# Patient Record
Sex: Female | Born: 1955
Health system: Southern US, Community
[De-identification: ages and names within clinical notes are randomized; demographics above are authoritative.]

## PROBLEM LIST (undated history)

## (undated) DIAGNOSIS — E039 Hypothyroidism, unspecified: Secondary | ICD-10-CM

## (undated) DIAGNOSIS — M7751 Other enthesopathy of right foot: Secondary | ICD-10-CM

## (undated) DIAGNOSIS — E0590 Thyrotoxicosis, unspecified without thyrotoxic crisis or storm: Secondary | ICD-10-CM

## (undated) DIAGNOSIS — K219 Gastro-esophageal reflux disease without esophagitis: Secondary | ICD-10-CM

## (undated) DIAGNOSIS — F32A Depression, unspecified: Secondary | ICD-10-CM

## (undated) DIAGNOSIS — T7840XA Allergy, unspecified, initial encounter: Secondary | ICD-10-CM

## (undated) DIAGNOSIS — F411 Generalized anxiety disorder: Secondary | ICD-10-CM

## (undated) DIAGNOSIS — K2970 Gastritis, unspecified, without bleeding: Secondary | ICD-10-CM

## (undated) DIAGNOSIS — F419 Anxiety disorder, unspecified: Secondary | ICD-10-CM

## (undated) DIAGNOSIS — J45909 Unspecified asthma, uncomplicated: Secondary | ICD-10-CM

## (undated) DIAGNOSIS — M1990 Unspecified osteoarthritis, unspecified site: Secondary | ICD-10-CM

## (undated) DIAGNOSIS — F329 Major depressive disorder, single episode, unspecified: Secondary | ICD-10-CM

## (undated) DIAGNOSIS — M2020 Hallux rigidus, unspecified foot: Secondary | ICD-10-CM

## (undated) HISTORY — DX: Allergy, unspecified, initial encounter: T78.40XA

## (undated) HISTORY — PX: TONSILLECTOMY: SUR1361

## (undated) HISTORY — DX: Other enthesopathy of right foot and ankle: M77.51

## (undated) HISTORY — PX: COLONOSCOPY: SHX174

## (undated) HISTORY — DX: Unspecified osteoarthritis, unspecified site: M19.90

## (undated) HISTORY — PX: ROTATOR CUFF REPAIR: SHX139

## (undated) HISTORY — DX: Depression, unspecified: F32.A

## (undated) HISTORY — DX: Anxiety disorder, unspecified: F41.9

## (undated) HISTORY — DX: Gastro-esophageal reflux disease without esophagitis: K21.9

## (undated) HISTORY — DX: Generalized anxiety disorder: F41.1

## (undated) HISTORY — DX: Major depressive disorder, single episode, unspecified: F32.9

## (undated) HISTORY — PX: BREAST SURGERY: SHX581

## (undated) HISTORY — DX: Gastritis, unspecified, without bleeding: K29.70

## (undated) HISTORY — DX: Hypothyroidism, unspecified: E03.9

## (undated) HISTORY — DX: Unspecified asthma, uncomplicated: J45.909

---

## 1992-05-06 HISTORY — PX: BREAST EXCISIONAL BIOPSY: SUR124

## 1998-01-05 ENCOUNTER — Ambulatory Visit (HOSPITAL_COMMUNITY): Admission: RE | Admit: 1998-01-05 | Discharge: 1998-01-05 | Payer: Self-pay | Admitting: Internal Medicine

## 1999-05-23 ENCOUNTER — Other Ambulatory Visit: Admission: RE | Admit: 1999-05-23 | Discharge: 1999-05-23 | Payer: Self-pay | Admitting: *Deleted

## 2000-01-11 ENCOUNTER — Ambulatory Visit (HOSPITAL_COMMUNITY): Admission: RE | Admit: 2000-01-11 | Discharge: 2000-01-11 | Payer: Self-pay | Admitting: Internal Medicine

## 2000-06-04 ENCOUNTER — Encounter: Admission: RE | Admit: 2000-06-04 | Discharge: 2000-06-04 | Payer: Self-pay | Admitting: Obstetrics and Gynecology

## 2000-06-04 ENCOUNTER — Encounter: Payer: Self-pay | Admitting: Obstetrics and Gynecology

## 2000-07-23 ENCOUNTER — Other Ambulatory Visit: Admission: RE | Admit: 2000-07-23 | Discharge: 2000-07-23 | Payer: Self-pay | Admitting: Obstetrics and Gynecology

## 2001-08-20 ENCOUNTER — Encounter: Payer: Self-pay | Admitting: Obstetrics and Gynecology

## 2001-08-20 ENCOUNTER — Encounter: Admission: RE | Admit: 2001-08-20 | Discharge: 2001-08-20 | Payer: Self-pay | Admitting: Obstetrics and Gynecology

## 2001-09-14 ENCOUNTER — Other Ambulatory Visit: Admission: RE | Admit: 2001-09-14 | Discharge: 2001-09-14 | Payer: Self-pay | Admitting: Obstetrics and Gynecology

## 2002-09-06 ENCOUNTER — Encounter: Admission: RE | Admit: 2002-09-06 | Discharge: 2002-09-06 | Payer: Self-pay | Admitting: Obstetrics and Gynecology

## 2002-09-06 ENCOUNTER — Encounter: Payer: Self-pay | Admitting: Obstetrics and Gynecology

## 2002-09-21 ENCOUNTER — Other Ambulatory Visit: Admission: RE | Admit: 2002-09-21 | Discharge: 2002-09-21 | Payer: Self-pay | Admitting: Obstetrics and Gynecology

## 2002-10-07 ENCOUNTER — Encounter: Payer: Self-pay | Admitting: Endocrinology

## 2002-10-07 ENCOUNTER — Encounter: Admission: RE | Admit: 2002-10-07 | Discharge: 2002-10-07 | Payer: Self-pay | Admitting: Endocrinology

## 2003-04-21 ENCOUNTER — Ambulatory Visit (HOSPITAL_COMMUNITY): Admission: RE | Admit: 2003-04-21 | Discharge: 2003-04-21 | Payer: Self-pay | Admitting: Family Medicine

## 2003-05-16 ENCOUNTER — Ambulatory Visit (HOSPITAL_BASED_OUTPATIENT_CLINIC_OR_DEPARTMENT_OTHER): Admission: RE | Admit: 2003-05-16 | Discharge: 2003-05-16 | Payer: Self-pay | Admitting: Orthopedic Surgery

## 2003-05-18 ENCOUNTER — Encounter: Admission: RE | Admit: 2003-05-18 | Discharge: 2003-08-09 | Payer: Self-pay | Admitting: Orthopedic Surgery

## 2003-09-06 ENCOUNTER — Ambulatory Visit (HOSPITAL_COMMUNITY): Admission: RE | Admit: 2003-09-06 | Discharge: 2003-09-06 | Payer: Self-pay | Admitting: Endocrinology

## 2004-02-01 ENCOUNTER — Other Ambulatory Visit: Admission: RE | Admit: 2004-02-01 | Discharge: 2004-02-01 | Payer: Self-pay | Admitting: Obstetrics and Gynecology

## 2004-03-03 ENCOUNTER — Ambulatory Visit (HOSPITAL_COMMUNITY): Admission: RE | Admit: 2004-03-03 | Discharge: 2004-03-03 | Payer: Self-pay | Admitting: Orthopedic Surgery

## 2004-03-12 ENCOUNTER — Encounter: Admission: RE | Admit: 2004-03-12 | Discharge: 2004-04-23 | Payer: Self-pay | Admitting: Orthopedic Surgery

## 2004-08-13 ENCOUNTER — Encounter: Admission: RE | Admit: 2004-08-13 | Discharge: 2004-08-13 | Payer: Self-pay | Admitting: Obstetrics and Gynecology

## 2005-03-01 ENCOUNTER — Other Ambulatory Visit: Admission: RE | Admit: 2005-03-01 | Discharge: 2005-03-01 | Payer: Self-pay | Admitting: Obstetrics and Gynecology

## 2005-03-18 ENCOUNTER — Ambulatory Visit: Payer: Self-pay | Admitting: Gastroenterology

## 2005-04-03 ENCOUNTER — Ambulatory Visit: Payer: Self-pay | Admitting: Internal Medicine

## 2005-04-05 ENCOUNTER — Ambulatory Visit (HOSPITAL_COMMUNITY): Admission: RE | Admit: 2005-04-05 | Discharge: 2005-04-05 | Payer: Self-pay | Admitting: Obstetrics and Gynecology

## 2005-05-06 DIAGNOSIS — M7751 Other enthesopathy of right foot: Secondary | ICD-10-CM

## 2005-05-06 HISTORY — DX: Other enthesopathy of right foot and ankle: M77.51

## 2006-03-07 ENCOUNTER — Emergency Department (HOSPITAL_COMMUNITY): Admission: EM | Admit: 2006-03-07 | Discharge: 2006-03-07 | Payer: Self-pay | Admitting: Family Medicine

## 2006-05-21 ENCOUNTER — Other Ambulatory Visit: Admission: RE | Admit: 2006-05-21 | Discharge: 2006-05-21 | Payer: Self-pay | Admitting: Obstetrics and Gynecology

## 2006-05-30 ENCOUNTER — Encounter: Admission: RE | Admit: 2006-05-30 | Discharge: 2006-05-30 | Payer: Self-pay | Admitting: Obstetrics and Gynecology

## 2007-05-20 ENCOUNTER — Other Ambulatory Visit: Admission: RE | Admit: 2007-05-20 | Discharge: 2007-05-20 | Payer: Self-pay | Admitting: Obstetrics and Gynecology

## 2007-07-14 ENCOUNTER — Ambulatory Visit: Payer: Self-pay | Admitting: Sports Medicine

## 2007-07-14 DIAGNOSIS — M76829 Posterior tibial tendinitis, unspecified leg: Secondary | ICD-10-CM

## 2007-07-14 DIAGNOSIS — M7750 Other enthesopathy of unspecified foot: Secondary | ICD-10-CM

## 2008-01-08 ENCOUNTER — Encounter: Admission: RE | Admit: 2008-01-08 | Discharge: 2008-01-08 | Payer: Self-pay | Admitting: Obstetrics and Gynecology

## 2008-05-25 ENCOUNTER — Other Ambulatory Visit: Admission: RE | Admit: 2008-05-25 | Discharge: 2008-05-25 | Payer: Self-pay | Admitting: Obstetrics and Gynecology

## 2008-10-18 ENCOUNTER — Encounter: Admission: RE | Admit: 2008-10-18 | Discharge: 2008-10-18 | Payer: Self-pay | Admitting: Obstetrics and Gynecology

## 2009-01-26 ENCOUNTER — Ambulatory Visit: Payer: Self-pay | Admitting: Family Medicine

## 2009-02-01 ENCOUNTER — Ambulatory Visit: Payer: Self-pay | Admitting: Family Medicine

## 2009-07-11 ENCOUNTER — Ambulatory Visit: Payer: Self-pay | Admitting: Family Medicine

## 2009-08-10 ENCOUNTER — Ambulatory Visit: Payer: Self-pay | Admitting: Family Medicine

## 2009-10-04 ENCOUNTER — Ambulatory Visit (HOSPITAL_COMMUNITY): Payer: Self-pay | Admitting: Marriage and Family Therapist

## 2009-10-16 ENCOUNTER — Ambulatory Visit: Payer: Self-pay | Admitting: Physician Assistant

## 2009-10-16 ENCOUNTER — Ambulatory Visit (HOSPITAL_COMMUNITY): Payer: Self-pay | Admitting: Marriage and Family Therapist

## 2009-10-23 ENCOUNTER — Ambulatory Visit (HOSPITAL_COMMUNITY): Payer: Self-pay | Admitting: Marriage and Family Therapist

## 2009-11-07 ENCOUNTER — Ambulatory Visit (HOSPITAL_COMMUNITY): Payer: Self-pay | Admitting: Marriage and Family Therapist

## 2009-11-14 ENCOUNTER — Ambulatory Visit (HOSPITAL_COMMUNITY): Payer: Self-pay | Admitting: Marriage and Family Therapist

## 2009-11-22 ENCOUNTER — Ambulatory Visit (HOSPITAL_COMMUNITY): Payer: Self-pay | Admitting: Marriage and Family Therapist

## 2009-12-13 ENCOUNTER — Ambulatory Visit (HOSPITAL_COMMUNITY): Payer: Self-pay | Admitting: Marriage and Family Therapist

## 2009-12-21 ENCOUNTER — Ambulatory Visit (HOSPITAL_COMMUNITY): Payer: Self-pay | Admitting: Marriage and Family Therapist

## 2010-01-04 ENCOUNTER — Ambulatory Visit (HOSPITAL_COMMUNITY): Payer: Self-pay | Admitting: Marriage and Family Therapist

## 2010-01-16 ENCOUNTER — Ambulatory Visit: Payer: Self-pay | Admitting: Physician Assistant

## 2010-01-24 ENCOUNTER — Encounter (INDEPENDENT_AMBULATORY_CARE_PROVIDER_SITE_OTHER): Payer: Self-pay | Admitting: *Deleted

## 2010-02-19 ENCOUNTER — Ambulatory Visit (HOSPITAL_COMMUNITY): Payer: Self-pay | Admitting: Marriage and Family Therapist

## 2010-02-27 ENCOUNTER — Ambulatory Visit (HOSPITAL_COMMUNITY): Payer: Self-pay | Admitting: Marriage and Family Therapist

## 2010-03-22 ENCOUNTER — Ambulatory Visit (HOSPITAL_COMMUNITY): Payer: Self-pay | Admitting: Marriage and Family Therapist

## 2010-04-04 ENCOUNTER — Ambulatory Visit: Payer: Self-pay | Admitting: Family Medicine

## 2010-05-26 ENCOUNTER — Encounter: Payer: Self-pay | Admitting: Family Medicine

## 2010-05-28 ENCOUNTER — Encounter: Payer: Self-pay | Admitting: Obstetrics and Gynecology

## 2010-06-05 NOTE — Letter (Signed)
Summary: Colonoscopy Date Change Letter  [facility]  [address] [place], [place] [address]   Phone: [phone]  Fax: [phone]      January 24, 2010 MRN: [id number]   [facility] [address] [place], [place]  [address]   Dear Ms. Gavidia,   Previously you were recommended to have a repeat colonoscopy around this time. Your chart was recently reviewed by Dr. Lina Sar of [facility]. Follow up colonoscopy is now recommended in November 2013. This revised recommendation is based on current, nationally recognized guidelines for colorectal cancer screening and polyp surveillance. These guidelines are endorsed by the American Cancer Society, [employer] on Colorectal Cancer as well as numerous other major medical organizations.  Please understand that our recommendation assumes that you do not have any new symptoms such as bleeding, a change in bowel habits, anemia, or significant abdominal discomfort. If you do have any concerning GI symptoms or want to discuss the guideline recommendations, please call to arrange an office visit at your earliest convenience. Otherwise we will keep you in our reminder system and contact you 1-2 months prior to the date listed above to schedule your next colonoscopy.  Thank you,  Hedwig Morton. Juanda Chance, M.D.  [facility] [phone]

## 2010-08-22 ENCOUNTER — Ambulatory Visit (INDEPENDENT_AMBULATORY_CARE_PROVIDER_SITE_OTHER): Payer: Commercial Managed Care - PPO | Admitting: Family Medicine

## 2010-08-22 DIAGNOSIS — F411 Generalized anxiety disorder: Secondary | ICD-10-CM

## 2010-09-21 NOTE — Op Note (Signed)
NAME:  Theresa Schneider, Theresa Schneider                          ACCOUNT NO.:  [id number]   MEDICAL RECORD NO.:  [id number]                   PATIENT TYPE:  AMB   LOCATION:  DSC                                  FACILITY:  [facility]   PHYSICIAN:  Robert A. Thurston Hole, M.D.              DATE OF BIRTH:  January 27, 1956   DATE OF PROCEDURE:  05/16/2003  DATE OF DISCHARGE:                                 OPERATIVE REPORT   PREOPERATIVE DIAGNOSIS:  Right shoulder rotator cuff tear with partial  glenoid labrum tear and impingement.   POSTOPERATIVE DIAGNOSIS:  Right shoulder rotator cuff tear with partial  glenoid labrum tear and impingement.   PROCEDURE:  1. Right shoulder exam under anesthesia followed  by arthroscopic partial     labrum tear debridement.  2. Right shoulder arthroscopically assisted rotator cuff repair using     Arthrex suture anchor x1.  3. Right shoulder subacromial decompression.   SURGEON:  Elana Alm. Thurston Hole, M.D.   ASSISTANT:  Julien Girt, P.A.   ANESTHESIA:  General.   OPERATIVE TIME:  50 minutes.   COMPLICATIONS:  None.   INDICATIONS FOR PROCEDURE:  Theresa Schneider is a 55 year old woman who has had 6  months of increasing  right shoulder pain with examination  and MRI  documenting a partial versus complete rotator cuff tear, who has failed  conservative care and is now to undergo arthroscopy.   DESCRIPTION OF PROCEDURE:  Theresa Schneider is brought to the operating room on  May 16, 2003, after an interscalene block had been placed in the holding  area by anesthesia for postoperative pain control. She was placed on the  operating table in the supine position. After being placed under general  anesthesia, her right shoulder was examined under anesthesia. She had full  range of motion and her shoulder was stable on ligamentous examination. She  was then placed in a beachchair position and her shoulder and arm were  prepped using sterile Duraprep and draped using sterile technique.   Initially the arthroscopy was performed through a posterior arthroscopic  portal. The arthroscope with a pump attachment was placed and through and  anterior portal, an arthroscopic probe was placed. On initial inspection the  articular cartilage and the glenohumeral joint were intact. the anterior  labrum had a partial tear as well as the posterior labrum, partial tear 25%  which was debrided. The superior labrum, biceps tendon anchor were intact.  The biceps tendon was intact. The inferior labrum and anterior inferior  glenohumeral ligament complex were intact. The rotator cuff on the articular  side did not show evidence of a tear. The inferior capsular recess was free  of pathology.   The subacromial space was entered and a lateral arthroscopic portal was  made. A large amount of bursitis was resected. Impingement was noted and a  subacromial decompression was carried out, removing 6 mm of the undersurface  of the  anterior, anterolateral and anteromedial acromion and CA ligament  release carried out as well. The acromioclavicular joint was not disturbed.   The rotator cuff showed a complete tear on the bursal surface, and this was  partially debrided and then repaired arthroscopically with 1 Arthrex suture  anchor placed through an accessory portal. Each of the sutures in the anchor  was arthroscopically passed through the tear in the rotator cuff and then  arthroscopically tied down, thus repairing the cuff completely back down to  the greater tuberosity. The shoulder could be brought through a full range  of motion with no impingement on the repair after this was done.   At this point it was felt that all pathology had been satisfactorily  addressed. The instruments were removed. The portal was closed with 3-0  nylon suture. Sterile dressings and a sling were applied. The patient was  awakened and taken to the recovery room in stable condition.   FOLLOW UP:  Theresa Schneider will be  followed  as an outpatient on Percocet  and  Naprosyn with early physical therapy with passive range of motion only for 6  weeks, then active thereafter. I will see her back in the office in one week  for sutures out and follow up.                                               Robert A. Thurston Hole, M.D.    RAW/MEDQ  D:  05/16/2003  T:  05/16/2003  Job:  [id number]

## 2011-01-03 ENCOUNTER — Other Ambulatory Visit: Payer: Self-pay | Admitting: Obstetrics and Gynecology

## 2011-01-03 DIAGNOSIS — Z1231 Encounter for screening mammogram for malignant neoplasm of breast: Secondary | ICD-10-CM

## 2011-01-16 ENCOUNTER — Ambulatory Visit
Admission: RE | Admit: 2011-01-16 | Discharge: 2011-01-16 | Disposition: A | Discharge status: Home / Self Care | Payer: Commercial Managed Care - PPO | Source: Ambulatory Visit | Attending: Obstetrics and Gynecology | Admitting: Obstetrics and Gynecology

## 2011-01-16 ENCOUNTER — Telehealth: Payer: Self-pay | Admitting: Family Medicine

## 2011-01-16 DIAGNOSIS — Z1231 Encounter for screening mammogram for malignant neoplasm of breast: Secondary | ICD-10-CM

## 2011-01-16 DIAGNOSIS — E0500 Thyrotoxicosis with diffuse goiter without thyrotoxic crisis or storm: Secondary | ICD-10-CM

## 2011-01-16 MED ORDER — SYNTHROID 88 MCG PO TABS: 88.0000 ug | ORAL_TABLET | Freq: Every day | ORAL | Status: DC

## 2011-01-17 ENCOUNTER — Telehealth: Payer: Self-pay | Admitting: Family Medicine

## 2011-01-17 NOTE — Telephone Encounter (Signed)
Received refill request from [facility] for pt's liothyronine sodium 25 mcg, Dr.Knapp did not authorize refill as Dr.South rx'd this medication and she should be under the care of him for her hypothyroidism. Left message for patient regarding this info. She has an appt scheduled with Dr.Knapp in October-if this appt is only for thyroid concerns she should schedule appt with Dr.South as we do not do her thyroid care per Dr.Knapp.

## 2011-01-17 NOTE — Telephone Encounter (Signed)
She has been under the care of Dr. Evlyn Kanner for her hypothyroidism.  We don't Rx her thyroid meds, and this one wasn't even listed at her last OV in chart (08/2010). Refill not authorized

## 2011-01-17 NOTE — Telephone Encounter (Signed)
Dr.Knapp this refill request was in my inbox. Patient is scheduled for appt with you 02/20/2011. I do not see this med listed in her med list, so I was unsure if it was ok to fill or not. Thanks.

## 2011-01-23 ENCOUNTER — Ambulatory Visit (INDEPENDENT_AMBULATORY_CARE_PROVIDER_SITE_OTHER): Payer: Commercial Managed Care - PPO | Admitting: Family Medicine

## 2011-01-23 ENCOUNTER — Encounter: Payer: Self-pay | Admitting: Family Medicine

## 2011-01-23 VITALS — BP 110/70 | HR 73 | Wt 121.0 lb

## 2011-01-23 DIAGNOSIS — E0500 Thyrotoxicosis with diffuse goiter without thyrotoxic crisis or storm: Secondary | ICD-10-CM

## 2011-01-23 DIAGNOSIS — Z79899 Other long term (current) drug therapy: Secondary | ICD-10-CM

## 2011-01-23 DIAGNOSIS — E039 Hypothyroidism, unspecified: Secondary | ICD-10-CM

## 2011-01-23 LAB — CBC WITH DIFFERENTIAL/PLATELET
Eosinophils Absolute: 0.1 10*3/uL (ref 0.0–0.7)
Eosinophils Relative: 1 % (ref 0–5)
Hemoglobin: 13.5 g/dL (ref 12.0–15.0)
MCH: 28.4 pg (ref 26.0–34.0)
MCHC: 32.1 g/dL (ref 30.0–36.0)
Neutro Abs: 4.9 10*3/uL (ref 1.7–7.7)
Platelets: 372 10*3/uL (ref 150–400)
RBC: 4.76 MIL/uL (ref 3.87–5.11)
RDW: 13.3 % (ref 11.5–15.5)
WBC: 8.4 10*3/uL (ref 4.0–10.5)

## 2011-01-23 LAB — COMPREHENSIVE METABOLIC PANEL
ALT: 18 U/L (ref 0–35)
AST: 25 U/L (ref 0–37)
Glucose, Bld: 78 mg/dL (ref 70–99)
Potassium: 4.6 mEq/L (ref 3.5–5.3)
Sodium: 139 mEq/L (ref 135–145)
Total Bilirubin: 0.4 mg/dL (ref 0.3–1.2)
Total Protein: 6.6 g/dL (ref 6.0–8.3)

## 2011-01-23 LAB — LIPID PANEL
Cholesterol: 194 mg/dL (ref 0–200)
HDL: 73 mg/dL (ref >39)
Total CHOL/HDL Ratio: 2.7 Ratio
Triglycerides: 67 mg/dL (ref <150)
VLDL: 13 mg/dL (ref 0–40)

## 2011-01-23 NOTE — Progress Notes (Signed)
  Subjective:    Patient ID: Theresa Schneider, female    DOB: Jul 10, 1955, 55 y.o.   MRN: [id number]  HPI She is here for a consultation. She has a history of Graves' disease dating back several decades. She has been on thyroid replacement however has not had any of this checked in several years. She has been taking care of her husband who has been dealing with cancer. She has no particular concerns or complaints. 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   Review of Systems     Objective:   Physical Exam alert and in no distress. Tympanic membranes and canals are normal. Throat is clear. Tonsils are normal. Neck is supple without adenopathy or thyromegaly. Cardiac exam shows a regular sinus rhythm without murmurs or gallops. Lungs are clear to auscultation. Skin is normal. DTRs are normal.       Assessment & Plan:   1. Hypothyroid  CBC w/Diff, Comprehensive metabolic panel, Lipid Profile, TSH  2. Encounter for long-term (current) use of other medications  CBC w/Diff, Comprehensive metabolic panel, Lipid Profile, TSH   I will place her back on Synthroid based on the blood work. She will get a flu shot at work.

## 2011-01-24 ENCOUNTER — Telehealth: Payer: Self-pay

## 2011-01-24 MED ORDER — LEVOTHYROXINE SODIUM 88 MCG PO TABS: 88.0000 ug | ORAL_TABLET | Freq: Every day | ORAL | Status: DC

## 2011-01-24 NOTE — Telephone Encounter (Signed)
Called pt to inform her of labs

## 2011-01-24 NOTE — Progress Notes (Signed)
Addended by: Ronnald Nian on: 01/24/2011 01:22 PM   Modules accepted: Orders

## 2011-01-29 NOTE — Telephone Encounter (Signed)
DONE

## 2011-02-19 ENCOUNTER — Encounter: Payer: Self-pay | Admitting: Family Medicine

## 2011-02-20 ENCOUNTER — Encounter: Payer: Self-pay | Admitting: Family Medicine

## 2011-02-20 ENCOUNTER — Ambulatory Visit (INDEPENDENT_AMBULATORY_CARE_PROVIDER_SITE_OTHER): Payer: Commercial Managed Care - PPO | Admitting: Family Medicine

## 2011-02-20 DIAGNOSIS — F411 Generalized anxiety disorder: Secondary | ICD-10-CM

## 2011-02-20 DIAGNOSIS — M810 Age-related osteoporosis without current pathological fracture: Secondary | ICD-10-CM | POA: Insufficient documentation

## 2011-02-20 DIAGNOSIS — E039 Hypothyroidism, unspecified: Secondary | ICD-10-CM

## 2011-02-20 DIAGNOSIS — E890 Postprocedural hypothyroidism: Secondary | ICD-10-CM | POA: Insufficient documentation

## 2011-02-20 LAB — VITAMIN D 25 HYDROXY (VIT D DEFICIENCY, FRACTURES): Vit D, 25-Hydroxy: 55 ng/mL (ref 30–89)

## 2011-02-20 NOTE — Patient Instructions (Addendum)
Keep up the regular exercise.  We will contact you with your thyroid results.  If we are not changing the dose, then we can recheck in 3-4 months.  If we change the dose, it is important to have blood tests repeated in 6 weeks, to make sure the dose adjustment is correct.  Be consistent with the Lexapro dose.  Have the pharmacy contact us when you need refill

## 2011-02-20 NOTE — Progress Notes (Signed)
Patient presents for follow up on her thyroid medications.  The Cytomel was stopped about 6 weeks ago, Synthroid was maintained at same dose, and she presents for re-check of labs.  Has been very busy with daughter's recent wedding.  Feels a little tired overall.  Denies any skin or hair changes.  Has been dieting because of the wedding--weight loss was intentional.  No bowel changes.  Husband's cancer has been doing better, although he has lost a lot of weight.  Feels better and sleeping better overall.  Increased the Lexapro to 15mg  right before the wedding, otherwise mostly has been taking 10mg  (occasionally 15), and moods have been good.  Sometimes feels sleepy in the mornings--? From medication vs from thyroid.  Up at least twice a night to void (drinks a lot of water, late in the day).  Still tired, even with drinking a lot of coffee.  Recently started exercising again, and exercise has helped her energy some, but still more fatigued than she normally feels.  Dr. Tresa Res would like a PTH level checked.  She has osteoporosis and is considering Prolia injections every 6 months. She brings in Rx to get labs drawn for PTH.  Past Medical History  Diagnosis Date  . GAD (generalized anxiety disorder)   . Grave's disease     s/p I-131 treatment  . Allergy     SEASONAL/PERENNIAL  . Osteoporosis 2009  . Hypothyroidism     (since I-131 treatment)    Past Surgical History  Procedure Date  . Tonsillectomy   . Rotator cuff repair     History   Social History  . Marital Status: Married    Spouse Name: N/A    Number of Children: N/A  . Years of Education: N/A   Occupational History  . Not on file.   Social History Main Topics  . Smoking status: Never Smoker   . Smokeless tobacco: Never Used  . Alcohol Use: Yes  . Drug Use: No  . Sexually Active: Not on file   Other Topics Concern  . 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 Arthritis Mother     OSTEO    Current outpatient prescriptions:b complex vitamins tablet, Take 2 tablets by mouth daily.  , Disp: , Rfl: ;  calcium-vitamin D (OSCAL WITH D) 500-200 MG-UNIT per tablet, Take 1 tablet by mouth 2 (two) times daily.  , Disp: , Rfl: ;  cholecalciferol (VITAMIN D) 1000 UNITS tablet, Take 1,000 Units by mouth 2 (two) times daily.  , Disp: , Rfl: ;  escitalopram (LEXAPRO) 10 MG tablet, Take 10 mg by mouth daily.  , Disp: , Rfl:  levothyroxine (SYNTHROID) 88 MCG tablet, Take 1 tablet (88 mcg total) by mouth daily., Disp: 90 tablet, Rfl: 3;  Multiple Vitamins-Minerals (MULTIVITAMIN WITH MINERALS) tablet, Take 1 tablet by mouth 2 (two) times daily.  , Disp: , Rfl:   Allergies  Allergen Reactions  . Codeine Phosphate    ROS:  See HPI.  Denies fevers, URI symptoms, headaches, chest pain.  + fatigue.  Denies bowel changes, skin/hair changes.  Slight intentional weight loss.  Denies joint pains, skin rash or other concerns  PHYSICAL EXAM: BP 130/80  Pulse 72  Ht 5\' 2"  (1.575 m)  Wt 124 lb (56.246 kg)  BMI 22.68 kg/m2 Well developed, pleasant female in no distress HEENT: PERRL, EOMI, conjunctiva  clear Neck: no lymphadenopathy, thyromegaly Heart: regular rate and rhythm without murmur Lungs: clear bilaterally Extremities: no edema Skin: no rash Psych: normal mood, affect, hygiene and grooming  ASSESSMENT/PLAN:  1. Unspecified hypothyroidism  TSH  2. Osteoporosis  Vitamin D 25 hydroxy, PTH, Intact and Calcium  3. Anxiety state, unspecified     Hypothyroidism--having significant fatigue.  She prefers to increase thyroid dose (even if TSH is within normal range, if there is "wiggle room"), as long as not making her hyperthyroid and putting her bones at risk. Await TSH results to determine dose.  Osteoporosis--previously took Rx Vitamin D (years ago) for low Vitamin D levels.  Taking supplements now (2000 IU daily)--hasn't  had it checked in a while.  Re-check today, along with the PTH requested by Dr. Tresa Res  Depression/anxiety--overall doing well. Discussed the importance of maintaining a consistent dose of Lexapro.  Pharmacy to contact us when refill is needed.  Plans to get flu shot at hospital next week

## 2011-02-21 ENCOUNTER — Telehealth: Payer: Self-pay | Admitting: Family Medicine

## 2011-02-21 ENCOUNTER — Other Ambulatory Visit: Payer: Self-pay | Admitting: Family Medicine

## 2011-02-21 DIAGNOSIS — R7989 Other specified abnormal findings of blood chemistry: Secondary | ICD-10-CM

## 2011-02-21 DIAGNOSIS — E079 Disorder of thyroid, unspecified: Secondary | ICD-10-CM

## 2011-02-21 MED ORDER — LEVOTHYROXINE SODIUM 112 MCG PO TABS: 112.0000 ug | ORAL_TABLET | Freq: Every day | ORAL | Status: DC

## 2011-02-21 NOTE — Telephone Encounter (Signed)
dt ?

## 2011-02-21 NOTE — Progress Notes (Signed)
Phone pt work & Scientist, research (medical) re labs

## 2011-02-21 NOTE — Progress Notes (Signed)
Faxed labs to Dr. Tresa Res

## 2011-05-06 ENCOUNTER — Other Ambulatory Visit: Payer: Self-pay | Admitting: Family Medicine

## 2011-05-07 DIAGNOSIS — K2970 Gastritis, unspecified, without bleeding: Secondary | ICD-10-CM

## 2011-05-07 HISTORY — DX: Gastritis, unspecified, without bleeding: K29.70

## 2011-06-10 ENCOUNTER — Other Ambulatory Visit: Payer: Self-pay | Admitting: Family Medicine

## 2011-06-10 ENCOUNTER — Encounter: Payer: Self-pay | Admitting: Family Medicine

## 2011-06-10 ENCOUNTER — Ambulatory Visit (INDEPENDENT_AMBULATORY_CARE_PROVIDER_SITE_OTHER): Payer: Commercial Managed Care - PPO | Admitting: Family Medicine

## 2011-06-10 VITALS — BP 112/72 | HR 64 | Ht 62.0 in | Wt 127.0 lb

## 2011-06-10 DIAGNOSIS — F411 Generalized anxiety disorder: Secondary | ICD-10-CM

## 2011-06-10 DIAGNOSIS — R51 Headache: Secondary | ICD-10-CM

## 2011-06-10 DIAGNOSIS — E039 Hypothyroidism, unspecified: Secondary | ICD-10-CM

## 2011-06-10 DIAGNOSIS — F458 Other somatoform disorders: Secondary | ICD-10-CM

## 2011-06-10 MED ORDER — CITALOPRAM HYDROBROMIDE 20 MG PO TABS: 20.0000 mg | ORAL_TABLET | Freq: Every day | ORAL | Status: DC

## 2011-06-10 NOTE — Progress Notes (Signed)
Subjective:    Patient ID: Theresa Schneider, female    DOB: 07-18-1955, 56 y.o.   MRN: [id number]  Chief complaint: currently taking Lexapro and needs to change due to insurance (has list of preferred). She has frequent HA's, think Lexapro made them worse-would like to make sure that alternative does not make her gain weight or cause HA as side effect  HPI  She was started on Lexapro around the time that her husband was diagnosed with cancer, approx 02/2010.  Moods have been okay.  Now needs to change for insurance/formulary reasons.  Saw Dr. Lucianne Muss when she wasn't feeling well, related to her thyroid.  She felt very badly during the transition from Cytomel to Synthroid, with mainly fatigue. She reports that she had TSH checked, and was told that she should continue the dose of 112 mcg.  This was just around Christmas.  She doesn't plan to follow up there.  Feeling better now.    She is short-staffed at work, waking up a few nights/week and working in the middle of the night (waking at 2:30 and working the rest of the night). Sometimes goes to bed as early at 8:30, but admits that she isn't getting enough sleep.  Having headaches daily x 2 months. Originally thought it was related to stress, weather changes, but now has them most days. Sleep pattern has changed--going to bed earlier, but waking up early to get work done.  Thinks she isn't getting enough sleep.  No longer getting exercise because she is too busy, working too much.  Her headaches are at both temples, and across forehead and behind eyes. Can't remember if she had eye exam last year.  Sometimes wake up with headache, lowgrade, most days.  Wakes up with headache, sometimes stays lowgrade throughout the day, other times it gets worse during the day.  Takes BC powder and it helps most of the time, but doesn't take it every day. She denies allergy symptoms, but taking allergy shots. +grinds teeth, wears nightguard at night.  Some nausea with  the headaches, no vomiting; denies visual changes  Past Medical History  Diagnosis Date  . GAD (generalized anxiety disorder)   . Grave's disease     s/p I-131 treatment  . Allergy     SEASONAL/PERENNIAL  . Osteoporosis 2009  . Hypothyroidism     (since I-131 treatment)    Past Surgical History  Procedure Date  . Tonsillectomy   . Rotator cuff repair     History   Social History  . Marital Status: Married    Spouse Name: N/A    Number of Children: N/A  . Years of Education: N/A   Occupational History  . Not on file.   Social History Main Topics  . Smoking status: Never Smoker   . Smokeless tobacco: Never Used  . Alcohol Use: Yes  . Drug Use: No  . Sexually Active: Not on file   Other Topics Concern  . 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 Arthritis Mother     OSTEO   Current Outpatient Prescriptions on File Prior to Visit  Medication Sig Dispense Refill  . calcium-vitamin D (OSCAL WITH D) 500-200 MG-UNIT per tablet Take 1 tablet by mouth 2 (two) times daily.        . cholecalciferol (VITAMIN D) 1000 UNITS tablet Take 1,000 Units by mouth  2 (two) times daily.        . Multiple Vitamins-Minerals (MULTIVITAMIN WITH MINERALS) tablet Take 1 tablet by mouth 2 (two) times daily.        Marland Kitchen SYNTHROID 112 MCG tablet TAKE 1 TABLET BY MOUTH DAILY  30 tablet  0  . b complex vitamins tablet Take 2 tablets by mouth daily.          Allergies  Allergen Reactions  . Codeine Phosphate Nausea And Vomiting   Review of Systems Denies fevers, diarrhea, rare constipation.  Energy is okay.  No hair loss.  No weight changes.  Denies chest pain, shortness of breath. See HPI     Objective:   Physical Exam BP 112/72  Pulse 64  Ht 5\' 2"  (1.575 m)  Wt 127 lb (57.607 kg)  BMI 23.23 kg/m2 Well developed, pleasant female, in no distress HEENT:  PERRL, EOMI, conjunctiva clear.     Tender bilateral temporalis muscles.  Temporal arteries nontender.  Sinuses nontender Neck: no lymphadenopathy or mass Psych: normal mood, affect, hygiene and grooming Skin: no rash      Assessment & Plan:   1. Anxiety state, unspecified  citalopram (CELEXA) 20 MG tablet  2. Headache    3. Bruxism (teeth grinding)    4. Unspecified hypothyroidism      Depression--change to celexa.  Reviewed possible side effects, possibility that it may not be as effective, possibly needing to increase dose in future. Likely will have no problems with the change  Headaches--contributed by stress and teeth grinding.  Trial of Aleve, heat, stress reduction techniques, regular exercise.  Hypothyroidism:  She will get lab results from Dr. Lucianne Muss, to help determine when TSH needs to be repeated here again

## 2011-06-10 NOTE — Patient Instructions (Addendum)
Headaches--contributed by stress and teeth grinding.  Trial of Aleve, heat, stress reduction techniques.  Try and limit coffee to 1-2 cups daily, and ensure getting at least 7 hours of sleep nightly.  Drink plenty of non-caffeinated beverages.  Please try and send copies of labs from Dr. Lucianne Muss, so that we can figure out when you are due to have TSH checked again.  Follow up in 4 weeks if ongoing problems with headaches and/or moods, to discuss potentially changing medications.

## 2011-07-05 ENCOUNTER — Telehealth: Payer: Self-pay | Admitting: Internal Medicine

## 2011-07-05 DIAGNOSIS — E039 Hypothyroidism, unspecified: Secondary | ICD-10-CM

## 2011-07-05 MED ORDER — SYNTHROID 112 MCG PO TABS: 112.0000 ug | ORAL_TABLET | Freq: Every day | ORAL | Status: DC

## 2011-07-05 NOTE — Telephone Encounter (Signed)
done

## 2011-10-14 ENCOUNTER — Other Ambulatory Visit (HOSPITAL_COMMUNITY): Payer: Self-pay | Admitting: Orthopedic Surgery

## 2011-10-23 ENCOUNTER — Encounter (HOSPITAL_COMMUNITY): Payer: Self-pay | Admitting: Pharmacy Technician

## 2011-10-29 NOTE — Pre-Procedure Instructions (Signed)
20 SAMORIA FEDORKO  10/29/2011   Your procedure is scheduled on:  July 3rd  Report to [facility] at 0630 AM.  Call this number if you have problems the morning of surgery: [phone]   Remember:   Do not eat food or drink:After Midnight.  Take these medicines the morning of surgery with A SIP OF WATER: albuterol, synthroid   Do not wear jewelry, make-up or nail polish.  Do not wear lotions, powders, or perfumes.  Do not shave 48 hours prior to surgery. Men may shave face and neck.  Do not bring valuables to the hospital.  Contacts, dentures or bridgework may not be worn into surgery.  Leave suitcase in the car. After surgery it may be brought to your room.  For patients admitted to the hospital, checkout time is 11:00 AM the day of discharge.   Patients discharged the day of surgery will not be allowed to drive home.  Special Instructions: CHG Shower Use Special Wash: 1/2 bottle night before surgery and 1/2 bottle morning of surgery.   Please read over the following fact sheets that you were given: Pain Booklet, Coughing and Deep Breathing, MRSA Information and Surgical Site Infection Prevention

## 2011-10-30 ENCOUNTER — Inpatient Hospital Stay (HOSPITAL_COMMUNITY)
Admission: RE | Admit: 2011-10-30 | Discharge: 2011-10-30 | Payer: Commercial Managed Care - PPO | Source: Ambulatory Visit

## 2011-10-31 ENCOUNTER — Encounter (HOSPITAL_COMMUNITY)
Admission: RE | Admit: 2011-10-31 | Discharge: 2011-10-31 | Disposition: A | Discharge status: Home / Self Care | Payer: 59 | Source: Ambulatory Visit | Attending: Orthopedic Surgery | Admitting: Orthopedic Surgery

## 2011-10-31 ENCOUNTER — Encounter (HOSPITAL_COMMUNITY): Payer: Self-pay

## 2011-10-31 HISTORY — DX: Thyrotoxicosis, unspecified without thyrotoxic crisis or storm: E05.90

## 2011-10-31 HISTORY — DX: Hallux rigidus, unspecified foot: M20.20

## 2011-10-31 LAB — CBC
HCT: 40.8 % (ref 36.0–46.0)
Hemoglobin: 13.5 g/dL (ref 12.0–15.0)
MCHC: 33.1 g/dL (ref 30.0–36.0)
MCV: 89.5 fL (ref 78.0–100.0)
RDW: 13.6 % (ref 11.5–15.5)

## 2011-10-31 LAB — COMPREHENSIVE METABOLIC PANEL
Alkaline Phosphatase: 54 U/L (ref 39–117)
BUN: 13 mg/dL (ref 6–23)
Chloride: 104 mEq/L (ref 96–112)
Creatinine, Ser: 0.69 mg/dL (ref 0.50–1.10)
GFR calc Af Amer: >90 mL/min (ref >90)
Glucose, Bld: 82 mg/dL (ref 70–99)
Potassium: 4.1 mEq/L (ref 3.5–5.1)
Total Bilirubin: 0.3 mg/dL (ref 0.3–1.2)
Total Protein: 7.4 g/dL (ref 6.0–8.3)

## 2011-10-31 LAB — PROTIME-INR
INR: 0.99 (ref 0.00–1.49)
Prothrombin Time: 13.3 seconds (ref 11.6–15.2)

## 2011-10-31 LAB — APTT: aPTT: 39 seconds — ABNORMAL HIGH (ref 24–37)

## 2011-10-31 NOTE — Progress Notes (Signed)
Last ekg 15 yrs ago

## 2011-10-31 NOTE — Progress Notes (Signed)
Occ. Dysrhythmia with graves disease. Pt stated resolved now.  Will do ekg

## 2011-11-05 MED ORDER — CEFAZOLIN SODIUM-DEXTROSE 2-3 GM-% IV SOLR
2.0000 g | INTRAVENOUS | Status: AC
  Administered 2011-11-06: 2 g via INTRAVENOUS
  Filled 2011-11-05: qty 50

## 2011-11-06 ENCOUNTER — Encounter (HOSPITAL_COMMUNITY)
Admission: RE | Disposition: A | Discharge status: Home / Self Care | Payer: Self-pay | Source: Ambulatory Visit | Attending: Orthopedic Surgery

## 2011-11-06 ENCOUNTER — Encounter (HOSPITAL_COMMUNITY): Payer: Self-pay | Admitting: Anesthesiology

## 2011-11-06 ENCOUNTER — Ambulatory Visit (HOSPITAL_COMMUNITY)
Admission: RE | Admit: 2011-11-06 | Discharge: 2011-11-06 | Disposition: A | Discharge status: Home / Self Care | Payer: 59 | Source: Ambulatory Visit | Attending: Orthopedic Surgery | Admitting: Orthopedic Surgery

## 2011-11-06 ENCOUNTER — Ambulatory Visit (HOSPITAL_COMMUNITY): Payer: 59 | Admitting: Anesthesiology

## 2011-11-06 DIAGNOSIS — M2020 Hallux rigidus, unspecified foot: Secondary | ICD-10-CM | POA: Insufficient documentation

## 2011-11-06 DIAGNOSIS — Z01812 Encounter for preprocedural laboratory examination: Secondary | ICD-10-CM | POA: Insufficient documentation

## 2011-11-06 DIAGNOSIS — Z01810 Encounter for preprocedural cardiovascular examination: Secondary | ICD-10-CM | POA: Insufficient documentation

## 2011-11-06 HISTORY — PX: OTHER SURGICAL HISTORY: SHX169

## 2011-11-06 SURGERY — AMPUTATION DIGIT
Anesthesia: General | Site: Foot | Laterality: Right | Wound class: Clean

## 2011-11-06 MED ORDER — ONDANSETRON HCL 4 MG/2ML IJ SOLN
INTRAMUSCULAR | Status: DC | PRN
  Administered 2011-11-06: 4 mg via INTRAVENOUS

## 2011-11-06 MED ORDER — OXYCODONE HCL 5 MG PO TABS: 5.0000 mg | ORAL_TABLET | Freq: Once | ORAL | Status: DC | PRN

## 2011-11-06 MED ORDER — HYDROCODONE-ACETAMINOPHEN 5-500 MG PO TABS
1.0000 | ORAL_TABLET | Freq: Four times a day (QID) | ORAL | Status: AC | PRN
Start: 2011-11-06 — End: 2011-11-16

## 2011-11-06 MED ORDER — MIDAZOLAM HCL 2 MG/2ML IJ SOLN: 0.5000 mg | INTRAMUSCULAR | Status: DC | PRN

## 2011-11-06 MED ORDER — FENTANYL CITRATE 0.05 MG/ML IJ SOLN: 50.0000 ug | INTRAMUSCULAR | Status: DC | PRN

## 2011-11-06 MED ORDER — DEXAMETHASONE SODIUM PHOSPHATE 4 MG/ML IJ SOLN
INTRAMUSCULAR | Status: DC | PRN
  Administered 2011-11-06: 4 mg via INTRAVENOUS

## 2011-11-06 MED ORDER — PROPOFOL 10 MG/ML IV EMUL
INTRAVENOUS | Status: DC | PRN
  Administered 2011-11-06: 200 mg via INTRAVENOUS

## 2011-11-06 MED ORDER — HYDROMORPHONE HCL PF 1 MG/ML IJ SOLN: 0.2500 mg | INTRAMUSCULAR | Status: DC | PRN

## 2011-11-06 MED ORDER — 0.9 % SODIUM CHLORIDE (POUR BTL) OPTIME
TOPICAL | Status: DC | PRN
  Administered 2011-11-06: 1000 mL

## 2011-11-06 MED ORDER — DROPERIDOL 2.5 MG/ML IJ SOLN
INTRAMUSCULAR | Status: DC | PRN
  Administered 2011-11-06: 0.625 mg via INTRAVENOUS

## 2011-11-06 MED ORDER — ACETAMINOPHEN 10 MG/ML IV SOLN
INTRAVENOUS | Status: DC | PRN
  Administered 2011-11-06: 1000 mg via INTRAVENOUS

## 2011-11-06 MED ORDER — ARTIFICIAL TEARS OP OINT
TOPICAL_OINTMENT | OPHTHALMIC | Status: DC | PRN
  Administered 2011-11-06: 1 via OPHTHALMIC

## 2011-11-06 MED ORDER — LIDOCAINE HCL (PF) 1 % IJ SOLN
INTRAMUSCULAR | Status: AC
  Filled 2011-11-06: qty 30

## 2011-11-06 MED ORDER — LACTATED RINGERS IV SOLN
INTRAVENOUS | Status: DC | PRN
  Administered 2011-11-06: 08:00:00 via INTRAVENOUS

## 2011-11-06 MED ORDER — FENTANYL CITRATE 0.05 MG/ML IJ SOLN
INTRAMUSCULAR | Status: DC | PRN
  Administered 2011-11-06 (×2): 25 ug via INTRAVENOUS
  Administered 2011-11-06 (×2): 50 ug via INTRAVENOUS

## 2011-11-06 MED ORDER — LIDOCAINE HCL (CARDIAC) 20 MG/ML IV SOLN
INTRAVENOUS | Status: DC | PRN
  Administered 2011-11-06: 50 mg via INTRAVENOUS

## 2011-11-06 MED ORDER — ACETAMINOPHEN 10 MG/ML IV SOLN
INTRAVENOUS | Status: AC
  Filled 2011-11-06: qty 100

## 2011-11-06 MED ORDER — MIDAZOLAM HCL 5 MG/5ML IJ SOLN
INTRAMUSCULAR | Status: DC | PRN
  Administered 2011-11-06: 2 mg via INTRAVENOUS

## 2011-11-06 MED ORDER — METOCLOPRAMIDE HCL 5 MG/ML IJ SOLN: 10.0000 mg | Freq: Once | INTRAMUSCULAR | Status: DC | PRN

## 2011-11-06 MED ORDER — LIDOCAINE HCL 1 % IJ SOLN
INTRAMUSCULAR | Status: DC | PRN
  Administered 2011-11-06: 10 mL

## 2011-11-06 SURGICAL SUPPLY — 59 items
BANDAGE GAUZE 4  KLING STR (GAUZE/BANDAGES/DRESSINGS) IMPLANT
BANDAGE GAUZE ELAST BULKY 4 IN (GAUZE/BANDAGES/DRESSINGS) ×1 IMPLANT
BLADE AVERAGE 25X9 (BLADE) ×1 IMPLANT
BLADE MINI RND TIP GREEN BEAV (BLADE) IMPLANT
BNDG CMPR 9X4 STRL LF SNTH (GAUZE/BANDAGES/DRESSINGS) ×1
BNDG COHESIVE 1X5 TAN STRL LF (GAUZE/BANDAGES/DRESSINGS) IMPLANT
BNDG COHESIVE 4X5 TAN STRL (GAUZE/BANDAGES/DRESSINGS) ×1 IMPLANT
BNDG COHESIVE 6X5 TAN STRL LF (GAUZE/BANDAGES/DRESSINGS) IMPLANT
BNDG ESMARK 4X9 LF (GAUZE/BANDAGES/DRESSINGS) ×2 IMPLANT
BNDG GAUZE STRTCH 6 (GAUZE/BANDAGES/DRESSINGS) IMPLANT
CANISTER SUCTION 2500CC (MISCELLANEOUS) ×1 IMPLANT
CLOTH BEACON ORANGE TIMEOUT ST (SAFETY) ×2 IMPLANT
CORDS BIPOLAR (ELECTRODE) IMPLANT
COVER SURGICAL LIGHT HANDLE (MISCELLANEOUS) ×2 IMPLANT
CUFF TOURNIQUET SINGLE 18IN (TOURNIQUET CUFF) IMPLANT
CUFF TOURNIQUET SINGLE 24IN (TOURNIQUET CUFF) IMPLANT
CUFF TOURNIQUET SINGLE 34IN LL (TOURNIQUET CUFF) IMPLANT
CUFF TOURNIQUET SINGLE 44IN (TOURNIQUET CUFF) IMPLANT
DRAPE U-SHAPE 47X51 STRL (DRAPES) ×2 IMPLANT
DRSG ADAPTIC 3X8 NADH LF (GAUZE/BANDAGES/DRESSINGS) IMPLANT
DRSG EMULSION OIL 3X3 NADH (GAUZE/BANDAGES/DRESSINGS) ×2 IMPLANT
DRSG PAD ABDOMINAL 8X10 ST (GAUZE/BANDAGES/DRESSINGS) ×2 IMPLANT
DURAPREP 26ML APPLICATOR (WOUND CARE) ×2 IMPLANT
ELECT REM PT RETURN 9FT ADLT (ELECTROSURGICAL) ×2
ELECTRODE REM PT RTRN 9FT ADLT (ELECTROSURGICAL) ×1 IMPLANT
GAUZE SPONGE 2X2 8PLY STRL LF (GAUZE/BANDAGES/DRESSINGS) IMPLANT
GLOVE BIOGEL PI IND STRL 6.5 (GLOVE) IMPLANT
GLOVE BIOGEL PI IND STRL 9 (GLOVE) ×1 IMPLANT
GLOVE BIOGEL PI INDICATOR 6.5 (GLOVE) ×1
GLOVE BIOGEL PI INDICATOR 9 (GLOVE) ×1
GLOVE SS BIOGEL STRL SZ 6.5 (GLOVE) ×1 IMPLANT
GLOVE SUPERSENSE BIOGEL SZ 6.5 (GLOVE) ×1
GLOVE SURG ORTHO 9.0 STRL STRW (GLOVE) ×2 IMPLANT
GOWN PREVENTION PLUS XLARGE (GOWN DISPOSABLE) ×2 IMPLANT
GOWN SRG XL XLNG 56XLVL 4 (GOWN DISPOSABLE) ×1 IMPLANT
GOWN STRL NON-REIN XL XLG LVL4 (GOWN DISPOSABLE) ×2
KIT BASIN OR (CUSTOM PROCEDURE TRAY) ×2 IMPLANT
KIT ROOM TURNOVER OR (KITS) ×2 IMPLANT
MANIFOLD NEPTUNE II (INSTRUMENTS) IMPLANT
NDL HYPO 25GX1X1/2 BEV (NEEDLE) IMPLANT
NEEDLE 22X1 1/2 (OR ONLY) (NEEDLE) ×1 IMPLANT
NEEDLE HYPO 25GX1X1/2 BEV (NEEDLE) IMPLANT
NS IRRIG 1000ML POUR BTL (IV SOLUTION) ×2 IMPLANT
PACK ORTHO EXTREMITY (CUSTOM PROCEDURE TRAY) ×2 IMPLANT
PAD ARMBOARD 7.5X6 YLW CONV (MISCELLANEOUS) ×4 IMPLANT
PAD CAST 4YDX4 CTTN HI CHSV (CAST SUPPLIES) IMPLANT
PADDING CAST COTTON 4X4 STRL (CAST SUPPLIES)
SPECIMEN JAR SMALL (MISCELLANEOUS) ×1 IMPLANT
SPONGE GAUZE 2X2 STER 10/PKG (GAUZE/BANDAGES/DRESSINGS)
SPONGE GAUZE 4X4 12PLY (GAUZE/BANDAGES/DRESSINGS) ×1 IMPLANT
SPONGE LAP 18X18 X RAY DECT (DISPOSABLE) ×1 IMPLANT
SUCTION FRAZIER TIP 10 FR DISP (SUCTIONS) IMPLANT
SUT ETHILON 2 0 PSLX (SUTURE) ×2 IMPLANT
SUT VIC AB 2-0 FS1 27 (SUTURE) ×1 IMPLANT
SYR CONTROL 10ML LL (SYRINGE) ×1 IMPLANT
TOWEL OR 17X24 6PK STRL BLUE (TOWEL DISPOSABLE) ×2 IMPLANT
TOWEL OR 17X26 10 PK STRL BLUE (TOWEL DISPOSABLE) ×2 IMPLANT
TUBE CONNECTING 12X1/4 (SUCTIONS) ×2 IMPLANT
WATER STERILE IRR 1000ML POUR (IV SOLUTION) ×1 IMPLANT

## 2011-11-06 NOTE — Op Note (Signed)
OPERATIVE REPORT  DATE OF SURGERY: 11/06/2011  PATIENT:  Theresa Schneider,  56 y.o. female  PRE-OPERATIVE DIAGNOSIS:  Right Great Toe Hallux Rigidus  POST-OPERATIVE DIAGNOSIS:  Right Great Toe Hallux Rigidus  PROCEDURE:  Procedure(s): Cheilectomy right great toe  SURGEON:  Surgeon(s): Nadara Mustard, MD  ANESTHESIA:   local and general  EBL:  Minimal ML  SPECIMEN:  No Specimen  TOURNIQUET:  * Missing tourniquet times found for documented tourniquets in log:  [id number] *  PROCEDURE DETAILS: Patient is a 56 year old woman with hallux rigidus of right great toe she has pain with activities of daily living has failed conservative treatment and presents at this time for surgical intervention. Risks and benefits were discussed including infection neurovascular injury persistent pain recurrent stiffness need for additional surgery. Patient states she understands and wished to proceed at this time. Description of procedure patient was brought to the OR and underwent a general anesthetic. After adequate levels and anesthesia obtained patient's right lower extremity was prepped using DuraPrep and draped in the sterile field. An Esmarch was used the ankle for tourniquet control. A medial longitudinal incision was made over the MTP joint. This was carried down a football shaped the retinaculum was ellipsed from the plantar aspect. There is synovitis within the joint and a synovectomy was performed. Ostectomy followed by a cheilectomy was performed patient's dorsiflexion went from 10 of dorsiflexion 70. The wound was irrigated with normal saline the retinaculum was closed 0 Vicryl the skin was closed using 2-0 nylon wound is covered with Adaptic orthopedic sponges AB dressing Kerlix and Coban. Patient was extubated taken the PACU in stable condition. Local infiltration of 10 cc 1% lidocaine plain. Esmarch time of the ankle approximately 15 minutes.  PLAN OF CARE: Discharge to home after PACU  PATIENT  DISPOSITION:  PACU - hemodynamically stable.   Nadara Mustard, MD 11/06/2011 9:11 AM

## 2011-11-06 NOTE — Anesthesia Preprocedure Evaluation (Signed)
Anesthesia Evaluation  Patient identified by MRN, date of birth, ID band Patient awake    Reviewed: Allergy & Precautions, H&P , NPO status , Patient's Chart, lab work & pertinent test results, reviewed documented beta blocker date and time   Airway Mallampati: II TM Distance: >3 FB Neck ROM: full    Dental   Pulmonary asthma ,          Cardiovascular negative cardio ROS      Neuro/Psych PSYCHIATRIC DISORDERS Anxiety  Neuromuscular disease    GI/Hepatic negative GI ROS, Neg liver ROS,   Endo/Other  Hypothyroidism Hyperthyroidism   Renal/GU negative Renal ROS  negative genitourinary   Musculoskeletal   Abdominal   Peds  Hematology negative hematology ROS (+)   Anesthesia Other Findings See surgeon's H&P   Reproductive/Obstetrics negative OB ROS                           Anesthesia Physical Anesthesia Plan  ASA: II  Anesthesia Plan: General   Post-op Pain Management:    Induction: Intravenous  Airway Management Planned: LMA  Additional Equipment:   Intra-op Plan:   Post-operative Plan: Extubation in OR  Informed Consent: I have reviewed the patients History and Physical, chart, labs and discussed the procedure including the risks, benefits and alternatives for the proposed anesthesia with the patient or authorized representative who has indicated his/her understanding and acceptance.   Dental Advisory Given  Plan Discussed with: CRNA and Surgeon  Anesthesia Plan Comments:         Anesthesia Quick Evaluation

## 2011-11-06 NOTE — Anesthesia Postprocedure Evaluation (Signed)
Anesthesia Post Note  Patient: Theresa Schneider  Procedure(s) Performed: Procedure(s) (LRB): AMPUTATION DIGIT (Right)  Anesthesia type: General  Patient location: PACU  Post pain: Pain level controlled  Post assessment: Patient's Cardiovascular Status Stable  Last Vitals:  Filed Vitals:   11/06/11 0915  BP:   Pulse:   Temp: 36.1 C  Resp:     Post vital signs: Reviewed and stable  Level of consciousness: alert  Complications: No apparent anesthesia complications

## 2011-11-06 NOTE — Anesthesia Procedure Notes (Signed)
Procedure Name: LMA Insertion Date/Time: 11/06/2011 8:34 AM Performed by: Luster Landsberg Pre-anesthesia Checklist: Patient identified, Emergency Drugs available, Suction available and Patient being monitored Patient Re-evaluated:Patient Re-evaluated prior to inductionOxygen Delivery Method: Circle system utilized Preoxygenation: Pre-oxygenation with 100% oxygen Ventilation: Mask ventilation without difficulty LMA: LMA inserted LMA Size: 4.0 Number of attempts: 1 Placement Confirmation: ETT inserted through vocal cords under direct vision and breath sounds checked- equal and bilateral Tube secured with: Tape Dental Injury: Teeth and Oropharynx as per pre-operative assessment

## 2011-11-06 NOTE — Progress Notes (Signed)
Orthopedic Tech Progress Note Patient Details:  Theresa Schneider 09-11-1955 [id number]  Ortho Devices Type of Ortho Device: Crutches Ortho Device/Splint Interventions: Application   Delorean Knutzen T 11/06/2011, 10:01 AM

## 2011-11-06 NOTE — Transfer of Care (Signed)
Immediate Anesthesia Transfer of Care Note  Patient: Theresa Schneider  Procedure(s) Performed: Procedure(s) (LRB): AMPUTATION DIGIT (Right)  Patient Location: PACU  Anesthesia Type: General  Level of Consciousness: awake, alert  and oriented  Airway & Oxygen Therapy: Patient Spontanous Breathing and Patient connected to nasal cannula oxygen  Post-op Assessment: Report given to PACU RN, Post -op Vital signs reviewed and stable and Patient moving all extremities  Post vital signs: Reviewed and stable  Complications: No apparent anesthesia complications

## 2011-11-06 NOTE — H&P (Signed)
Theresa Schneider is an 56 y.o. female.   Chief Complaint: Pain right great toe MTP joint. HPI: Patient is a-year-old woman who has hallux rigidus of the right great toe. She has pain with activities of daily living has failed conservative care and presents at this time for surgical intervention.  Past Medical History  Diagnosis Date  . GAD (generalized anxiety disorder)   . Allergy     SEASONAL/PERENNIAL  . Osteoporosis 2009  . Hypothyroidism     (since I-131 treatment)  . Hallux rigidus   . Hyperthyroidism      hx graves disease. dr Catha Brow       Past Surgical History  Procedure Date  . Tonsillectomy   . Rotator cuff repair   . Breast surgery     fibra anomony    Family History  Problem Relation Age of Onset  . Arthritis Mother     OSTEO   Social History:  reports that she has never smoked. She has never used smokeless tobacco. She reports that she drinks alcohol. She reports that she does not use illicit drugs.  Allergies:  Allergies  Allergen Reactions  . Codeine Phosphate Nausea And Vomiting  . Latex Itching    senitivity    No prescriptions prior to admission    No results found for this or any previous visit (from the past 48 hour(s)). No results found.  Review of Systems  All other systems reviewed and are negative.    There were no vitals taken for this visit. Physical Exam  Patient's right foot is neurovascularly intact good pulses she only has 10 range of motion of the MTP joint of the great toe with bony spurs dorsally. Assessment/Plan Assessment: Hallux rigidus right great toe MTP joint.  Plan: We'll plan for cheilectomy. Risks and benefits of surgery were discussed including infection neurovascular injury persistent pain DVT need for additional surgery. Patient states she understands and wished to proceed at this time.  Mickaela Starlin V 11/06/2011, 6:04 AM

## 2011-11-08 ENCOUNTER — Encounter (HOSPITAL_COMMUNITY): Payer: Self-pay | Admitting: Orthopedic Surgery

## 2011-11-27 ENCOUNTER — Ambulatory Visit (INDEPENDENT_AMBULATORY_CARE_PROVIDER_SITE_OTHER): Payer: 59 | Admitting: Medical

## 2011-11-27 ENCOUNTER — Encounter: Payer: Self-pay | Admitting: Medical

## 2011-11-27 VITALS — BP 116/76 | HR 60 | Temp 96.4°F | Resp 16 | Wt 127.0 lb

## 2011-11-27 DIAGNOSIS — T148XXA Other injury of unspecified body region, initial encounter: Secondary | ICD-10-CM

## 2011-11-27 DIAGNOSIS — W57XXXA Bitten or stung by nonvenomous insect and other nonvenomous arthropods, initial encounter: Secondary | ICD-10-CM

## 2011-11-27 MED ORDER — AMOXICILLIN-POT CLAVULANATE 875-125 MG PO TABS: 1.0000 | ORAL_TABLET | Freq: Two times a day (BID) | ORAL | Status: AC

## 2011-11-27 NOTE — Patient Instructions (Addendum)
Begin Benadryl OTC 1/2 - 1 tablet two to three times daily for bite and itching.  Begin OTC steroid cream such as hydrocortisone topically to the bite 2-3 times daily.  If worse signs of infection such as worsening redness, hardness, hot or warm to touch, red streaking, fever, nausea, then begin the Augmentin.

## 2011-11-27 NOTE — Progress Notes (Signed)
Subjective: Here for c/o insect bite.  Was sitting on her deck in the afternoon 2 days ago and felt a bite to her left knee.  Didn't see the actual insect, but since then has gotten a raised red somewhat blistered area.  Never had similar insect bite in the past.  The area is not painful or hard or warm, but is itchy.  Wanted this checked out as she is going out of town tomorrow for class reunion.  Used some Witch Hazel topically.  No other aggravating or relieving factors.   Objective: Gen: wd, wn, nad Skin: left lateral leg over tender with 1cm round erythematous base with raised lesion that is somewhat hard and appears bullous.  No induration, fluctuance, streaking, warmth.  Only slightly tender.    Assessment: Encounter Diagnosis  Name Primary?  . Insect bite Yes   Plan: Advised she keep area clean with soap and water, use Hydrocortisone topically 2-3 times daily, begin 1/2 -1 Benadryl OTC 2-3 times daily.  If worse signs of infection as we discussed, then begin Augmentin.  Script given.  If signs of eschar, ulceration, tissue breakdown, get rechecked right away.

## 2012-02-06 ENCOUNTER — Encounter: Payer: Self-pay | Admitting: Internal Medicine

## 2012-02-18 ENCOUNTER — Telehealth: Payer: Self-pay | Admitting: Internal Medicine

## 2012-02-18 ENCOUNTER — Other Ambulatory Visit: Payer: Self-pay | Admitting: Obstetrics and Gynecology

## 2012-02-18 DIAGNOSIS — Z1231 Encounter for screening mammogram for malignant neoplasm of breast: Secondary | ICD-10-CM

## 2012-02-18 NOTE — Telephone Encounter (Signed)
Patient received recall letter for colonoscopy. She had foot surgery in the summer and was put on antiinflammatory medications. She has stopped the medications but is still having GI upset. Does not feel good when she eats and when lying down. Scheduled patient with Mike Gip, PA on 02/20/12 at 9:00 AM.

## 2012-02-20 ENCOUNTER — Ambulatory Visit (INDEPENDENT_AMBULATORY_CARE_PROVIDER_SITE_OTHER): Payer: 59 | Admitting: Physician Assistant

## 2012-02-20 ENCOUNTER — Encounter: Payer: Self-pay | Admitting: Physician Assistant

## 2012-02-20 VITALS — BP 120/70 | HR 60 | Ht 62.75 in | Wt 130.8 lb

## 2012-02-20 DIAGNOSIS — R1013 Epigastric pain: Secondary | ICD-10-CM

## 2012-02-20 DIAGNOSIS — Z800 Family history of malignant neoplasm of digestive organs: Secondary | ICD-10-CM

## 2012-02-20 DIAGNOSIS — Z1211 Encounter for screening for malignant neoplasm of colon: Secondary | ICD-10-CM

## 2012-02-20 MED ORDER — ESOMEPRAZOLE MAGNESIUM 40 MG PO CPDR: 40.0000 mg | DELAYED_RELEASE_CAPSULE | Freq: Every day | ORAL | Status: DC

## 2012-02-20 MED ORDER — MOVIPREP 100 G PO SOLR: 1.0000 | Freq: Once | ORAL | Status: DC

## 2012-02-20 NOTE — Progress Notes (Signed)
Subjective:    Patient ID: Theresa Schneider, female    DOB: Aug 12, 1955, 56 y.o.   MRN: [id number]  HPI Theresa Schneider is a very nice 56 year old white female known to Dr. Lina Schneider from prior colonoscopy. She had screening colonoscopy done in 2006 which was a normal exam area she does have family history of colon cancer in her paternal grandfather, and her father has had multiple polyps. She comes in today to discuss followup colonoscopy . She has occasional problems with constipation but other than that has no lower GI complaints. She is no melena or hematochezia and no complaints of change of bowel habits. She had foot surgery in July of 2013 and after that had been taking to 4 Advil every day over at least to 6 week period and developed epigastric pain. The Aleve but continued to have the epigastric discomfort. She was then given a prescription anti-inflammatory by her orthopedist and that also bothered her stomach so she stopped it about 3 weeks ago she describes a pressure type of urination high in her epigastrium. She says she seems to feel best before she eats lunch and better if she eats smaller amounts. She is also had some heartburn symptoms with this with radiation of the pressure into her chest intermittently. He has not had any nausea or vomiting has no complaints of dysphagia or odynophagia her appetite is fine and her weight has been stable At this time she feels like she can function without any anti-inflammatories, she has not tried any over-the-counter acid blockers other than Tums.    Review of Systems  Constitutional: Negative.   HENT: Negative.   Eyes: Negative.   Respiratory: Negative.   Cardiovascular: Negative.   Gastrointestinal: Positive for abdominal pain.  Genitourinary: Negative.   Musculoskeletal: Negative.   Neurological: Negative.   Hematological: Negative.   Psychiatric/Behavioral: Negative.    Outpatient Prescriptions Prior to Visit  Medication Sig Dispense Refill  .  albuterol (PROVENTIL HFA;VENTOLIN HFA) 108 (90 BASE) MCG/ACT inhaler Inhale 2 puffs into the lungs every 6 (six) hours as needed. For shortness of breath      . calcium-vitamin D (OSCAL WITH D) 500-200 MG-UNIT per tablet Take 1 tablet by mouth 2 (two) times daily.        . cholecalciferol (VITAMIN D) 1000 UNITS tablet Take 1,000 Units by mouth 2 (two) times daily.        Marland Kitchen levothyroxine (SYNTHROID) 112 MCG tablet Take 112 mcg by mouth daily.      . Multiple Vitamins-Minerals (MULTIVITAMIN WITH MINERALS) tablet Take 1 tablet by mouth 2 (two) times daily.        Marland Kitchen PRESCRIPTION MEDICATION Inject 1 application into the muscle every 7 (seven) days. Allergy shots weekly      . b complex vitamins tablet Take 2 tablets by mouth daily.             Allergies  Allergen Reactions  . Codeine Phosphate Nausea And Vomiting  . Latex Itching    senitivity   Patient Active Problem List  Diagnosis  . TIBIALIS TENDINITIS  . OTHER ENTHESOPATHY OF ANKLE AND TARSUS  . Unspecified hypothyroidism  . Osteoporosis  . Anxiety state, unspecified   History   Social History  . Marital Status: Married    Spouse Name: N/A    Number of Children: 3  . Years of Education: N/A   Occupational History  . RADIOLOGY [employer]   Social History Main Topics  . Smoking status: Never Smoker   .  Smokeless tobacco: Never Used  . Alcohol Use: Yes     occ  . Drug Use: No  . Sexually Active: Not on file   Other Topics Concern  . Not on file   Social History Narrative   Teaches radiology.  [employer] employee    Objective:   Physical Exam well-developed white female in no acute distress, pleasant blood pressure 120/70 pulse 60 height 5 foot 2 weight 1:30. HEENT; nontraumatic normocephalic EOMI PERRLA sclera anicteric, Neck; supple no JVD, Cardiovascular; regular rate and rhythm with S1-S2 no murmur or gallop, Pulmonary; clear bilaterally, Abdomen ;soft basically nontender nondistended bowel sounds are active there  is no palpable mass or hepatosplenomegaly, Rectal exam not done, Extremities ;no clubbing cyanosis or edema, Psych; mood and affect normal and appropriate        Assessment & Plan:  #33 56 year old female due for followup colonoscopy for screening with positive family history of colon cancer in a grandparent. Colonoscopy 7 years ago was normal #2   2-3 month history of epigastric discomfort-area likely secondary to an NSAID-induced gastritis versus peptic ulcer disease. She is off of NSAIDs at this time  Plan; schedule for colonoscopy with Dr. Lina Schneider, procedure was discussed in detail with the patient and she is agreeable to proceed. Have advised her to remain off of NSAIDs and use Tylenol as needed for foot pain Start trial of Nexium 40 mg by mouth every morning x1 month. If her symptoms persist despite treatment with Nexium then would consider further workup to include EGD.

## 2012-02-20 NOTE — Progress Notes (Signed)
I have reviewed the note and agree with colonoscopy. Trial of PPI , hold NSAID's.

## 2012-02-20 NOTE — Patient Instructions (Addendum)
You have been scheduled for a colonoscopy with propofol. Please follow written instructions given to you at your visit today.  Please pick up your prep kit at the pharmacy within the next 1-3 days. If you use inhalers (even only as needed), please bring them with you on the day of your procedure.  We have given you samples of the following medication to take: Nexium. Please take one by mouth thirty minutes before breakfast every day for one month   We have sent the following medications to your pharmacy for you to pick up at your convenience: Moviprep

## 2012-02-21 NOTE — Progress Notes (Signed)
Reviewed and agree with management. Saleha Kalp D. Maeci Kalbfleisch, M.D., FACG  

## 2012-03-25 ENCOUNTER — Ambulatory Visit
Admission: RE | Admit: 2012-03-25 | Discharge: 2012-03-25 | Disposition: A | Discharge status: Home / Self Care | Payer: 59 | Source: Ambulatory Visit | Attending: Obstetrics and Gynecology | Admitting: Obstetrics and Gynecology

## 2012-03-25 ENCOUNTER — Ambulatory Visit: Payer: 59

## 2012-03-25 DIAGNOSIS — Z1231 Encounter for screening mammogram for malignant neoplasm of breast: Secondary | ICD-10-CM

## 2012-04-14 ENCOUNTER — Encounter: Payer: Self-pay | Admitting: Internal Medicine

## 2012-04-14 ENCOUNTER — Ambulatory Visit (AMBULATORY_SURGERY_CENTER): Payer: 59 | Admitting: Internal Medicine

## 2012-04-14 VITALS — BP 105/76 | HR 62 | Temp 97.8°F | Resp 24 | Ht 62.0 in | Wt 130.0 lb

## 2012-04-14 DIAGNOSIS — Z1211 Encounter for screening for malignant neoplasm of colon: Secondary | ICD-10-CM

## 2012-04-14 MED ORDER — SODIUM CHLORIDE 0.9 % IV SOLN: 500.0000 mL | INTRAVENOUS | Status: DC

## 2012-04-14 NOTE — Op Note (Signed)
[facility] [address] [place], [address]   COLONOSCOPY PROCEDURE REPORT  PATIENT: Theresa, Schneider  MR#: [id number] BIRTHDATE: 04/19/1956 , 56  yrs. old GENDER: Female ENDOSCOPIST: Hart Carwin, MD REFERRED BY:  Joselyn Arrow, M.D.  Meredeth Ide, M.D. PROCEDURE DATE:  04/14/2012 PROCEDURE:   Colonoscopy, screening ASA CLASS:   Class I INDICATIONS:Average risk patient for colon cancer and last colonoscopy 2006, paternal grandfather with colon cancer. MEDICATIONS: MAC sedation, administered by CRNA and propofol (Diprivan) 300mg  IV  DESCRIPTION OF PROCEDURE:   After the risks and benefits and of the procedure were explained, informed consent was obtained.  A digital rectal exam revealed no abnormalities of the rectum.    The LB [id number]  endoscope was introduced through the anus and advanced to the cecum, which was identified by both the appendix and ileocecal valve .  The quality of the prep was good, using MoviPrep .  The instrument was then slowly withdrawn as the colon was fully examined.     COLON FINDINGS: A normal appearing cecum, ileocecal valve, and appendiceal orifice were identified.  The ascending, hepatic flexure, transverse, splenic flexure, descending, sigmoid colon and rectum appeared unremarkable.  No polyps or cancers were seen. Retroflexed views revealed no abnormalities.     The scope was then withdrawn from the patient and the procedure completed.  COMPLICATIONS: There were no complications. ENDOSCOPIC IMPRESSION: Normal colon  RECOMMENDATIONS: High fiber diet   REPEAT EXAM: In 10 year(s)  for Colonoscopy.  cc:  _______________________________ eSignedHart Carwin, MD 04/14/2012 3:16 PM

## 2012-04-14 NOTE — Patient Instructions (Addendum)
YOU HAD AN ENDOSCOPIC PROCEDURE TODAY AT [facility]: Refer to the procedure report that was given to you for any specific questions about what was found during the examination.  If the procedure report does not answer your questions, please call your gastroenterologist to clarify.  If you requested that your care partner not be given the details of your procedure findings, then the procedure report has been included in a sealed envelope for you to review at your convenience later.  YOU SHOULD EXPECT: Some feelings of bloating in the abdomen. Passage of more gas than usual.  Walking can help get rid of the air that was put into your GI tract during the procedure and reduce the bloating. If you had a lower endoscopy (such as a colonoscopy or flexible sigmoidoscopy) you may notice spotting of blood in your stool or on the toilet paper. If you underwent a bowel prep for your procedure, then you may not have a normal bowel movement for a few days.  DIET: Your first meal following the procedure should be a light meal and then it is ok to progress to your normal diet.  A half-sandwich or bowl of soup is an example of a good first meal.  Heavy or fried foods are harder to digest and may make you feel nauseous or bloated.  Likewise meals heavy in dairy and vegetables can cause extra gas to form and this can also increase the bloating.  Drink plenty of fluids but you should avoid alcoholic beverages for 24 hours.  ACTIVITY: Your care partner should take you home directly after the procedure.  You should plan to take it easy, moving slowly for the rest of the day.  You can resume normal activity the day after the procedure however you should NOT DRIVE or use heavy machinery for 24 hours (because of the sedation medicines used during the test).    SYMPTOMS TO REPORT IMMEDIATELY: A gastroenterologist can be reached at any hour.  During normal business hours, 8:30 AM to 5:00 PM Monday through Friday,  call [phone].  After hours and on weekends, please call the GI answering service at [phone] who will take a message and have the physician on call contact you.   Following lower endoscopy (colonoscopy or flexible sigmoidoscopy):  Excessive amounts of blood in the stool  Significant tenderness or worsening of abdominal pains  Swelling of the abdomen that is new, acute  Fever of 100F or higher     FOLLOW UP: If any biopsies were taken you will be contacted by phone or by letter within the next 1-3 weeks.  Call your gastroenterologist if you have not heard about the biopsies in 3 weeks.  Our staff will call the home number listed on your records the next business day following your procedure to check on you and address any questions or concerns that you may have at that time regarding the information given to you following your procedure. This is a courtesy call and so if there is no answer at the home number and we have not heard from you through the emergency physician on call, we will assume that you have returned to your regular daily activities without incident.  SIGNATURES/CONFIDENTIALITY: You and/or your care partner have signed paperwork which will be entered into your electronic medical record.  These signatures attest to the fact that that the information above on your After Visit Summary has been reviewed and is understood.  Full responsibility of the  confidentiality of this discharge information lies with you and/or your care-partner.    Normal colonoscopy.   Recommend high fiber diet.

## 2012-04-14 NOTE — Progress Notes (Signed)
Patient did not experience any of the following events: a burn prior to discharge; a fall within the facility; wrong site/side/patient/procedure/implant event; or a hospital transfer or hospital admission upon discharge from the facility. (G8907) Patient did not have preoperative order for IV antibiotic SSI prophylaxis. (G8918)  

## 2012-04-14 NOTE — Progress Notes (Signed)
Pt. Has received Levsin .25mg  x 1 in recovery.  Has passed air. Abdomen soft. Continues to complain with abdominal cramps. Advised to try warm liquids, walk, and attempt knee chest position at home if cramps continue.

## 2012-04-15 ENCOUNTER — Telehealth: Payer: Self-pay

## 2012-04-15 NOTE — Telephone Encounter (Signed)
  Follow up Call-  Call back number 04/14/2012  Post procedure Call Back phone  # [phone] cell  Permission to leave phone message Yes     Patient questions:  Do you have a fever, pain , or abdominal swelling? no Pain Score  0 *  Have you tolerated food without any problems? yes  Have you been able to return to your normal activities? yes  Do you have any questions about your discharge instructions: Diet   no Medications  no Follow up visit  no  Do you have questions or concerns about your Care? no  Actions: * If pain score is 4 or above: No action needed, pain <4.

## 2012-04-28 ENCOUNTER — Encounter: Payer: Self-pay | Admitting: Internal Medicine

## 2012-07-07 ENCOUNTER — Encounter: Payer: Self-pay | Admitting: Obstetrics and Gynecology

## 2012-08-05 ENCOUNTER — Ambulatory Visit: Payer: Self-pay | Admitting: Obstetrics and Gynecology

## 2012-09-11 ENCOUNTER — Ambulatory Visit (INDEPENDENT_AMBULATORY_CARE_PROVIDER_SITE_OTHER): Payer: BC Managed Care – PPO | Admitting: Obstetrics and Gynecology

## 2012-09-11 ENCOUNTER — Encounter: Payer: Self-pay | Admitting: Obstetrics and Gynecology

## 2012-09-11 ENCOUNTER — Ambulatory Visit: Payer: Self-pay | Admitting: Obstetrics and Gynecology

## 2012-09-11 VITALS — BP 120/80 | Ht 62.5 in | Wt 125.0 lb

## 2012-09-11 DIAGNOSIS — Z733 Stress, not elsewhere classified: Secondary | ICD-10-CM

## 2012-09-11 DIAGNOSIS — N952 Postmenopausal atrophic vaginitis: Secondary | ICD-10-CM

## 2012-09-11 DIAGNOSIS — F439 Reaction to severe stress, unspecified: Secondary | ICD-10-CM

## 2012-09-11 DIAGNOSIS — Z01419 Encounter for gynecological examination (general) (routine) without abnormal findings: Secondary | ICD-10-CM

## 2012-09-11 DIAGNOSIS — Z0000 Encounter for general adult medical examination without abnormal findings: Secondary | ICD-10-CM

## 2012-09-11 DIAGNOSIS — N393 Stress incontinence (female) (male): Secondary | ICD-10-CM

## 2012-09-11 DIAGNOSIS — M810 Age-related osteoporosis without current pathological fracture: Secondary | ICD-10-CM

## 2012-09-11 LAB — POCT URINALYSIS DIPSTICK
Blood, UA: NEGATIVE
Nitrite, UA: NEGATIVE
Urobilinogen, UA: NEGATIVE
pH, UA: 5

## 2012-09-11 MED ORDER — ESTRADIOL 10 MCG VA TABS: 1.0000 | ORAL_TABLET | VAGINAL | Status: DC

## 2012-09-11 NOTE — Progress Notes (Signed)
Patient ID: Theresa Schneider, female   DOB: [birth year], 57 y.o.   MRN: [id number] 57 y.o.  Married  Caucasian female   [phone] here for annual exam.   Some vaginal dryness.  Not really using Vagifem tablets.  Forgets. Cystocele.  Voids often to avoid leakage.  Leaks if coughs and sneezes.  Saw Dr. Patsi Sears in the past.  Had urodynamic  testing.  Does not wear pads but empties often.   Due for Prolia injection.  Has changed insurance.  Has had three injections so far.  Due for bone density in September 2014.   Takes calcium and vitamin D.  Eats dairy products - 2 servings per day.   Stress in personal life.  Retired early from [employer], unexpected.  Now working for [employer].  Taking care of elderly parents.  Grandchild with health issues.  Feels like she is in the middle of all the problems.  Patient's last menstrual period was 05/06/2004.          Sexually active: no The current method of family planning is post menopausal status.    Exercising: occasional walking Last mammogram:  03/2012 [facility] Last pap smear: 07/2010 wnl: no HPV History of abnormal pap: Yes 30 years ago--no treatment-reverted to normal Smoking:no Alcohol: occasionally Last colonoscopy: 04/2012 wnl:Dr. Verlee Monte Brodie:next one due 10 years Last Bone Density:  01/2011 [facility]:osteoporosis Last tetanus shot: 2012 Last cholesterol check: unsure  Hgb: 13.5               Urine: Neg    Health Maintenance  Topic Date Due  . Pap Smear  10/27/1971  . Tetanus/tdap  10/26/1972  . Influenza Vaccine  01/04/2013  . Mammogram  03/25/2014  . Colonoscopy  04/14/2022    Family History  Problem Relation Age of Onset  . Arthritis Mother     OSTEO  . Heart disease Father   . Heart attack Father 75  . Colon polyps Father     beign  . Colon cancer Paternal Grandfather 30  . Cancer Maternal Grandfather     esophageal  . Esophageal cancer Maternal Grandfather 76  . Breast cancer Paternal Aunt   . Colon  polyps Paternal Aunt   . COPD Paternal Aunt   . Rectal cancer Neg Hx   . Stomach cancer Neg Hx     Patient Active Problem List   Diagnosis Date Noted  . Unspecified hypothyroidism 02/20/2011  . Osteoporosis 02/20/2011  . Anxiety state, unspecified 02/20/2011  . TIBIALIS TENDINITIS 07/14/2007  . OTHER ENTHESOPATHY OF ANKLE AND TARSUS 07/14/2007    Past Medical History  Diagnosis Date  . GAD (generalized anxiety disorder)   . Allergy     SEASONAL/PERENNIAL  . Osteoporosis 2009  . Hallux rigidus   . Hypothyroidism     (since I-131 treatment)  . Hyperthyroidism      hx graves disease. dr Lindie Spruce pcp     . Anxiety   . Asthma     allergy related  . Depression   . Tendinitis of right ankle 2007    Past Surgical History  Procedure Laterality Date  . Tonsillectomy    . Rotator cuff repair      right  . Hallux rigidus  11/06/2011  . Breast surgery      fibroadenoma, right    Allergies: Codeine phosphate and Latex  Current Outpatient Prescriptions  Medication Sig Dispense Refill  . albuterol (PROVENTIL HFA;VENTOLIN HFA) 108 (90 BASE) MCG/ACT inhaler Inhale  2 puffs into the lungs every 6 (six) hours as needed. For shortness of breath      . ASTEPRO 0.15 % SOLN       . b complex vitamins tablet Take 1 tablet by mouth once a week.      Marland Kitchen BEPREVE 1.5 % SOLN       . calcium-vitamin D (OSCAL WITH D) 500-200 MG-UNIT per tablet Take 1 tablet by mouth 2 (two) times daily.        . cholecalciferol (VITAMIN D) 1000 UNITS tablet Take 1,000 Units by mouth 2 (two) times daily.        Marland Kitchen denosumab (PROLIA) 60 MG/ML SOLN injection Inject 60 mg into the skin every 6 (six) months. Administer in upper arm, thigh, or abdomen      . levocetirizine (XYZAL) 5 MG tablet       . levothyroxine (SYNTHROID) 112 MCG tablet Take 112 mcg by mouth daily.      . montelukast (SINGULAIR) 10 MG tablet       . Multiple Vitamins-Minerals (MULTIVITAMIN WITH MINERALS) tablet Take 1 tablet by mouth 2 (two) times  daily.        Marland Kitchen PRESCRIPTION MEDICATION Inject 1 application into the muscle every 7 (seven) days. Allergy shots weekly      . QVAR 80 MCG/ACT inhaler       . esomeprazole (NEXIUM) 40 MG capsule Take 1 capsule (40 mg total) by mouth daily.  30 capsule  0   No current facility-administered medications for this visit.    ROS: Pertinent items are noted in HPI.  Social Hx:  Married.   Works for [employer]  Exam:    BP 120/80  Ht 5' 2.5" (1.588 m)  Wt 125 lb (56.7 kg)  BMI 22.48 kg/m2  LMP 05/06/2004   Wt Readings from Last 3 Encounters:  09/11/12 125 lb (56.7 kg)  04/14/12 130 lb (58.968 kg)  02/20/12 130 lb 12.8 oz (59.33 kg)     Ht Readings from Last 3 Encounters:  09/11/12 5' 2.5" (1.588 m)  04/14/12 5\' 2"  (1.575 m)  02/20/12 5' 2.75" (1.594 m)    General appearance: alert, cooperative and appears stated age Head: Normocephalic, without obvious abnormality, atraumatic Neck: no adenopathy, supple, symmetrical, trachea midline and thyroid not enlarged, symmetric, no tenderness/mass/nodules Lungs: clear to auscultation bilaterally Breasts: Inspection negative, No nipple retraction or dimpling, No nipple discharge or bleeding, No axillary or supraclavicular adenopathy, Normal to palpation without dominant masses Heart: regular rate and rhythm Abdomen: soft, non-tender; bowel sounds normal; no masses,  no organomegaly Extremities: extremities normal, atraumatic, no cyanosis or edema Skin: Skin color, texture, turgor normal. No rashes or lesions Lymph nodes: Cervical, supraclavicular, and axillary nodes normal. No abnormal inguinal nodes palpated Neurologic: Grossly normal   Pelvic: External genitalia:  no lesions              Urethra:  normal appearing urethra with no masses, tenderness or lesions              Bartholins and Skenes: normal                 Vagina: normal appearing vagina with normal color and discharge, no lesions.  Less than first degree  cystocele.  Fair Kegel.                Cervix: normal appearance              Pap taken: yes and  high risk HPV.        Bimanual Exam:  Uterus:  uterus is normal size, shape, consistency and nontender                                      Adnexa: normal adnexa in size, nontender and no masses                                      Rectovaginal: Confirms                                      Anus:  normal sphincter tone, no lesions  A: normal gyn exam Genuine stress incontinence Osteoporosis Atrophic vaginitis Situational stress     P: mammogram yearly pap smear and high risk HPV testing I discussed with patient kegel exercises and potential midurethral sling.  She will consider.    Bone Density in September 2014 Re-order Prolia for one year.  Will need to get prior authorization from new insurance company. Vagifem rx. Gave patient name and phone number of therapist, Vonna Kotyk to do cognitive therapy. return annually or prn     An After Visit Summary was printed and given to the patient.

## 2012-09-11 NOTE — Patient Instructions (Signed)

## 2012-09-12 LAB — COMPREHENSIVE METABOLIC PANEL
ALT: 14 U/L (ref 0–35)
AST: 19 U/L (ref 0–37)
Albumin: 4.2 g/dL (ref 3.5–5.2)
Calcium: 9.9 mg/dL (ref 8.4–10.5)
Chloride: 101 mEq/L (ref 96–112)
Potassium: 4.6 mEq/L (ref 3.5–5.3)
Sodium: 139 mEq/L (ref 135–145)
Total Protein: 6.9 g/dL (ref 6.0–8.3)

## 2012-09-12 LAB — LIPID PANEL
LDL Cholesterol: 100 mg/dL — ABNORMAL HIGH (ref 0–99)
VLDL: 42 mg/dL — ABNORMAL HIGH (ref 0–40)

## 2012-09-15 ENCOUNTER — Ambulatory Visit: Payer: Self-pay | Admitting: Obstetrics and Gynecology

## 2012-10-09 ENCOUNTER — Telehealth: Payer: Self-pay | Admitting: *Deleted

## 2012-10-09 NOTE — Telephone Encounter (Signed)
LEFT MESSAGE ON [facility] CELL # TO CALL CONCERNING [employer] INSURANCE VERIFICATION REQUEST FORM FAXED ON MAY 13TH. REFAXED FORM AGAIN TO FAX # ON FORM. SUE

## 2012-10-09 NOTE — Telephone Encounter (Signed)
INFORMATION [facility] WAS REQEUSTING GIVEN TO REPRESENTATIVE AT [facility]. STATED WOULD FAX FORM OF DECISION.

## 2012-10-09 NOTE — Telephone Encounter (Signed)
CALLED [facility] AND SPOKE TO REPRESENTATIVE. WAS TOLD FORM HAD BEEN RECEIVED . WAS NEEDING MORE INFORMATION CONCERNING PHARMACY BENEFIT AND PHARMACY. LEFT MESSAGE ON PATIENT CB# OF THIS AND TO RETURN OUR CALL TO OUR OFFICE.WITH THIS INFORMATION. SUE

## 2012-10-12 ENCOUNTER — Telehealth: Payer: Self-pay | Admitting: *Deleted

## 2012-10-12 NOTE — Telephone Encounter (Signed)
Theresa Schneider, Please make this patient an appoinment to see dr Edward Jolly to discuss options. It may benefit her to check her insurance preferred medication list before she comes in so she will have an idea of options for Dr Edward Jolly to select from.

## 2012-10-12 NOTE — Telephone Encounter (Signed)
Patient notified that pharmacy benefit manager advised that Prolia is not covered through her pharmacy benefit of [employer]. Explained that could be covered under her Medical Benefit @ hospital through [facility]. Patient  Unsure of what to do due to out of pocket expense to her. Explained to patient that would call [facility] to see if it was under Medical Benefit and let her know. Patient agrees to this.

## 2012-10-13 NOTE — Telephone Encounter (Signed)
LMOVM to call to discuss Prolia.  Her new insurance will not cover.  Needs appt. To discuss options with Dr. Edward Jolly.  Pt. Needs To call her insurance to find what preferred meds are.

## 2012-10-15 ENCOUNTER — Telehealth: Payer: Self-pay | Admitting: Obstetrics and Gynecology

## 2012-10-15 NOTE — Telephone Encounter (Signed)
Spoke with patient and advised new insurance will not cover Prolia.  Advised patient to contact her insurance company and find out What medications are on her formulary for osteoporosis therapy and come in to discuss alternate therapy with Dr. Edward Jolly.  Made Appointment with Dr. Edward Jolly for 10-30-12 7:30a.m.

## 2012-10-15 NOTE — Telephone Encounter (Signed)
Please see completed documentation on encounter of 10-12-12.

## 2012-10-15 NOTE — Telephone Encounter (Signed)
Patient states she is returning Amanda's call re: Prolia. Marchelle Folks will call patient back.

## 2012-10-30 ENCOUNTER — Ambulatory Visit (INDEPENDENT_AMBULATORY_CARE_PROVIDER_SITE_OTHER): Payer: BC Managed Care – PPO | Admitting: Obstetrics and Gynecology

## 2012-10-30 ENCOUNTER — Encounter: Payer: Self-pay | Admitting: Obstetrics and Gynecology

## 2012-10-30 VITALS — BP 120/82 | HR 76 | Resp 16 | Ht 62.5 in | Wt 125.0 lb

## 2012-10-30 DIAGNOSIS — M810 Age-related osteoporosis without current pathological fracture: Secondary | ICD-10-CM

## 2012-10-30 NOTE — Patient Instructions (Signed)
Osteoporosis Throughout your life, your body breaks down old bone and replaces it with new bone. As you get older, your body does not replace bone as quickly as it breaks it down. By the age of 30 years, most people begin to gradually lose bone because of the imbalance between bone loss and replacement. Some people lose more bone than others. Bone loss beyond a specified normal degree is considered osteoporosis.  Osteoporosis affects the strength and durability of your bones. The inside of the ends of your bones and your flat bones, like the bones of your pelvis, look like honeycomb, filled with tiny open spaces. As bone loss occurs, your bones become less dense. This means that the open spaces inside your bones become bigger and the walls between these spaces become thinner. This makes your bones weaker. Bones of a person with osteoporosis can become so weak that they can break (fracture) during minor accidents, such as a simple fall. CAUSES  The following factors have been associated with the development of osteoporosis:  Smoking.  Drinking more than 2 alcoholic drinks several days per week.  Long-term use of certain medicines:  Corticosteroids.  Chemotherapy medicines.  Thyroid medicines.  Antiepileptic medicines.  Gonadal hormone suppression medicine.  Immunosuppression medicine.  Being underweight.  Lack of physical activity.  Lack of exposure to the sun. This can lead to vitamin D deficiency.  Certain medical conditions:  Certain inflammatory bowel diseases, such as Crohn's disease and ulcerative colitis.  Diabetes.  Hyperthyroidism.  Hyperparathyroidism. RISK FACTORS Anyone can develop osteoporosis. However, the following factors can increase your risk of developing osteoporosis:  Gender Women are at higher risk than men.  Age Being older than 50 years increases your risk.  Ethnicity White and Asian people have an increased risk.  Weight Being extremely  underweight can increase your risk of osteoporosis.  Family history of osteoporosis Having a family member who has developed osteoporosis can increase your risk. SYMPTOMS  Usually, people with osteoporosis have no symptoms.  DIAGNOSIS  Signs during a physical exam that may prompt your caregiver to suspect osteoporosis include:  Decreased height. This is usually caused by the compression of the bones that form your spine (vertebrae) because they have weakened and become fractured.  A curving or rounding of the upper back (kyphosis). To confirm signs of osteoporosis, your caregiver may request a procedure that uses 2 low-dose X-ray beams with different levels of energy to measure your bone mineral density (dual-energy X-ray absorptiometry [DXA]). Also, your caregiver may check your level of vitamin D. TREATMENT  The goal of osteoporosis treatment is to strengthen bones in order to decrease the risk of bone fractures. There are different types of medicines available to help achieve this goal. Some of these medicines work by slowing the processes of bone loss. Some medicines work by increasing bone density. Treatment also involves making sure that your levels of calcium and vitamin D are adequate. PREVENTION  There are things you can do to help prevent osteoporosis. Adequate intake of calcium and vitamin D can help you achieve optimal bone mineral density. Regular exercise can also help, especially resistance and weight-bearing activities. If you smoke, quitting smoking is an important part of osteoporosis prevention. MAKE SURE YOU:  Understand these instructions.  Will watch your condition.  Will get help right away if you are not doing well or get worse. Document Released: 01/30/2005 Document Revised: 04/08/2012 Document Reviewed: 04/06/2011 Exit[employer] Patient Information 2014 [employer].  

## 2012-10-30 NOTE — Progress Notes (Signed)
Patient ID: Theresa Schneider, female   DOB: 08/18/1955, 57 y.o.   MRN: [id number]  Subjective  Patient here to discuss osteoporosis and treatment options. Insurance will not pay for Prolia. Has done some research about Forteo and believes it will not be paid for.  Patient has tried oral bisphosphonate Actonel and felt bad with GI symptoms but did not see improvement in the bone density. Also has difficulty going back to bed after taking medication.  Patient has a history of presumed gastritis after taking NSAIDs for left foot pain following surgery.   Sees Dr. Lucianne Muss endocrinology for hypothyroidism.    Takes Calcium supplements and vit D 1000 IU bid  Objetive  BMD 01/16/11 - Spine - T score was -2.8, Left hip T score was - 2.3 BMD 01/08/08 - Spine - T score was - 2.9, Left hip T score was - 2.4 BMD 10/07/02 - Spine T score was - 2.4,  Left hip T score was -1.6  Assessment  Osteoporosis  Plan  Check Vit D level today. Discussed moderation of ETOH use, no tobacco, weight bearing exercise. Patient will contact her insurance co to see what is on her preferred list for osteoporosis treatment. I discussed new bisphosphonate with less GI side effects. I also discussed Evista. If bone density in September shows worsening osteoporosis, will consult with Dr. Lucianne Muss.

## 2013-01-19 ENCOUNTER — Other Ambulatory Visit: Payer: Self-pay | Admitting: *Deleted

## 2013-01-19 DIAGNOSIS — E039 Hypothyroidism, unspecified: Secondary | ICD-10-CM

## 2013-01-27 ENCOUNTER — Other Ambulatory Visit (INDEPENDENT_AMBULATORY_CARE_PROVIDER_SITE_OTHER): Payer: BC Managed Care – PPO

## 2013-01-27 DIAGNOSIS — E039 Hypothyroidism, unspecified: Secondary | ICD-10-CM

## 2013-01-27 LAB — TSH: TSH: 1.93 u[IU]/mL (ref 0.35–5.50)

## 2013-01-29 ENCOUNTER — Ambulatory Visit: Payer: Self-pay | Admitting: Endocrinology

## 2013-02-03 ENCOUNTER — Ambulatory Visit (INDEPENDENT_AMBULATORY_CARE_PROVIDER_SITE_OTHER): Payer: BC Managed Care – PPO | Admitting: Family Medicine

## 2013-02-03 ENCOUNTER — Encounter: Payer: Self-pay | Admitting: Endocrinology

## 2013-02-03 ENCOUNTER — Encounter: Payer: Self-pay | Admitting: Family Medicine

## 2013-02-03 VITALS — BP 122/78 | HR 60 | Ht 62.0 in | Wt 123.0 lb

## 2013-02-03 DIAGNOSIS — F411 Generalized anxiety disorder: Secondary | ICD-10-CM

## 2013-02-03 DIAGNOSIS — Z8249 Family history of ischemic heart disease and other diseases of the circulatory system: Secondary | ICD-10-CM | POA: Insufficient documentation

## 2013-02-03 DIAGNOSIS — R079 Chest pain, unspecified: Secondary | ICD-10-CM

## 2013-02-03 DIAGNOSIS — E781 Pure hyperglyceridemia: Secondary | ICD-10-CM

## 2013-02-03 NOTE — Progress Notes (Signed)
Chief Complaint  Patient presents with  . stomach pain    possible stress related, cramps with tingling pain through-out of chest  and stomach, pt will get flu shot at work   Today, while driving to work she started feeling nauseated, some cramping in her stomach (upper stomach, somewhat on the right, then spread to epigastrium).  Then also started having some pain in her right lower molar.  Pain wasn't excruciating, described as "radiating pain".  Started in epigastrium, came up into the chest some, and then radiated out to both sides.  No radiation to arm, neck.  No shortness of breath with it, not diaphoretic.  No belching. No pleuritic chest pain.  Felt very different from any chest tightness related to asthma.  Symptoms self-resolved after about 15 minutes (while still driving to work). 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  She felt like maybe she needed inhaler, but didn't want to feel jittery so didn't use med.  She sometimes feels winded when talking a lot/teaching (ongoing); denies feeling short of breath today.  She has been feeling very stressed for about 6 months, and when she feels anxious (which has been more often) she feels a pressure in her chest. Started while driving to hospital (to see her dad), not with any exertion.  She walks occasionally, occasionally runs--no problems with chest pain with exercise.  Denies tachycardia, palpitations.  Her father had MI in May, with h/o MI at age 32.  He was also hospitalized in December (pancreatitis, needed cholecystectomy).  He had 2 stents placed in May, and had pacemaker put in recently. He is home now, in [place], and thinks he is now doing better.   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  She has been very stressed about making decisions re: her retirement. 59 year old mother-in-law's health is declining;  periodically stays with her, lives alone.  Dealing essentially with 3 aging parents.  Granddaughter with multiple food allergies (59 yo) causing severe eczema.  She has had stress tests twice in the past (once in her 43's with Dr. Myrtis Ser; another one about 10 years ago, prior to fibroadenoma removal--had echo and stress test)  Husband had diarrhea recently--he has sensitive stomach (not sure if a virus, of something else)  Chart reviewed--TG were >200 at her last check in May 2014 done by her GYN.  It was recommended that she follow a lowfat diet, and f/u here for recheck.  She reports that she was NOT fasting prior to those labs, had at least coffee with cream prior to the blood draw.  Past Medical History  Diagnosis Date  . GAD (generalized anxiety disorder)   . Allergy     SEASONAL/PERENNIAL  . Osteoporosis 2009  . Hallux rigidus   . Hypothyroidism     (since I-131 treatment)  . Hyperthyroidism      hx graves disease. dr Lindie Spruce pcp     . Anxiety   . Asthma     allergy related  . Depression   . Tendinitis of right ankle 2007  . Gastritis 2013    from NSAID's after foot surgery   Past Surgical History  Procedure Laterality Date  . Tonsillectomy    . Rotator cuff repair      right  . Hallux rigidus  11/06/2011  . Breast surgery      fibroadenoma, right   History  Social History  . Marital Status: Married    Spouse Name: N/A    Number of Children: 3  . Years of Education: N/A   Occupational History  . RADIOLOGY [place]   Social History Main Topics  . Smoking status: Never Smoker   . Smokeless tobacco: Never Used  . Alcohol Use: Yes     Comment: occ  . Drug Use: No  . Sexual Activity: Not Currently    Birth Control/ Protection: Post-menopausal   Other Topics Concern  . 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   Family History  Problem Relation Age of Onset  .  Arthritis Mother     OSTEO  . Hypothyroidism Mother   . Heart disease Father 94    first MI at 50; stents at 35; pacemaker at 73  . Heart attack Father 76  . Colon polyps Father     beign  . Colon cancer Paternal Grandfather 54  . Cancer Maternal Grandfather     esophageal  . Esophageal cancer Maternal Grandfather 36  . Breast cancer Paternal Aunt   . Colon polyps Paternal Aunt   . COPD Paternal Aunt   . Rectal cancer Neg Hx   . Stomach cancer Neg Hx   . Diabetes Brother     obese; pre-diabetic  . Hypothyroidism Sister   . Hypothyroidism Sister    Current outpatient prescriptions:albuterol (PROVENTIL HFA;VENTOLIN HFA) 108 (90 BASE) MCG/ACT inhaler, Inhale 2 puffs into the lungs every 6 (six) hours as needed. For shortness of breath, Disp: , Rfl: ;  ASTEPRO 0.15 % SOLN, , Disp: , Rfl: ;  b complex vitamins tablet, Take 1 tablet by mouth once a week., Disp: , Rfl: ;  BEPREVE 1.5 % SOLN, , Disp: , Rfl:  calcium-vitamin D (OSCAL WITH D) 500-200 MG-UNIT per tablet, Take 1 tablet by mouth 2 (two) times daily.  , Disp: , Rfl: ;  Estradiol 10 MCG TABS, Place 1 tablet (10 mcg total) vaginally 2 (two) times a week., Disp: 8 tablet, Rfl: 11;  levocetirizine (XYZAL) 5 MG tablet, , Disp: , Rfl: ;  levothyroxine (SYNTHROID) 112 MCG tablet, Take 112 mcg by mouth daily., Disp: , Rfl: ;  montelukast (SINGULAIR) 10 MG tablet, , Disp: , Rfl:  Multiple Vitamins-Minerals (MULTIVITAMIN WITH MINERALS) tablet, Take 1 tablet by mouth 2 (two) times daily.  , Disp: , Rfl: ;  PRESCRIPTION MEDICATION, Inject 1 application into the muscle every 7 (seven) days. Allergy shots weekly, Disp: , Rfl: ;  QVAR 80 MCG/ACT inhaler, , Disp: , Rfl: ;  cholecalciferol (VITAMIN D) 1000 UNITS tablet, Take 1,000 Units by mouth 2 (two) times daily.  , Disp: , Rfl:  denosumab (PROLIA) 60 MG/ML SOLN injection, Inject 60 mg into the skin every 6 (six) months. Administer in upper arm, thigh, or abdomen, Disp: , Rfl: ;  esomeprazole (NEXIUM)  40 MG capsule, Take 1 capsule (40 mg total) by mouth daily., Disp: 30 capsule, Rfl: 0 She is NOT taking Nexium or Prolia currently, and she stopped the Vitamin D supplement (as directed per GYN)  Allergies  Allergen Reactions  . Codeine Phosphate Nausea And Vomiting  . Latex Itching    senitivity   ROS:  Denies fevers, chills, URI symptoms.  Nausea resolved.  Last BM was yesterday, normal. No fevers.  Allergies not flaring now (on preventative meds).  Denies reflux symptoms.  She was at the beach this past weekend with girlfriends, where she drank ice tea, vodka and lemonade--had some GI symptoms for which she took Tums and it resolved. No bleeding/bruising, rash.  +stress, denies depression. See HPI.  No headaches, dizziness  PHYSICAL EXAM: BP 122/78  Pulse 60  Ht 5\' 2"  (1.575 m)  Wt 123 lb (55.792 kg)  BMI 22.49 kg/m2  LMP 05/06/2004  Very talkative, pleasant female in no distress.  Speaking easily in long sentences HEENT:  PERRL, EOMI, conjunctiva clear.  OP clear Neck: no lymphadenopathy, thyromegaly or mass Heart: regular rate and rhythm without murmur Lungs: clear bilaterally.  Good air movement. No wheezes, rales, ronchi Abdomen: soft, nontender, no organomegaly or mass Chest:  Chest wall is nontender Extremities: no edema Skin: no bruising or rashes noted Psych: normal mood, affect, hygiene and grooming. Normal eye contact, nonpressured speech Neuro: alert and oriented.  Cranial nerves intact.  Normal gait, strength  EKG: NSR.  Possible LAE.  Very slight PR depression, not significant.  No other ST-T abnormalities.  Compared to prior EKG from 2013--unchanged.  ASSESSMENT/PLAN:  Chest pain - Plan: EKG 12-Lead  Family history of premature CAD - Plan: Lipid panel, High sensitivity CRP  Hypertriglyceridemia - Plan: Lipid panel  Anxiety state, unspecified  Return for fasting lipids and hs-CRP.   Elevated TG at GYN in May likely not a concern--TG was just over 200, but  nonfasting.  Chest pain--DDx reviewed in detail (cardiac, respiratory/pulmonary, GI/reflux, MSK, stress). EKG is normal, prior negative w/u. Will keep track of symptoms, and if ongoing pain, may need referral back to cardiology.  She will f/u if ongoing symptoms of chest pain for further eval.  Will keep journal of symptoms, when they occur, if at rest, after eating, exertional, what makes symptoms resolve.  If epigastric pain, radiating into chest after eating, recommended trial of antacid to see if symptoms improve.  Declines flu shot today--pl[facility] to [facility]  All questions answered.  Visit >25 minutes, more than 1/2 spent counseling

## 2013-02-03 NOTE — Patient Instructions (Addendum)
Your EKG was normal, unchanged from previous EKG from last year. Schedule fasting labs. Keep track of recurrent symptoms--when they occurred, related to food, stress, exertion.  How long did it last, type of pain, what made it go away, etc. If having ongoing symptoms, follow-up here to discuss, and then we can decide if it sounds cardiac (and refer you back to cardiologist) vs GI vs other. Work on stress reduction techniques.

## 2013-02-03 NOTE — Progress Notes (Deleted)
Patient ID: Theresa Schneider, female   DOB: 06-Oct-1955, 57 y.o.   MRN: [id number]  Reason for Appointment:  Hypothyroidism, followup visit    History of Present Illness:   The hypothyroidism was first diagnosed in ***  Complaints are reported by the patient now are ***         The symptoms consistent with hypothyroidism are: fatigue,  can't seem to lose weight , cold sensitivity, difficulty concentrating , dry skin, hair loss.           The treatments that the patient has taken include Synthroid.           The response to therapy has been ***          Compliance with the medical regimen has been as prescribed with taking the tablet in the morning before breakfast.  No visits with results within 1 Week(s) from this visit. Latest known visit with results is:  Appointment on 01/27/2013  Component Date Value Range Status  . Free T4 01/27/2013 1.09  0.60 - 1.60 ng/dL Final  . TSH 65/78/4696 1.93  0.35 - 5.50 uIU/mL Final      Medication List       This list is accurate as of: 02/03/13  8:09 AM.  Always use your most recent med list.               albuterol 108 (90 BASE) MCG/ACT inhaler  Commonly known as:  PROVENTIL HFA;VENTOLIN HFA  Inhale 2 puffs into the lungs every 6 (six) hours as needed. For shortness of breath     ASTEPRO 0.15 % Soln  Generic drug:  Azelastine HCl     b complex vitamins tablet  Take 1 tablet by mouth once a week.     BEPREVE 1.5 % Soln  Generic drug:  Bepotastine Besilate     calcium-vitamin D 500-200 MG-UNIT per tablet  Commonly known as:  OSCAL WITH D  Take 1 tablet by mouth 2 (two) times daily.     cholecalciferol 1000 UNITS tablet  Commonly known as:  VITAMIN D  Take 1,000 Units by mouth 2 (two) times daily.     denosumab 60 MG/ML Soln injection  Commonly known as:  PROLIA  Inject 60 mg into the skin every 6 (six) months. Administer in upper arm, thigh, or abdomen     esomeprazole 40 MG capsule  Commonly known as:  NEXIUM  Take 1 capsule  (40 mg total) by mouth daily.     Estradiol 10 MCG Tabs vaginal tablet  Place 1 tablet (10 mcg total) vaginally 2 (two) times a week.     levocetirizine 5 MG tablet  Commonly known as:  XYZAL     montelukast 10 MG tablet  Commonly known as:  SINGULAIR     multivitamin with minerals tablet  Take 1 tablet by mouth 2 (two) times daily.     PRESCRIPTION MEDICATION  Inject 1 application into the muscle every 7 (seven) days. Allergy shots weekly     QVAR 80 MCG/ACT inhaler  Generic drug:  beclomethasone     SYNTHROID 112 MCG tablet  Generic drug:  levothyroxine  Take 112 mcg by mouth daily.        Past Medical History  Diagnosis Date  . GAD (generalized anxiety disorder)   . Allergy     SEASONAL/PERENNIAL  . Osteoporosis 2009  . Hallux rigidus   . Hypothyroidism     (since I-131 treatment)  . Hyperthyroidism  hx graves disease. dr Lindie Spruce pcp     . Anxiety   . Asthma     allergy related  . Depression   . Tendinitis of right ankle 2007    Past Surgical History  Procedure Laterality Date  . Tonsillectomy    . Rotator cuff repair      right  . Hallux rigidus  11/06/2011  . Breast surgery      fibroadenoma, right    Family History  Problem Relation Age of Onset  . Arthritis Mother     OSTEO  . Heart disease Father   . Heart attack Father 68  . Colon polyps Father     beign  . Colon cancer Paternal Grandfather 25  . Cancer Maternal Grandfather     esophageal  . Esophageal cancer Maternal Grandfather 73  . Breast cancer Paternal Aunt   . Colon polyps Paternal Aunt   . COPD Paternal Aunt   . Rectal cancer Neg Hx   . Stomach cancer Neg Hx     Social History:  reports that she has never smoked. She has never used smokeless tobacco. She reports that  drinks alcohol. She reports that she does not use illicit drugs.  Allergies:  Allergies  Allergen Reactions  . Codeine Phosphate Nausea And Vomiting  . Latex Itching    senitivity     Examination:    LMP 05/06/2004   GENERAL APPEARANCE: Alert And looks well.    FACE: No puffiness of face or periorbital edema.         NECK: no thyromegaly.          NEUROLOGIC EXAM: DTRs 2+ bilaterally at biceps.    Assessments   Hypothyroidism    Treatment:   Continue same dosage before breakfast daily. Avoid taking any calcium or iron supplements with the thyroid supplement.    Theresa Schneider 02/03/2013, 8:09 AM

## 2013-02-04 ENCOUNTER — Encounter: Payer: Self-pay | Admitting: Endocrinology

## 2013-02-04 ENCOUNTER — Ambulatory Visit (INDEPENDENT_AMBULATORY_CARE_PROVIDER_SITE_OTHER): Payer: BC Managed Care – PPO | Admitting: Endocrinology

## 2013-02-04 VITALS — BP 130/84 | HR 68 | Temp 98.4°F | Resp 12 | Ht 62.75 in | Wt 123.0 lb

## 2013-02-04 DIAGNOSIS — E890 Postprocedural hypothyroidism: Secondary | ICD-10-CM

## 2013-02-04 NOTE — Patient Instructions (Signed)
No change 

## 2013-02-04 NOTE — Progress Notes (Signed)
Patient ID: Theresa Schneider, female   DOB: 11-17-1955, 57 y.o.   MRN: [id number]  Reason for Appointment:  Hypothyroidism, followup visit    History of Present Illness:   The hypothyroidism was first diagnosed after treatment for Graves' disease several years ago She has generally done well with Synthroid supplementation and has been taking about the same dose for the last 2-3 years. Does state brand-name medication Complaints are reported by the patient now are mild fatigue which does not seem unusual. No significant weight change or cold sensitivity She has been followed annually              Compliance with the medical regimen has been as prescribed with taking the tablet in the morning before breakfast.  No visits with results within 1 Week(s) from this visit. Latest known visit with results is:  Appointment on 01/27/2013  Component Date Value Range Status  . Free T4 01/27/2013 1.09  0.60 - 1.60 ng/dL Final  . TSH 78/29/5621 1.93  0.35 - 5.50 uIU/mL Final      Medication List       This list is accurate as of: 02/04/13  8:06 AM.  Always use your most recent med list.               albuterol 108 (90 BASE) MCG/ACT inhaler  Commonly known as:  PROVENTIL HFA;VENTOLIN HFA  Inhale 2 puffs into the lungs every 6 (six) hours as needed. For shortness of breath     ASTEPRO 0.15 % Soln  Generic drug:  Azelastine HCl     b complex vitamins tablet  Take 1 tablet by mouth once a week.     BEPREVE 1.5 % Soln  Generic drug:  Bepotastine Besilate     calcium-vitamin D 500-200 MG-UNIT per tablet  Commonly known as:  OSCAL WITH D  Take 1 tablet by mouth 2 (two) times daily.     cholecalciferol 1000 UNITS tablet  Commonly known as:  VITAMIN D  Take 1,000 Units by mouth 2 (two) times daily.     denosumab 60 MG/ML Soln injection  Commonly known as:  PROLIA  Inject 60 mg into the skin every 6 (six) months. Administer in upper arm, thigh, or abdomen     esomeprazole 40 MG capsule   Commonly known as:  NEXIUM  Take 1 capsule (40 mg total) by mouth daily.     Estradiol 10 MCG Tabs vaginal tablet  Place 1 tablet (10 mcg total) vaginally 2 (two) times a week.     levocetirizine 5 MG tablet  Commonly known as:  XYZAL     montelukast 10 MG tablet  Commonly known as:  SINGULAIR     multivitamin with minerals tablet  Take 1 tablet by mouth 2 (two) times daily.     PRESCRIPTION MEDICATION  Inject 1 application into the muscle every 7 (seven) days. Allergy shots weekly     QVAR 80 MCG/ACT inhaler  Generic drug:  beclomethasone     SYNTHROID 112 MCG tablet  Generic drug:  levothyroxine  Take 112 mcg by mouth daily.        Past Medical History  Diagnosis Date  . GAD (generalized anxiety disorder)   . Allergy     SEASONAL/PERENNIAL  . Osteoporosis 2009  . Hallux rigidus   . Hypothyroidism     (since I-131 treatment)  . Hyperthyroidism      hx graves disease. dr Lindie Spruce pcp     . Anxiety   .  Asthma     allergy related  . Depression   . Tendinitis of right ankle 2007  . Gastritis 2013    from NSAID's after foot surgery    Past Surgical History  Procedure Laterality Date  . Tonsillectomy    . Rotator cuff repair      right  . Hallux rigidus  11/06/2011  . Breast surgery      fibroadenoma, right    Family History  Problem Relation Age of Onset  . Arthritis Mother     OSTEO  . Hypothyroidism Mother   . Heart disease Father 53    first MI at 49; stents at 21; pacemaker at 80  . Heart attack Father 23  . Colon polyps Father     beign  . Colon cancer Paternal Grandfather 53  . Cancer Maternal Grandfather     esophageal  . Esophageal cancer Maternal Grandfather 45  . Breast cancer Paternal Aunt   . Colon polyps Paternal Aunt   . COPD Paternal Aunt   . Rectal cancer Neg Hx   . Stomach cancer Neg Hx   . Diabetes Brother     obese; pre-diabetic  . Hypothyroidism Sister   . Hypothyroidism Sister     Social History:  reports that she has  never smoked. She has never used smokeless tobacco. She reports that  drinks alcohol. She reports that she does not use illicit drugs.  Allergies:  Allergies  Allergen Reactions  . Codeine Phosphate Nausea And Vomiting  . Latex Itching    senitivity     Examination:   LMP 05/06/2004   GENERAL APPEARANCE: Alert And looks well.  : No puffiness of face or periorbital edema.         NECK: no thyromegaly.          NEUROLOGIC EXAM: DTRs 2+ bilaterally at biceps.    Assessments   Hypothyroidism, post ablative with adequate replacement on current regimen of 112 mcg She is fairly compliant with this medication and will continue the same    Treatment:   Continue same dosage before breakfast daily. Avoid taking any calcium or iron supplements with the thyroid supplement.    Damion Kant 02/04/2013, 8:06 AM

## 2013-02-05 ENCOUNTER — Other Ambulatory Visit: Payer: BC Managed Care – PPO

## 2013-02-05 DIAGNOSIS — Z8249 Family history of ischemic heart disease and other diseases of the circulatory system: Secondary | ICD-10-CM

## 2013-02-05 DIAGNOSIS — E781 Pure hyperglyceridemia: Secondary | ICD-10-CM

## 2013-02-05 LAB — LIPID PANEL
HDL: 68 mg/dL (ref >39)
LDL Cholesterol: 101 mg/dL — ABNORMAL HIGH (ref 0–99)
VLDL: 19 mg/dL (ref 0–40)

## 2013-02-05 LAB — HIGH SENSITIVITY CRP: CRP, High Sensitivity: 0.6 mg/L

## 2013-02-10 ENCOUNTER — Telehealth: Payer: Self-pay | Admitting: *Deleted

## 2013-02-10 NOTE — Telephone Encounter (Signed)
Patient informed. 

## 2013-02-10 NOTE — Telephone Encounter (Signed)
I have no idea what happened--she is not active in my chart for me to release.  I didn't send letter, and it is no longer in my inbox. Please advise that her lipids and CRP were normal.  Cholesterol is excellent, hs-CRP is "low risk".

## 2013-02-10 NOTE — Telephone Encounter (Signed)
Patient called in looking for her lab results. I looked and was confused as to what was released/resulted. Can you please look at these?

## 2013-02-22 ENCOUNTER — Other Ambulatory Visit: Payer: Self-pay | Admitting: Endocrinology

## 2013-04-19 ENCOUNTER — Telehealth: Payer: Self-pay | Admitting: *Deleted

## 2013-04-19 NOTE — Telephone Encounter (Signed)
Fax received regarding Prolia.  It is not covered and patient is not taking medication.  See scanned report.Signed by dr Edward Jolly.  This is for documentation only.

## 2013-09-10 ENCOUNTER — Telehealth: Payer: Self-pay | Admitting: Obstetrics and Gynecology

## 2013-09-10 ENCOUNTER — Other Ambulatory Visit: Payer: Self-pay

## 2013-09-10 DIAGNOSIS — Z1231 Encounter for screening mammogram for malignant neoplasm of breast: Secondary | ICD-10-CM

## 2013-09-10 NOTE — Telephone Encounter (Signed)
Left message to call Kaitlyn at [phone]. 

## 2013-09-10 NOTE — Telephone Encounter (Signed)
Patient calling about problems with discomfort using new RX vaginal for insertion (forgets name). She states Dr. Quincy Simmonds changed the RX from what Dr. Joan Flores previously prescribed and it is "just not working, it is not enough." Please advise?    [place]

## 2013-09-13 NOTE — Telephone Encounter (Signed)
Spoke with patient. Patient state that she was previously a patient of Dr.Romine and now is a patient of Dr.Silva. Patient is using Estradiol 41mcg vaginally twice a week for vaginal dryness. Patient states that is it not providing her with enough relief. "Twice a week is not enough. Sex is uncomfortable and the dryness is terrible. I am tired of having pain." Patient states that she was previously using Estradiol every day for two weeks at a time and it was working really well. Requesting that her dose be increased again. Advised would send a message to [place] regarding dosage increase and would give patient a call back. Patient agreeable.

## 2013-09-13 NOTE — Telephone Encounter (Signed)
I have reviewed the patient's paper chart from her last visit with Dr. Joan Flores on 07/30/11, and the Vagifem was dosed as q hs for 2 weeks and then twice weekly. This particular medication is not designed to be used nightly.  Patient is due for her annual exam this month.   I will be happy to review the different options for vaginal estrogen treatment so that she can get relief of the vaginal dryness symptoms.  [name] is also a potential option.

## 2013-09-13 NOTE — Telephone Encounter (Signed)
Pulled patients paper chart. Patient was previously using Vagifem qhs x14 days then 2x per week after that. Paper chart to Dr.Silva's desk for review.

## 2013-09-14 NOTE — Telephone Encounter (Signed)
Spoke with patient. Message from [place] given as seen below. Patient states that using the Vagifem qhs for 2 weeks and then using it twice weekly was working well for her in the past. "I do not know if this would work well for me now but I would like to try it." Requesting to try using Vagifem qhs for 2 weeks and then twice weekly to see if this provides her any relief and speak with Dr.Silva at [facility] about other options if no relief. [facility] is scheduled for 5/27. Advised do not know if this will be enough time before [facility]. "I am just so uncomfortable I would like to try something before then for relief." Advised would send a message to Dr.Silva and give patient a call back with further instructions and advice.

## 2013-09-15 ENCOUNTER — Other Ambulatory Visit: Payer: Self-pay | Admitting: Obstetrics and Gynecology

## 2013-09-15 ENCOUNTER — Ambulatory Visit: Payer: Self-pay | Admitting: Obstetrics and Gynecology

## 2013-09-15 MED ORDER — ESTRADIOL 10 MCG VA TABS: ORAL_TABLET | VAGINAL | Status: DC

## 2013-09-15 NOTE — Telephone Encounter (Signed)
Ok for Vagifem 10 mcg daily for 2 weeks and then twice weekly. I will place order.   Keep appointment for annual exam.

## 2013-09-15 NOTE — Telephone Encounter (Signed)
Spoke with patient. Advised okay to begin Vagifen 60mcg daily for 2 weeks and then twice weekly. Patient agreeable and verbalizes understanding. Will try until [facility] and discuss with Dr.Silva at that time.  Routing to provider for final review. Patient agreeable to disposition. Will close encounter ;

## 2013-09-21 ENCOUNTER — Ambulatory Visit
Admission: RE | Admit: 2013-09-21 | Discharge: 2013-09-21 | Disposition: A | Discharge status: Home / Self Care | Payer: BC Managed Care – PPO | Source: Ambulatory Visit | Attending: Obstetrics and Gynecology | Admitting: Obstetrics and Gynecology

## 2013-09-21 ENCOUNTER — Ambulatory Visit
Admission: RE | Admit: 2013-09-21 | Discharge: 2013-09-21 | Disposition: A | Discharge status: Home / Self Care | Payer: BC Managed Care – PPO | Source: Ambulatory Visit

## 2013-09-21 DIAGNOSIS — M810 Age-related osteoporosis without current pathological fracture: Secondary | ICD-10-CM

## 2013-09-21 DIAGNOSIS — Z1231 Encounter for screening mammogram for malignant neoplasm of breast: Secondary | ICD-10-CM

## 2013-09-23 ENCOUNTER — Telehealth: Payer: Self-pay | Admitting: Endocrinology

## 2013-09-23 ENCOUNTER — Telehealth: Payer: Self-pay | Admitting: Emergency Medicine

## 2013-09-23 NOTE — Telephone Encounter (Signed)
Patient states she went and had a bone density; results are worse than what is was Insurance will not cover prolia injection  Dr.Silva OB/GYN referred her to speak with Dr. Dwyane Dee   Please advise this patient  Thank You :)

## 2013-09-23 NOTE — Telephone Encounter (Signed)
Spoke with patient and message from Dr. Quincy Simmonds given.  Patient states she will discuss with Dr. Dwyane Dee. Patient is wondering what Dr. Dwyane Dee can do differently than what Dr. Quincy Simmonds would suggest, she has not had prolia injections in over a year.

## 2013-09-23 NOTE — Telephone Encounter (Signed)
Message copied by Michele Mcalpine on Thu Sep 23, 2013  2:34 PM ------      Message from: [place], [place]: Thu Sep 23, 2013  4:59 AM       Please contact patient regarding her bone density study which shows worsening of her osteoporosis of the hip and spine, despite the Prolia treatment.            She will need to see her established endocrinologist, Dr. Dwyane Dee for consultation and treatment. ------

## 2013-09-23 NOTE — Telephone Encounter (Signed)
Dr. Dwyane Dee may do some blood work to see if patient has developed any new metabolic problems that may be affecting her bone density.   There are different medications that he may want to discuss with her as well.   I will defer to his expertise.

## 2013-09-24 ENCOUNTER — Telehealth: Payer: Self-pay | Admitting: Obstetrics and Gynecology

## 2013-09-24 NOTE — Telephone Encounter (Signed)
Rhonda from Dr Jodelle Green office is calling asking for a copy of patients most recent DEXA. Fax # [phone]

## 2013-09-24 NOTE — Telephone Encounter (Signed)
Please see below and advise.

## 2013-09-24 NOTE — Telephone Encounter (Signed)
Patient notified of Dr Elza Rafter response.  Patient states she has called Dr Ronnie Derby office but has not heard back yet. Advised to call us if additional assistance needed with appointment.

## 2013-09-24 NOTE — Telephone Encounter (Signed)
Need to get report of her bone density and she will need to come in to discuss options, also need to know if she has had a vitamin D level

## 2013-09-28 ENCOUNTER — Telehealth: Payer: Self-pay | Admitting: Endocrinology

## 2013-09-28 ENCOUNTER — Other Ambulatory Visit: Payer: Self-pay | Admitting: Endocrinology

## 2013-09-28 ENCOUNTER — Other Ambulatory Visit: Payer: Self-pay | Admitting: *Deleted

## 2013-09-28 DIAGNOSIS — E559 Vitamin D deficiency, unspecified: Secondary | ICD-10-CM

## 2013-09-28 DIAGNOSIS — E890 Postprocedural hypothyroidism: Secondary | ICD-10-CM

## 2013-09-28 NOTE — Telephone Encounter (Signed)
Report should be on your desk

## 2013-09-28 NOTE — Telephone Encounter (Signed)
Pt went to the [facility] and they need the orders released so they can submit them thanks!

## 2013-09-28 NOTE — Telephone Encounter (Signed)
Orders faxed

## 2013-09-28 NOTE — Telephone Encounter (Signed)
Pt would also like them to be faxed to her at [phone]

## 2013-09-29 ENCOUNTER — Ambulatory Visit: Payer: Self-pay | Admitting: Obstetrics and Gynecology

## 2013-09-29 ENCOUNTER — Encounter: Payer: Self-pay | Admitting: Endocrinology

## 2013-09-29 ENCOUNTER — Ambulatory Visit (INDEPENDENT_AMBULATORY_CARE_PROVIDER_SITE_OTHER): Payer: BC Managed Care – PPO | Admitting: Endocrinology

## 2013-09-29 ENCOUNTER — Other Ambulatory Visit: Payer: Self-pay

## 2013-09-29 VITALS — BP 142/90 | HR 78 | Temp 98.3°F | Resp 14 | Ht 62.75 in | Wt 128.0 lb

## 2013-09-29 DIAGNOSIS — M810 Age-related osteoporosis without current pathological fracture: Secondary | ICD-10-CM

## 2013-09-29 DIAGNOSIS — E890 Postprocedural hypothyroidism: Secondary | ICD-10-CM

## 2013-09-29 LAB — T4, FREE: FREE T4: 1.43 ng/dL (ref 0.80–1.80)

## 2013-09-29 LAB — TSH: TSH: 0.787 u[IU]/mL (ref 0.350–4.500)

## 2013-09-29 NOTE — Progress Notes (Signed)
Patient ID: Theresa Schneider, female   DOB: Jul 28, 1955, 58 y.o.   MRN: [id number]    Reason for Appointment: Evaluation of osteoporosis   History of Present Illness:   She has previously been evaluated and treated by her gynecologist since about 2004. Details of her previous bone density reports are not available but apparently she has had some degree of osteoporosis previously Most likely the cause of low bone density she was given a trial of Actonel about 10 years ago. She thinks that she had difficulty tolerating this because of abdominal discomfort and not clear if she took this regularly She also believes her bone density did not improve with this She was subsequently given Prolia starting in about 2012 and she got only 3 injections. The injection was stopped about 1-1/2 years ago because of lack of insurance coverage. Currently the patient has not had any treatment  She is now been referred here for management since her bone density done by her gynecologist on 09/21/13 showed a T score of -2.5 At the lumbar spine. This apparently is about 14% below the baseline study of 2004 and 6% below the previous study of 2012 Bone density at left femoral neck is -2.5 which is stable  Clinically she does not think she has had any height loss. No history of factors. No history of kidney stones or hypercalcemia She went through menopause at the usual age of about 67 and is only taking a vaginal estradiol tablet She has taken only occasional doses of prednisone or parenteral steroid injections for flareup of her allergies, usually no more than once or twice a year VITAMIN D.: She was told a few years ago to have a deficiency and was given 50,000 units weekly for about 9 months. Subsequently was placed on 1000 units twice a day of vitamin D 3 and this was stopped in 6/14 when her level was 61     Medication List       This list is accurate as of: 09/29/13 11:59 PM.  Always use your most recent med list.                albuterol 108 (90 BASE) MCG/ACT inhaler  Commonly known as:  PROVENTIL HFA;VENTOLIN HFA  Inhale 2 puffs into the lungs every 6 (six) hours as needed. For shortness of breath     ASTEPRO 0.15 % Soln  Generic drug:  Azelastine HCl     b complex vitamins tablet  Take 1 tablet by mouth once a week.     BEPREVE 1.5 % Soln  Generic drug:  Bepotastine Besilate     calcium-vitamin D 500-200 MG-UNIT per tablet  Commonly known as:  OSCAL WITH D  Take 1 tablet by mouth 2 (two) times daily.     denosumab 60 MG/ML Soln injection  Commonly known as:  PROLIA  Inject 60 mg into the skin every 6 (six) months. Administer in upper arm, thigh, or abdomen     esomeprazole 40 MG capsule  Commonly known as:  NEXIUM  Take 1 capsule (40 mg total) by mouth daily.     Estradiol 10 MCG Tabs vaginal tablet  Place one tablet (10 mcg) vaginally for 2 weeks and then place vaginally twice a week.     levocetirizine 5 MG tablet  Commonly known as:  XYZAL     montelukast 10 MG tablet  Commonly known as:  SINGULAIR     multivitamin with minerals tablet  Take 1 tablet by  mouth 2 (two) times daily.     predniSONE 10 MG tablet  Commonly known as:  DELTASONE  as needed.     PRESCRIPTION MEDICATION  Inject 1 application into the muscle every 7 (seven) days. Allergy shots weekly     QVAR 80 MCG/ACT inhaler  Generic drug:  beclomethasone     SYNTHROID 112 MCG tablet  Generic drug:  levothyroxine  TAKE 1 TABLET BY MOUTH EVERY MORNING ON AN EMPTY STOMACH        Past Medical History  Diagnosis Date  . GAD (generalized anxiety disorder)   . Allergy     SEASONAL/PERENNIAL  . Osteoporosis 2009  . Hallux rigidus   . Hypothyroidism     (since I-131 treatment)  . Hyperthyroidism      hx graves disease. dr Demetrios Isaacs pcp     . Anxiety   . Asthma     allergy related  . Depression   . Tendinitis of right ankle 2007  . Gastritis 2013    from NSAID's after foot surgery    Past  Surgical History  Procedure Laterality Date  . Tonsillectomy    . Rotator cuff repair      right  . Hallux rigidus  11/06/2011  . Breast surgery      fibroadenoma, right    Family History  Problem Relation Age of Onset  . Arthritis Mother     OSTEO  . Hypothyroidism Mother   . Heart disease Father 50    first MI at 55; stents at 78; pacemaker at 34  . Heart attack Father 4  . Colon polyps Father     beign  . Colon cancer Paternal Grandfather 74  . Cancer Maternal Grandfather     esophageal  . Esophageal cancer Maternal Grandfather 46  . Breast cancer Paternal Aunt   . Colon polyps Paternal Aunt   . COPD Paternal 64   . Rectal cancer Neg Hx   . Stomach cancer Neg Hx   . Diabetes Brother     obese; pre-diabetic  . Hypothyroidism Sister   . Hypothyroidism Sister   . Osteoporosis Maternal Aunt   . Osteoporosis Maternal Grandmother   . Osteoporosis Cousin     Social History:  reports that she has never smoked. She has never used smokeless tobacco. She reports that she drinks alcohol. She reports that she does not use illicit drugs.  Allergies:  Allergies  Allergen Reactions  . Codeine Phosphate Nausea And Vomiting  . Latex Itching    senitivity   Review of Systems  Constitutional: Negative for weight loss.  Gastrointestinal: Negative for abdominal pain.  Musculoskeletal: Negative for back pain and myalgias.    She has hypothyroidism which was first diagnosed after treatment for Graves' disease several years ago She has generally done well with Synthroid supplementation and has been taking about the same dose for the last 2-3 years.  Does take the brand-name medication Complaints are reported by the patient now are none; has no unusual fatigue significant weight change or cold sensitivity She has been followed regularly              Compliance with the medical regimen has been as prescribed with taking the tablet in the morning before breakfast.  Lab Results   Component Value Date   FREET4 1.43 09/28/2013   FREET4 1.09 01/27/2013   TSH 0.787 09/28/2013   TSH 1.93 01/27/2013   TSH 9.090* 02/20/2011     Examination:   BP  142/90  Pulse 78  Temp(Src) 98.3 F (36.8 C)  Resp 14  Ht 5' 2.75" (1.594 m)  Wt 128 lb (58.06 kg)  BMI 22.85 kg/m2  SpO2 95%  LMP 05/06/2004  Repeat height is 2 feet 2-1/8 inches     Assessments   Osteoporosis: She is asymptomatic but has a T score of -3.5 along with a family history of osteoporosis  Since she has had GI side effects from Actonel and her Prolia is not covered will consider IV Reclast  This would be effective as long as she has normal vitamin D level which needs to be reassessed Also would be convenient once a year Discussed that her only other option would be Forteo which would be more difficult to use and probably more expensive; usually this is a last resort if there is no associated fracture Discussed benefits and possible side effects of Reclast and she will be scheduled if her chemistry panel is normal today  Hypothyroidism, post ablative with adequate replacement on current regimen of 112 mcg She is fairly compliant with this medication and will continue the same   Total visit time today = 25 minutes  Cederick Broadnax 09/30/2013, 12:10 PM

## 2013-09-30 ENCOUNTER — Encounter: Payer: Self-pay | Admitting: Endocrinology

## 2013-09-30 LAB — BASIC METABOLIC PANEL
BUN: 12 mg/dL (ref 6–23)
CO2: 27 mEq/L (ref 19–32)
CREATININE: 0.61 mg/dL (ref 0.50–1.10)
Calcium: 9 mg/dL (ref 8.4–10.5)
Chloride: 104 mEq/L (ref 96–112)
Glucose, Bld: 81 mg/dL (ref 70–99)
Potassium: 3.8 mEq/L (ref 3.5–5.3)
Sodium: 140 mEq/L (ref 135–145)

## 2013-09-30 LAB — VITAMIN D 25 HYDROXY (VIT D DEFICIENCY, FRACTURES): Vit D, 25-Hydroxy: 50 ng/mL (ref 30–89)

## 2013-09-30 NOTE — Progress Notes (Signed)
Quick Note:  Please let patient know that the vitamin D result is normal and please schedule for IV Reclast infusion ______

## 2013-10-04 ENCOUNTER — Ambulatory Visit (INDEPENDENT_AMBULATORY_CARE_PROVIDER_SITE_OTHER): Payer: BC Managed Care – PPO | Admitting: Obstetrics and Gynecology

## 2013-10-04 ENCOUNTER — Encounter: Payer: Self-pay | Admitting: Obstetrics and Gynecology

## 2013-10-04 ENCOUNTER — Telehealth: Payer: Self-pay

## 2013-10-04 VITALS — BP 138/78 | HR 60 | Resp 16 | Ht 62.75 in | Wt 126.4 lb

## 2013-10-04 DIAGNOSIS — Z0000 Encounter for general adult medical examination without abnormal findings: Secondary | ICD-10-CM

## 2013-10-04 DIAGNOSIS — Z01419 Encounter for gynecological examination (general) (routine) without abnormal findings: Secondary | ICD-10-CM

## 2013-10-04 LAB — POCT URINALYSIS DIPSTICK
Bilirubin, UA: NEGATIVE
Blood, UA: NEGATIVE
Glucose, UA: NEGATIVE
Ketones, UA: NEGATIVE
Leukocytes, UA: NEGATIVE
Nitrite, UA: NEGATIVE
Protein, UA: NEGATIVE
Urobilinogen, UA: NEGATIVE
pH, UA: 5

## 2013-10-04 MED ORDER — ESTRADIOL 10 MCG VA TABS: ORAL_TABLET | VAGINAL | Status: DC

## 2013-10-04 NOTE — Patient Instructions (Addendum)
Zoledronic Acid injection (Paget's Disease, Osteoporosis) What is this medicine? ZOLEDRONIC ACID (ZOE le dron ik AS id) lowers the amount of calcium loss from bone. It is used to treat Paget's disease and osteoporosis in women. This medicine may be used for other purposes; ask your health care provider or pharmacist if you have questions. COMMON BRAND NAME(S): Reclast, Zometa What should I tell my health care provider before I take this medicine? They need to know if you have any of these conditions: -aspirin-sensitive asthma -cancer, especially if you are receiving medicines used to treat cancer -dental disease or wear dentures -infection -kidney disease -low levels of calcium in the blood -past surgery on the parathyroid gland or intestines -receiving corticosteroids like dexamethasone or prednisone -an unusual or allergic reaction to zoledronic acid, other medicines, foods, dyes, or preservatives -pregnant or trying to get pregnant -breast-feeding How should I use this medicine? This medicine is for infusion into a vein. It is given by a health care professional in a hospital or clinic setting. Talk to your pediatrician regarding the use of this medicine in children. This medicine is not approved for use in children. Overdosage: If you think you have taken too much of this medicine contact a poison control center or emergency room at once. NOTE: This medicine is only for you. Do not share this medicine with others. What if I miss a dose? It is important not to miss your dose. Call your doctor or health care professional if you are unable to keep an appointment. What may interact with this medicine? -certain antibiotics given by injection -NSAIDs, medicines for pain and inflammation, like ibuprofen or naproxen -some diuretics like bumetanide, furosemide -teriparatide This list may not describe all possible interactions. Give your health care provider a list of all the medicines,  herbs, non-prescription drugs, or dietary supplements you use. Also tell them if you smoke, drink alcohol, or use illegal drugs. Some items may interact with your medicine. What should I watch for while using this medicine? Visit your doctor or health care professional for regular checkups. It may be some time before you see the benefit from this medicine. Do not stop taking your medicine unless your doctor tells you to. Your doctor may order blood tests or other tests to see how you are doing. Women should inform their doctor if they wish to become pregnant or think they might be pregnant. There is a potential for serious side effects to an unborn child. Talk to your health care professional or pharmacist for more information. You should make sure that you get enough calcium and vitamin D while you are taking this medicine. Discuss the foods you eat and the vitamins you take with your health care professional. Some people who take this medicine have severe bone, joint, and/or muscle pain. This medicine may also increase your risk for jaw problems or a broken thigh bone. Tell your doctor right away if you have severe pain in your jaw, bones, joints, or muscles. Tell your doctor if you have any pain that does not go away or that gets worse. Tell your dentist and dental surgeon that you are taking this medicine. You should not have major dental surgery while on this medicine. See your dentist to have a dental exam and fix any dental problems before starting this medicine. Take good care of your teeth while on this medicine. Make sure you see your dentist for regular follow-up appointments. What side effects may I notice from receiving this medicine?  Side effects that you should report to your doctor or health care professional as soon as possible: -allergic reactions like skin rash, itching or hives, swelling of the face, lips, or tongue -anxiety, confusion, or depression -breathing problems -changes in  vision -eye pain -feeling faint or lightheaded, falls -jaw pain, especially after dental work -mouth sores -muscle cramps, stiffness, or weakness -trouble passing urine or change in the amount of urine Side effects that usually do not require medical attention (report to your doctor or health care professional if they continue or are bothersome): -bone, joint, or muscle pain -constipation -diarrhea -fever -hair loss -irritation at site where injected -loss of appetite -nausea, vomiting -stomach upset -trouble sleeping -trouble swallowing -weak or tired This list may not describe all possible side effects. Call your doctor for medical advice about side effects. You may report side effects to FDA at 1-800-FDA-1088. Where should I keep my medicine? This drug is given in a hospital or clinic and will not be stored at home. NOTE: This sheet is a summary. It may not cover all possible information. If you have questions about this medicine, talk to your doctor, pharmacist, or health care provider.  2014, [employer]vier/Gold Standard. (2012-10-05 10:03:48)  EXERCISE AND DIET:  We recommended that you start or continue a regular exercise program for good health. Regular exercise means any activity that makes your heart beat faster and makes you sweat.  We recommend exercising at least 30 minutes per day at least 3 days a week, preferably 4 or 5.  We also recommend a diet low in fat and sugar.  Inactivity, poor dietary choices and obesity can cause diabetes, heart attack, stroke, and kidney damage, among others.    ALCOHOL AND SMOKING:  Women should limit their alcohol intake to no more than 7 drinks/beers/glasses of wine (combined, not each!) per week. Moderation of alcohol intake to this level decreases your risk of breast cancer and liver damage. And of course, no recreational drugs are part of a healthy lifestyle.  And absolutely no smoking or even second hand smoke. Most people know smoking can  cause heart and lung diseases, but did you know it also contributes to weakening of your bones? Aging of your skin?  Yellowing of your teeth and nails?  CALCIUM AND VITAMIN D:  Adequate intake of calcium and Vitamin D are recommended.  The recommendations for exact amounts of these supplements seem to change often, but generally speaking 600 mg of calcium (either carbonate or citrate) and 800 units of Vitamin D per day seems prudent. Certain women may benefit from higher intake of Vitamin D.  If you are among these women, your doctor will have told you during your visit.    PAP SMEARS:  Pap smears, to check for cervical cancer or precancers,  have traditionally been done yearly, although recent scientific advances have shown that most women can have pap smears less often.  However, every woman still should have a physical exam from her gynecologist every year. It will include a breast check, inspection of the vulva and vagina to check for abnormal growths or skin changes, a visual exam of the cervix, and then an exam to evaluate the size and shape of the uterus and ovaries.  And after 60 years of age, a rectal exam is indicated to check for rectal cancers. We will also provide age appropriate advice regarding health maintenance, like when you should have certain vaccines, screening for sexually transmitted diseases, bone density testing, colonoscopy, mammograms,  etc.   MAMMOGRAMS:  All women over 30 years old should have a yearly mammogram. Many facilities now offer a "3D" mammogram, which may cost around $50 extra out of pocket. If possible,  we recommend you accept the option to have the 3D mammogram performed.  It both reduces the number of women who will be called back for extra views which then turn out to be normal, and it is better than the routine mammogram at detecting truly abnormal areas.    COLONOSCOPY:  Colonoscopy to screen for colon cancer is recommended for all women at age 61.  We know, you  hate the idea of the prep.  We agree, BUT, having colon cancer and not knowing it is worse!!  Colon cancer so often starts as a polyp that can be seen and removed at colonscopy, which can quite literally save your life!  And if your first colonoscopy is normal and you have no family history of colon cancer, most women don't have to have it again for 10 years.  Once every ten years, you can do something that may end up saving your life, right?  We will be happy to help you get it scheduled when you are ready.  Be sure to check your insurance coverage so you understand how much it will cost.  It may be covered as a preventative service at no cost, but you should check your particular policy.

## 2013-10-04 NOTE — Telephone Encounter (Signed)
[facility] calling regarding instructions on Estradiol tablets. Instructions need to be changed from place one tablet vaginally for two weeks and then place vaginally twice a week to place one tablet vaginally twice per week. As patient has already placed one tablet vaginally for two weeks and now needs to place one tablet twice per week this will help with insurance coverage. Advised instructions changed and rx resent.   Routing to provider for final review. Patient agreeable to disposition. Will close encounter

## 2013-10-04 NOTE — Progress Notes (Signed)
GYNECOLOGY VISIT  PCP:   Referring provider:   HPI: 58 y.o.   Married  Caucasian  female   [phone] with Patient's last menstrual period was 01/04/2005.   here for AEX   Questions about Reclast.  Seeing Dr. Dwyane Dee for endocrinology care.  Had bone loss despite use of Prolia.  Stopped Prolia.  Had a right rotator cuff tear so cannot do weight bearing exercise.  Has a dental implant done a few years ago.   Using Vagifem.    Hgb:  PCP Urine:  negative  GYNECOLOGIC HISTORY: Patient's last menstrual period was 01/04/2005. Sexually active:  yes Partner preference: female Contraception:   postmenopausal Menopausal hormone therapy: estradiol 88mcg vaginally twice weekly DES exposure: none   Blood transfusions: none   Sexually transmitted diseases:  None   GYN procedures and prior surgeries:  none Last mammogram:  09/21/13 3D-normal               Last pap and high risk HPV testing:  09/11/12-WNL/negative HR HPV  History of abnormal pap smear:  None.  Remote history of abnormal pap.  No procedures or treatment.    OB History   Grav Para Term Preterm Abortions TAB SAB Ect Mult Living   3 3 3  0 0 0 0 0 0 3       LIFESTYLE: Exercise: yes walking              Tobacco: never smoker Alcohol: occ Drug use: none   OTHER HEALTH MAINTENANCE: Tetanus/TDap: 6/08 Gardisil: none Influenza:  2015 Zostavax: none  Bone density:09/21/13-h/o osteoporosis-PCP would like her to begin reclast Colonoscopy: 04/14/12-repeat in 10 years  Cholesterol check: 02/05/13  Family History  Problem Relation Age of Onset  . Arthritis Mother     OSTEO  . Hypothyroidism Mother   . Heart disease Father 87    first MI at 44; stents at 29; pacemaker at 70  . Heart attack Father 18  . Colon polyps Father     beign  . Colon cancer Paternal Grandfather 42  . Cancer Maternal Grandfather     esophageal  . Esophageal cancer Maternal Grandfather 56  . Breast cancer Paternal Aunt   . Colon polyps Paternal Aunt    . COPD Paternal 18   . Rectal cancer Neg Hx   . Stomach cancer Neg Hx   . Diabetes Brother     obese; pre-diabetic  . Hypothyroidism Sister   . Hypothyroidism Sister   . Osteoporosis Maternal Aunt   . Osteoporosis Maternal Grandmother   . Osteoporosis Cousin     Patient Active Problem List   Diagnosis Date Noted  . Family history of premature CAD 02/03/2013  . Other postablative hypothyroidism 02/20/2011  . Osteoporosis 02/20/2011  . Anxiety state, unspecified 02/20/2011  . TIBIALIS TENDINITIS 07/14/2007  . OTHER ENTHESOPATHY OF ANKLE AND TARSUS 07/14/2007   Past Medical History  Diagnosis Date  . GAD (generalized anxiety disorder)   . Allergy     SEASONAL/PERENNIAL  . Osteoporosis 2009  . Hallux rigidus   . Hypothyroidism     (since I-131 treatment)  . Hyperthyroidism      hx graves disease. dr Demetrios Isaacs pcp     . Anxiety   . Asthma     allergy related  . Depression   . Tendinitis of right ankle 2007  . Gastritis 2013    from NSAID's after foot surgery    Past Surgical History  Procedure Laterality Date  . Tonsillectomy    .  Rotator cuff repair      right  . Hallux rigidus  11/06/2011  . Breast surgery      fibroadenoma, right    ALLERGIES: Codeine phosphate and Latex  Current Outpatient Prescriptions  Medication Sig Dispense Refill  . ASTEPRO 0.15 % SOLN       . B Complex Vitamins (VITAMIN-B COMPLEX PO) Take by mouth.      Marland Kitchen BEPREVE 1.5 % SOLN       . calcium-vitamin D (OSCAL WITH D) 500-200 MG-UNIT per tablet Take 1 tablet by mouth 2 (two) times daily.        . Estradiol 10 MCG TABS vaginal tablet Place one tablet (10 mcg) vaginally for 2 weeks and then place vaginally twice a week.  18 tablet  0  . levocetirizine (XYZAL) 5 MG tablet       . montelukast (SINGULAIR) 10 MG tablet       . Multiple Vitamins-Minerals (MULTIVITAMIN WITH MINERALS) tablet Take 1 tablet by mouth 2 (two) times daily.        Marland Kitchen PRESCRIPTION MEDICATION Inject 1 application into the  muscle every 7 (seven) days. Allergy shots weekly      . SYNTHROID 112 MCG tablet TAKE 1 TABLET BY MOUTH EVERY MORNING ON AN EMPTY STOMACH  30 tablet  10  . albuterol (PROVENTIL HFA;VENTOLIN HFA) 108 (90 BASE) MCG/ACT inhaler Inhale 2 puffs into the lungs every 6 (six) hours as needed. For shortness of breath      . denosumab (PROLIA) 60 MG/ML SOLN injection Inject 60 mg into the skin every 6 (six) months. Administer in upper arm, thigh, or abdomen      . esomeprazole (NEXIUM) 40 MG capsule Take 1 capsule (40 mg total) by mouth daily.  30 capsule  0  . levalbuterol (XOPENEX HFA) 45 MCG/ACT inhaler Inhale into the lungs every 4 (four) hours as needed for wheezing.      . predniSONE (DELTASONE) 10 MG tablet as needed.      Marland Kitchen QVAR 80 MCG/ACT inhaler        No current facility-administered medications for this visit.     ROS:  Pertinent items are noted in HPI.  SOCIAL HISTORY:    PHYSICAL EXAMINATION:    LMP 01/04/2005   Wt Readings from Last 3 Encounters:  09/29/13 128 lb (58.06 kg)  02/04/13 123 lb (55.792 kg)  02/03/13 123 lb (55.792 kg)     Ht Readings from Last 3 Encounters:  09/29/13 5' 2.75" (1.594 m)  02/04/13 5' 2.75" (1.594 m)  02/03/13 5\' 2"  (1.575 m)    General appearance: alert, cooperative and appears stated age Head: Normocephalic, without obvious abnormality, atraumatic Neck: no adenopathy, supple, symmetrical, trachea midline and thyroid not enlarged, symmetric, no tenderness/mass/nodules Lungs: clear to auscultation bilaterally Breasts: Inspection negative, No nipple retraction or dimpling, No nipple discharge or bleeding, No axillary or supraclavicular adenopathy, Normal to palpation without dominant masses Heart: regular rate and rhythm Abdomen: soft, non-tender; no masses,  no organomegaly Extremities: extremities normal, atraumatic, no cyanosis or edema Skin: Skin color, texture, turgor normal. No rashes or lesions Lymph nodes: Cervical, supraclavicular, and  axillary nodes normal. No abnormal inguinal nodes palpated Neurologic: Grossly normal  Pelvic: External genitalia:  no lesions              Urethra:  normal appearing urethra with no masses, tenderness or lesions              Bartholins and Skenes: normal  Vagina: normal appearing vagina with normal color and discharge, no lesions              Cervix: normal appearance              Pap and high risk HPV testing done: no.            Bimanual Exam:  Uterus:  uterus is normal size, shape, consistency and nontender                                      Adnexa: normal adnexa in size, nontender and no masses                                      Rectovaginal: Confirms                                      Anus:  normal sphincter tone, no lesions  ASSESSMENT  Normal gynecologic exam. Osteoporosis.  Atrophic vaginal symptoms.   PLAN  Mammogram recommended yearly.  Pap smear and high risk HPV testing not indicated.  Refill Vagifem.  See Epic orders.  Discussed risk of  DVT, PE, MI, stroke, breast cancer.  Counseled on self breast exam, Calcium and vitamin D intake, exercise. Written information to patient about Reclast. Follow up with Dr. Dwyane Dee.  Return annually or prn   An After Visit Summary was printed and given to the patient.

## 2013-10-15 ENCOUNTER — Telehealth: Payer: Self-pay | Admitting: Endocrinology

## 2013-10-15 NOTE — Telephone Encounter (Signed)
Rhonda please return call  Thank You

## 2013-10-15 NOTE — Telephone Encounter (Signed)
Left message on voicemail.

## 2013-10-18 ENCOUNTER — Telehealth: Payer: Self-pay | Admitting: Endocrinology

## 2013-10-18 NOTE — Telephone Encounter (Signed)
Please call patient about reclast   Thank You  Call back: [phone]

## 2013-10-29 ENCOUNTER — Other Ambulatory Visit (HOSPITAL_COMMUNITY): Payer: Self-pay | Admitting: Endocrinology

## 2013-10-29 ENCOUNTER — Encounter (HOSPITAL_COMMUNITY): Payer: Self-pay

## 2013-10-29 ENCOUNTER — Ambulatory Visit (HOSPITAL_COMMUNITY)
Admission: RE | Admit: 2013-10-29 | Discharge: 2013-10-29 | Disposition: A | Discharge status: Home / Self Care | Payer: BC Managed Care – PPO | Source: Ambulatory Visit | Attending: Endocrinology | Admitting: Endocrinology

## 2013-10-29 DIAGNOSIS — M810 Age-related osteoporosis without current pathological fracture: Secondary | ICD-10-CM | POA: Insufficient documentation

## 2013-10-29 MED ORDER — ZOLEDRONIC ACID 5 MG/100ML IV SOLN
5.0000 mg | Freq: Once | INTRAVENOUS | Status: AC
  Administered 2013-10-29: 5 mg via INTRAVENOUS
  Filled 2013-10-29: qty 100

## 2013-10-29 MED ORDER — SODIUM CHLORIDE 0.9 % IV SOLN
Freq: Once | INTRAVENOUS | Status: AC
  Administered 2013-10-29: 14:00:00 via INTRAVENOUS

## 2013-10-29 MED ORDER — ZOLEDRONIC ACID 5 MG/100ML IV SOLN: 5.0000 mg | Freq: Once | INTRAVENOUS | Status: DC

## 2013-10-29 NOTE — Discharge Instructions (Signed)

## 2013-11-21 ENCOUNTER — Encounter (HOSPITAL_BASED_OUTPATIENT_CLINIC_OR_DEPARTMENT_OTHER): Payer: Self-pay | Admitting: Emergency Medicine

## 2013-11-21 ENCOUNTER — Emergency Department (HOSPITAL_BASED_OUTPATIENT_CLINIC_OR_DEPARTMENT_OTHER)
Admission: EM | Admit: 2013-11-21 | Discharge: 2013-11-21 | Disposition: A | Discharge status: Home / Self Care | Payer: BC Managed Care – PPO | Attending: Emergency Medicine | Admitting: Emergency Medicine

## 2013-11-21 DIAGNOSIS — Y9389 Activity, other specified: Secondary | ICD-10-CM | POA: Insufficient documentation

## 2013-11-21 DIAGNOSIS — S61209A Unspecified open wound of unspecified finger without damage to nail, initial encounter: Secondary | ICD-10-CM | POA: Insufficient documentation

## 2013-11-21 DIAGNOSIS — IMO0002 Reserved for concepts with insufficient information to code with codable children: Secondary | ICD-10-CM | POA: Insufficient documentation

## 2013-11-21 DIAGNOSIS — K2970 Gastritis, unspecified, without bleeding: Secondary | ICD-10-CM | POA: Insufficient documentation

## 2013-11-21 DIAGNOSIS — M810 Age-related osteoporosis without current pathological fracture: Secondary | ICD-10-CM | POA: Insufficient documentation

## 2013-11-21 DIAGNOSIS — Z79899 Other long term (current) drug therapy: Secondary | ICD-10-CM | POA: Insufficient documentation

## 2013-11-21 DIAGNOSIS — Z8659 Personal history of other mental and behavioral disorders: Secondary | ICD-10-CM | POA: Insufficient documentation

## 2013-11-21 DIAGNOSIS — Z23 Encounter for immunization: Secondary | ICD-10-CM | POA: Insufficient documentation

## 2013-11-21 DIAGNOSIS — K2990 Gastroduodenitis, unspecified, without bleeding: Secondary | ICD-10-CM

## 2013-11-21 DIAGNOSIS — J45909 Unspecified asthma, uncomplicated: Secondary | ICD-10-CM | POA: Insufficient documentation

## 2013-11-21 DIAGNOSIS — Y929 Unspecified place or not applicable: Secondary | ICD-10-CM | POA: Insufficient documentation

## 2013-11-21 DIAGNOSIS — W268XXA Contact with other sharp object(s), not elsewhere classified, initial encounter: Secondary | ICD-10-CM | POA: Insufficient documentation

## 2013-11-21 DIAGNOSIS — E039 Hypothyroidism, unspecified: Secondary | ICD-10-CM | POA: Insufficient documentation

## 2013-11-21 DIAGNOSIS — Z91040 Latex allergy status: Secondary | ICD-10-CM | POA: Insufficient documentation

## 2013-11-21 DIAGNOSIS — S61219A Laceration without foreign body of unspecified finger without damage to nail, initial encounter: Secondary | ICD-10-CM

## 2013-11-21 MED ORDER — TETANUS-DIPHTH-ACELL PERTUSSIS 5-2.5-18.5 LF-MCG/0.5 IM SUSP
0.5000 mL | Freq: Once | INTRAMUSCULAR | Status: AC
  Administered 2013-11-21: 0.5 mL via INTRAMUSCULAR
  Filled 2013-11-21: qty 0.5

## 2013-11-21 NOTE — ED Notes (Signed)
PA at bedside.

## 2013-11-21 NOTE — ED Notes (Signed)
Laceration to her right pinky. Bleeding controlled, cut on a vegetable slicer.

## 2013-11-21 NOTE — Discharge Instructions (Signed)
Keep wound dry and do not remove dressing for 24 hours if possible. After that, wash gently morning and night (every 12 hours) with soap and water. Use a topical antibiotic ointment and cover with a bandaid or gauze.    Do NOT use rubbing alcohol or hydrogen peroxide, do not soak the area   Present to your primary care doctor or the urgent care of your choice, or the ED for suture removal in 7-10 days.   Every attempt was made to remove foreign body (contaminants) from the wound.  However, there is always a chance that some may remain in the wound. This can  increase your risk of infection.   If you see signs of infection (warmth, redness, tenderness, pus, sharp increase in pain, fever, red streaking in the skin) immediately return to the emergency department.   After the wound heals fully, apply sunscreen for 6-12 months to minimize scarring.    Fingertip Injuries and Amputations Fingertip injuries are common and often get injured because they are last to escape when pulling your hand out of harm's way. You have amputated (cut off) part of your finger. How this turns out depends largely on how much was amputated. If just the tip is amputated, often the end of the finger will grow back and the finger may return to much the same as it was before the injury.  If more of the finger is missing, your caregiver has done the best with the tissue remaining to allow you to keep as much finger as is possible. Your caregiver after checking your injury has tried to leave you with a painless fingertip that has durable, feeling skin. If possible, your caregiver has tried to maintain the finger's length and appearance and preserve its fingernail.  Please read the instructions outlined below and refer to this sheet in the next few weeks. These instructions provide you with general information on caring for yourself. Your caregiver may also give you specific instructions. While your treatment has been done according  to the most current medical practices available, unavoidable complications occasionally occur. If you have any problems or questions after discharge, please call your caregiver. HOME CARE INSTRUCTIONS   You may resume normal diet and activities as directed or allowed.  Keep your hand elevated above the level of your heart. This helps decrease pain and swelling.  Keep ice packs (or a bag of ice wrapped in a towel) on the injured area for 15-20 minutes, 03-04 times per day, for the first two days.  Change dressings if necessary or as directed.  Clean the wound daily or as directed.  Only take over-the-counter or prescription medicines for pain, discomfort, or fever as directed by your caregiver.  Keep appointments as directed. SEEK IMMEDIATE MEDICAL CARE IF:  You develop redness, swelling, numbness or increasing pain in the wound.  There is pus coming from the wound.  You develop an unexplained oral temperature above 102 F (38.9 C) or as your caregiver suggests.  There is a foul (bad) smell coming from the wound or dressing.  There is a breaking open of the wound (edges not staying together) after sutures or staples have been removed. MAKE SURE YOU:   Understand these instructions.  Will watch your condition.  Will get help right away if you are not doing well or get worse. Document Released: 03/13/2005 Document Revised: 07/15/2011 Document Reviewed: 02/10/2008 [facility] Patient Information 2015 [place], [place]. This information is not intended to replace advice given to you  by your health care provider. Make sure you discuss any questions you have with your health care provider. ° °

## 2013-11-21 NOTE — ED Provider Notes (Signed)
CSN: [id number]     Arrival date & time 11/21/13  1214 History   First MD Initiated Contact with Patient 11/21/13 1336     Chief Complaint  Patient presents with  . Extremity Laceration     (Consider location/radiation/quality/duration/timing/severity/associated sxs/prior Treatment) HPI  Theresa Schneider is a 58 y.o. female complaining of laceration to right small finger just prior to arrival after she cut it on a vegetable slicer. Patient denies numbness, reduced range of motion, blood thinners. Last tetanus is unknown. Denies history of diabetes. Patient is right-hand dominant.  Past Medical History  Diagnosis Date  . GAD (generalized anxiety disorder)   . Allergy     SEASONAL/PERENNIAL  . Osteoporosis 2009  . Hallux rigidus   . Hypothyroidism     (since I-131 treatment)  . Hyperthyroidism      hx graves disease. dr Demetrios Isaacs pcp     . Anxiety   . Asthma     allergy related  . Depression   . Tendinitis of right ankle 2007  . Gastritis 2013    from NSAID's after foot surgery   Past Surgical History  Procedure Laterality Date  . Tonsillectomy    . Rotator cuff repair      right  . Hallux rigidus  11/06/2011  . Breast surgery      fibroadenoma, right   Family History  Problem Relation Age of Onset  . Arthritis Mother     OSTEO  . Hypothyroidism Mother   . Heart disease Father 8    first MI at 27; stents at 23; pacemaker at 63  . Heart attack Father 15  . Colon polyps Father     beign  . Colon cancer Paternal Grandfather 65  . Cancer Maternal Grandfather     esophageal  . Esophageal cancer Maternal Grandfather 63  . Breast cancer Paternal Aunt   . Colon polyps Paternal Aunt   . COPD Paternal 58   . Rectal cancer Neg Hx   . Stomach cancer Neg Hx   . Diabetes Brother     obese; pre-diabetic  . Hypothyroidism Sister   . Hypothyroidism Sister   . Osteoporosis Maternal Aunt   . Osteoporosis Maternal Grandmother   . Osteoporosis Cousin    History  Substance  Use Topics  . Smoking status: Never Smoker   . Smokeless tobacco: Never Used  . Alcohol Use: Yes     Comment: occ   OB History   Grav Para Term Preterm Abortions TAB SAB Ect Mult Living   3 3 3  0 0 0 0 0 0 3     Review of Systems  10 systems reviewed and found to be negative, except as noted in the HPI.   Allergies  Codeine phosphate and Latex  Home Medications   Prior to Admission medications   Medication Sig Start Date End Date Taking? Authorizing Provider  albuterol (PROVENTIL HFA;VENTOLIN HFA) 108 (90 BASE) MCG/ACT inhaler Inhale 2 puffs into the lungs every 6 (six) hours as needed. For shortness of breath    Historical Provider, MD  ASTEPRO 0.15 % SOLN  07/13/12   Historical Provider, MD  B Complex Vitamins (VITAMIN-B COMPLEX PO) Take by mouth.    Historical Provider, MD  BEPREVE 1.5 % SOLN  09/03/12   Historical Provider, MD  calcium-vitamin D (OSCAL WITH D) 500-200 MG-UNIT per tablet Take 1 tablet by mouth 2 (two) times daily.      Historical Provider, MD  esomeprazole (NEXIUM) 40 MG  capsule Take 1 capsule (40 mg total) by mouth daily. 02/20/12 02/19/13  Amy S Esterwood, PA-C  Estradiol 10 MCG TABS vaginal tablet Place one tablet vaginally twice per week. 10/04/13   Hiko, MD  levalbuterol [facility]) 45 MCG/ACT inhaler Inhale into the lungs every 4 (four) hours as needed for wheezing.    Historical Provider, MD  levocetirizine (XYZAL) 5 MG tablet  07/23/12   Historical Provider, MD  montelukast (SINGULAIR) 10 MG tablet  07/23/12   Historical Provider, MD  Multiple Vitamins-Minerals (MULTIVITAMIN WITH MINERALS) tablet Take 1 tablet by mouth 2 (two) times daily.      Historical Provider, MD  predniSONE (DELTASONE) 10 MG tablet as needed. 07/12/13   Historical Provider, MD  PRESCRIPTION MEDICATION Inject 1 application into the muscle every 7 (seven) days. Allergy shots weekly    Historical Provider, MD  QVAR 80 MCG/ACT inhaler  09/04/12   Historical  Provider, MD  SYNTHROID 112 MCG tablet TAKE 1 TABLET BY MOUTH EVERY MORNING ON AN EMPTY STOMACH 02/22/13   Elayne Snare, MD  zoledronic acid (RECLAST) 5 MG/100ML SOLN injection Inject 100 mLs (5 mg total) into the vein once. 10/29/13   Elayne Snare, MD   BP 148/89  Pulse 81  Temp(Src) 98 F (36.7 C) (Oral)  Resp 18  Ht 5\' 1"  (1.549 m)  Wt 125 lb (56.7 kg)  BMI 23.63 kg/m2  SpO2 100%  LMP 01/04/2005 Physical Exam  Nursing note and vitals reviewed. Constitutional: She is oriented to person, place, and time. She appears well-developed and well-nourished. No distress.  HENT:  Head: Normocephalic.  Eyes: Conjunctivae and EOM are normal.  Cardiovascular: Normal rate.   Pulmonary/Chest: Effort normal. No stridor.  Musculoskeletal: Normal range of motion.       Hands: Neurovacularly intact with full ROM and strength to each interphalangeal joint (tested in isolation) in both flexion and extension.    Neurological: She is alert and oriented to person, place, and time.  Psychiatric: She has a normal mood and affect.    ED Course  Procedures (including critical care time)  LACERATION REPAIR Performed by: Monico Blitz Consent: Verbal consent obtained. Risks and benefits: risks, benefits and alternatives were discussed Consent given by: patient Patient identity confirmed: Wrist band   Wound explored to depth in good light on a bloodless field, with no foreign bodies seen or palpated.  Prepped and draped in normal sterile fashion    Tetanus: Tdap given  Laceration Location: Right fifth digit distal phalanx ulnar side  Laceration Length: 1 cm  Anesthesia: Digital block  Local anesthetic: 2% without epinephrine  Anesthetic total: 4 ml  Irrigation method: Low Pressure  Amount of cleaning: 1L sterile NS  Skin closure: 6-0 Ethilon  Number of sutures: 3  Technique: Simple interrupted  Patient tolerance: Patient tolerated the procedure well with no immediate complications.     Antibx ointment applied. Instructions for care discussed verbally and patient provided with additional written instructions for homecare and f/u.  Labs Review Labs Reviewed - No data to display  Imaging Review No results found.   EKG Interpretation None      MDM   Final diagnoses:  Laceration of finger, right, initial encounter   Filed Vitals:   11/21/13 1220 11/21/13 1438  BP: 149/96 148/89  Pulse: 93 81  Temp: 98 F (36.7 C)   TempSrc: Oral   Resp: 18 18  Height: 5\' 1"  (1.549 m)   Weight: 125 lb (  56.7 kg)   SpO2: 98% 100%    Medications  Tdap (BOOSTRIX) injection 0.5 mL (0.5 mLs Intramuscular Given 11/21/13 1444)    Theresa Schneider is a 58 y.o. female presenting with finger laceration.  No signs of tendon/joint involvement. Tdap booster given. Pressure irrigation performed. Laceration occurred < 8 hours prior to repair which was well tolerated. Pt has no co morbidities to effect normal wound healing. Discussed suture home care w pt and answered questions. Pt to f-u for wound check and suture removal in 7 days. Pt is hemodynamically stable with no complaints prior to dc.   Evaluation does not show pathology that would require ongoing emergent intervention or inpatient treatment. Pt is hemodynamically stable and mentating appropriately. Discussed findings and plan with patient/guardian, who agrees with care plan. All questions answered. Return precautions discussed and outpatient follow up given.    Monico Blitz, PA-C 11/21/13 1930

## 2013-11-22 NOTE — ED Provider Notes (Signed)
Medical screening examination/treatment/procedure(s) were performed by non-physician practitioner and as supervising physician I was immediately available for consultation/collaboration.   Dot Lanes, MD 11/22/13 1350

## 2013-12-31 ENCOUNTER — Encounter: Payer: Self-pay | Admitting: Obstetrics and Gynecology

## 2014-01-26 ENCOUNTER — Other Ambulatory Visit: Payer: Self-pay | Admitting: Endocrinology

## 2014-02-01 ENCOUNTER — Other Ambulatory Visit: Payer: BC Managed Care – PPO

## 2014-02-04 ENCOUNTER — Ambulatory Visit: Payer: BC Managed Care – PPO | Admitting: Endocrinology

## 2014-03-07 ENCOUNTER — Encounter (HOSPITAL_BASED_OUTPATIENT_CLINIC_OR_DEPARTMENT_OTHER): Payer: Self-pay | Admitting: Emergency Medicine

## 2014-08-30 ENCOUNTER — Other Ambulatory Visit: Payer: Self-pay | Admitting: Endocrinology

## 2014-09-26 ENCOUNTER — Other Ambulatory Visit: Payer: BC Managed Care – PPO

## 2014-09-27 ENCOUNTER — Other Ambulatory Visit (INDEPENDENT_AMBULATORY_CARE_PROVIDER_SITE_OTHER): Payer: BC Managed Care – PPO

## 2014-09-27 DIAGNOSIS — E890 Postprocedural hypothyroidism: Secondary | ICD-10-CM | POA: Diagnosis not present

## 2014-09-27 DIAGNOSIS — E559 Vitamin D deficiency, unspecified: Secondary | ICD-10-CM

## 2014-09-28 LAB — T4, FREE: Free T4: 0.88 ng/dL (ref 0.60–1.60)

## 2014-09-28 LAB — VITAMIN D 25 HYDROXY (VIT D DEFICIENCY, FRACTURES): Vit D, 25-Hydroxy: 34.5 ng/mL (ref 30.0–100.0)

## 2014-09-28 LAB — TSH: TSH: 0.64 u[IU]/mL (ref 0.35–4.50)

## 2014-09-30 ENCOUNTER — Other Ambulatory Visit: Payer: Self-pay | Admitting: *Deleted

## 2014-09-30 ENCOUNTER — Ambulatory Visit (INDEPENDENT_AMBULATORY_CARE_PROVIDER_SITE_OTHER): Payer: BC Managed Care – PPO | Admitting: Endocrinology

## 2014-09-30 ENCOUNTER — Encounter: Payer: Self-pay | Admitting: Endocrinology

## 2014-09-30 VITALS — BP 110/78 | HR 82 | Temp 97.8°F | Resp 14 | Ht 62.75 in | Wt 130.6 lb

## 2014-09-30 DIAGNOSIS — M810 Age-related osteoporosis without current pathological fracture: Secondary | ICD-10-CM

## 2014-09-30 DIAGNOSIS — E890 Postprocedural hypothyroidism: Secondary | ICD-10-CM | POA: Diagnosis not present

## 2014-09-30 MED ORDER — SYNTHROID 112 MCG PO TABS: ORAL_TABLET | ORAL | Status: DC

## 2014-09-30 NOTE — Progress Notes (Signed)
Patient ID: Theresa Schneider, female   DOB: [birth year], 59 y.o.   MRN: [id number]    Reason for Appointment: Follow-up of thyroid and of osteoporosis   History of Present Illness:   Osteoporosis history: She has previously been evaluated and treated by her gynecologist since about 2004. Shewas given a trial of Actonel about 10 years ago. She thinks that she had difficulty tolerating this because of abdominal discomfort and not clear if she took this regularly, probably not over 2 years  She was subsequently given Prolia starting in about 2012 and she got only 3 injections. The injection was stopped about 1-1/2 years ago because of lack of insurance coverage. She got Reclast in 10/2013 since her bone density done by her gynecologist on 09/21/13 showed a reduced T score of -3.5 At the lumbar spine.  She tolerated this well although she thinks her heels hurt after this She went through menopause at the usual age of about 76 and is only taking a vaginal estradiol tablet  VITAMIN D.: She has a recent level of 73, currently taking extra vitamin D 3  HYPOTHYROIDISM: Discussed in review of symptoms    Medication List       This list is accurate as of: 09/30/14 10:11 AM.  Always use your most recent med list.               albuterol 108 (90 BASE) MCG/ACT inhaler  Commonly known as:  PROVENTIL HFA;VENTOLIN HFA  Inhale 2 puffs into the lungs every 6 (six) hours as needed. For shortness of breath     ASTEPRO 0.15 % Soln  Generic drug:  Azelastine HCl     BEPREVE 1.5 % Soln  Generic drug:  Bepotastine Besilate     calcium-vitamin D 500-200 MG-UNIT per tablet  Commonly known as:  OSCAL WITH D  Take 1 tablet by mouth 2 (two) times daily.     Estradiol 10 MCG Tabs vaginal tablet  Place one tablet vaginally twice per week.     levalbuterol 45 MCG/ACT inhaler  Commonly known as:  XOPENEX HFA  Inhale into the lungs every 4 (four) hours as needed for wheezing.     levocetirizine 5 MG tablet   Commonly known as:  XYZAL     montelukast 10 MG tablet  Commonly known as:  SINGULAIR     multivitamin with minerals tablet  Take 1 tablet by mouth 2 (two) times daily.     predniSONE 10 MG tablet  Commonly known as:  DELTASONE  as needed.     PRESCRIPTION MEDICATION  Inject 1 application into the muscle every 7 (seven) days. Allergy shots weekly     QVAR 80 MCG/ACT inhaler  Generic drug:  beclomethasone     SYNTHROID 112 MCG tablet  Generic drug:  levothyroxine  TAKE 1 TABLET BY MOUTH EVERY MORNING ON AN EMPTY STOMACH     VITAMIN-B COMPLEX PO  Take by mouth.     zoledronic acid 5 MG/100ML Soln injection  Commonly known as:  RECLAST  Inject 100 mLs (5 mg total) into the vein once.        Past Medical History  Diagnosis Date  . GAD (generalized anxiety disorder)   . Allergy     SEASONAL/PERENNIAL  . Osteoporosis 2009  . Hallux rigidus   . Hypothyroidism     (since I-131 treatment)  . Hyperthyroidism      hx graves disease. dr Demetrios Isaacs pcp     . Anxiety   .  Asthma     allergy related  . Depression   . Tendinitis of right ankle 2007  . Gastritis 2013    from NSAID's after foot surgery    Past Surgical History  Procedure Laterality Date  . Tonsillectomy    . Rotator cuff repair      right  . Hallux rigidus  11/06/2011  . Breast surgery      fibroadenoma, right    Family History  Problem Relation Age of Onset  . Arthritis Mother     OSTEO  . Hypothyroidism Mother   . Heart disease Father 62    first MI at 39; stents at 55; pacemaker at 22  . Heart attack Father 22  . Colon polyps Father     beign  . Colon cancer Paternal Grandfather 90  . Cancer Maternal Grandfather     esophageal  . Esophageal cancer Maternal Grandfather 74  . Breast cancer Paternal Aunt   . Colon polyps Paternal Aunt   . COPD Paternal 59   . Rectal cancer Neg Hx   . Stomach cancer Neg Hx   . Diabetes Brother     obese; pre-diabetic  . Hypothyroidism Sister   .  Hypothyroidism Sister   . Osteoporosis Maternal Aunt   . Osteoporosis Maternal Grandmother   . Osteoporosis Cousin     Social History:  reports that she has never smoked. She has never used smokeless tobacco. She reports that she drinks alcohol. She reports that she does not use illicit drugs.  Allergies:  Allergies  Allergen Reactions  . Codeine Phosphate Nausea And Vomiting  . Latex Itching    senitivity   ROS   She has gradually gained weight:  Wt Readings from Last 3 Encounters:  09/30/14 130 lb 9.6 oz (59.24 kg)  11/21/13 125 lb (56.7 kg)  10/29/13 122 lb (55.339 kg)    She has hypothyroidism which was first diagnosed after treatment for Graves' disease several years ago She has  done well with Synthroid supplementation and has been taking about the same dose for the last 2-3 years.  Does take the brand-name medication Complaints are reported by the patient now are recently more fatigue but no cold sensitivity Her TSH is now still quite normal and unchanged from a year ago  She has been followed regularly              Compliance with the medical regimen has been as prescribed with taking the tablet in the morning before breakfast.  Lab Results  Component Value Date   FREET4 0.88 09/27/2014   FREET4 1.43 09/28/2013   FREET4 1.09 01/27/2013   TSH 0.64 09/27/2014   TSH 0.787 09/28/2013   TSH 1.93 01/27/2013     Examination:   BP 110/78 mmHg  Pulse 82  Temp(Src) 97.8 F (36.6 C)  Resp 14  Ht 5' 2.75" (1.594 m)  Wt 130 lb 9.6 oz (59.24 kg)  BMI 23.32 kg/m2  SpO2 96%  LMP 01/04/2005      Assessments   Osteoporosis: She is asymptomatic but has a T score of -3.5 along with a family history of osteoporosis  She will get Reclast again for treatment and discussed checking bone density next year to assess stability and probably need long-term treatment Continue vitamin D OTC  Hypothyroidism, post ablative with adequate replacement on current regimen of 112  mcg She is fairly compliant with this medication and will continue the same    [facility] 09/30/2014, 10:11 AM

## 2014-10-12 ENCOUNTER — Ambulatory Visit: Payer: BC Managed Care – PPO | Admitting: Obstetrics and Gynecology

## 2014-10-13 ENCOUNTER — Ambulatory Visit: Payer: BC Managed Care – PPO | Admitting: Obstetrics and Gynecology

## 2014-10-27 ENCOUNTER — Encounter: Payer: Self-pay | Admitting: Internal Medicine

## 2015-04-12 ENCOUNTER — Telehealth: Payer: Self-pay | Admitting: Endocrinology

## 2015-04-12 NOTE — Telephone Encounter (Signed)
Patient is call to schedule the reclast, please advise

## 2015-04-14 ENCOUNTER — Ambulatory Visit (INDEPENDENT_AMBULATORY_CARE_PROVIDER_SITE_OTHER): Payer: BC Managed Care – PPO | Admitting: Medical

## 2015-04-14 ENCOUNTER — Encounter: Payer: Self-pay | Admitting: Medical

## 2015-04-14 VITALS — BP 120/80 | HR 75 | Wt 129.0 lb

## 2015-04-14 DIAGNOSIS — J309 Allergic rhinitis, unspecified: Secondary | ICD-10-CM | POA: Diagnosis not present

## 2015-04-14 DIAGNOSIS — R252 Cramp and spasm: Secondary | ICD-10-CM | POA: Diagnosis not present

## 2015-04-14 DIAGNOSIS — R42 Dizziness and giddiness: Secondary | ICD-10-CM

## 2015-04-14 LAB — POCT URINALYSIS DIPSTICK
Bilirubin, UA: NEGATIVE
Blood, UA: NEGATIVE
Glucose, UA: NEGATIVE
Ketones, UA: NEGATIVE
LEUKOCYTES UA: NEGATIVE
Nitrite, UA: NEGATIVE
Protein, UA: NEGATIVE
SPEC GRAV UA: 1.015
UROBILINOGEN UA: NEGATIVE
pH, UA: 7

## 2015-04-14 LAB — BASIC METABOLIC PANEL
BUN: 14 mg/dL (ref 7–25)
CO2: 27 mmol/L (ref 20–31)
Calcium: 9.6 mg/dL (ref 8.6–10.4)
Chloride: 106 mmol/L (ref 98–110)
Creat: 0.65 mg/dL (ref 0.50–0.99)
GLUCOSE: 86 mg/dL (ref 65–99)
Potassium: 4.2 mmol/L (ref 3.5–5.3)
SODIUM: 142 mmol/L (ref 135–146)

## 2015-04-14 LAB — CBC
HEMATOCRIT: 40.3 % (ref 36.0–46.0)
Hemoglobin: 13.4 g/dL (ref 12.0–15.0)
MCH: 28.9 pg (ref 26.0–34.0)
MCHC: 33.3 g/dL (ref 30.0–36.0)
MCV: 86.9 fL (ref 78.0–100.0)
MPV: 9 fL (ref 8.6–12.4)
Platelets: 416 10*3/uL — ABNORMAL HIGH (ref 150–400)
RBC: 4.64 MIL/uL (ref 3.87–5.11)
RDW: 13.1 % (ref 11.5–15.5)
WBC: 9.4 10*3/uL (ref 4.0–10.5)

## 2015-04-14 MED ORDER — ONDANSETRON HCL 4 MG PO TABS: 4.0000 mg | ORAL_TABLET | Freq: Three times a day (TID) | ORAL | Status: DC | PRN

## 2015-04-14 MED ORDER — MECLIZINE HCL 25 MG PO TABS: 25.0000 mg | ORAL_TABLET | Freq: Two times a day (BID) | ORAL | Status: DC

## 2015-04-14 NOTE — Progress Notes (Signed)
Pt informed

## 2015-04-14 NOTE — Progress Notes (Signed)
Subjective: Chief Complaint  Patient presents with  . Dizziness    gets nausea. states she is extremly exhausted. tried to get up 3 different times and was so dizzy had to get help to get to the bathroom. she said when she lays down or is sitting down for awhile but when she goes to get up or anything she is so dizzy.    When she got up this morning was really dizzy.   Usually jumps right out of bed, but every time she got up this morning was quite dizzy.  Had to sit down a few times.   When not moving its better, but when getting up and moving around is dizzy.  Not current taking gallery pills due to chronic dry eyes, but does get allergy shots.  Thus, she has stopped up head a lot in general, ongoing. Gets nauseated with dizziness.  No vomiting, no GI symptoms.  denies pre-syncope, feels unsteady.  Does have spinning sensation.   Denies chest pain, palpations, dyspnea, no edema.   Thyroid medication dose has been stable.   No head injury.  No fall, no trauma.   Drinking water, could probably do better.  No recent alcohol.   No new medications.  Denies weakness, tingling.  Gets numbness in feet from time to time, not new.  Gets cramps in toes.  No vaginal bleeding.  No hx/o anemia.  Has hx/o asthma, but no recent flare ups.  No hx/o anemia or electrolyte issues. No vision or hearing changes, no tinnitus.  No other aggravating or relieving factors.  No other complaint.  Past Medical History  Diagnosis Date  . GAD (generalized anxiety disorder)   . Allergy     SEASONAL/PERENNIAL  . Osteoporosis 2009  . Hallux rigidus   . Hypothyroidism     (since I-131 treatment)  . Hyperthyroidism      hx graves disease. dr Demetrios Isaacs pcp     . Anxiety   . Asthma     allergy related  . Depression   . Tendinitis of right ankle 2007  . Gastritis 2013    from NSAID's after foot surgery   ROS as in subjective   Objective: BP 120/80 mmHg  Pulse 75  Wt 129 lb (58.514 kg)  SpO2 97%  LMP 01/04/2005  General  appearance: alert, no distress, WD/WN HEENT: normocephalic, sclerae anicteric, PERRLA, EOMi, nares patent, no discharge or erythema, pharynx normal Oral cavity: MMM, no lesions Neck: supple, no lymphadenopathy, no thyromegaly, no masses, no bruits Heart: RRR, normal S1, S2, no murmurs Lungs: CTA bilaterally, no wheezes, rhonchi, or rales Extremities: no edema, no cyanosis, no clubbing Pulses: 2+ symmetric, upper and lower extremities, normal cap refill Neurological: alert, oriented x 3, CN2-12 intact, strength normal upper extremities and lower extremities, sensation normal throughout, DTRs 2+ throughout, no cerebellar signs, gait normal, -rhomberg, normal heel toe walk. Psychiatric: normal affect, behavior normal, pleasant     Adult ECG Report  Indication: dizziness  Rate: 68 bpm  Rhythm: normal sinus rhythm  QRS Axis: 68 degrees  PR Interval: 182ms  QRS Duration: 20ms  QTc: 436ms  Conduction Disturbances: none  Other Abnormalities: none  Patient's cardiac risk factors are: none.  EKG comparison: none  Narrative Interpretation: possible left atrial enlargement, otherwise normal EKG    Assessment: Encounter Diagnoses  Name Primary?  . Dizziness and giddiness Yes  . Cramp of both lower extremities   . Chronic allergic rhinitis     Plan: Discussed symptoms, concerns.  Likely vertigo, but to help reassure and evaluate, EKG reviewed, labs done, UA reviewed.  Discussed hydration, avoiding sudden motions, drinking plenty of water, and getting some electrolytes in the diet this weekend.  We will call with labs.  If labs normal and not improved within the next few days, then call back or recheck.

## 2015-04-27 ENCOUNTER — Other Ambulatory Visit: Payer: Self-pay | Admitting: Medical

## 2015-04-27 NOTE — Telephone Encounter (Signed)
Is this ok to refill?  

## 2015-05-02 ENCOUNTER — Other Ambulatory Visit: Payer: Self-pay

## 2015-05-02 ENCOUNTER — Ambulatory Visit: Payer: BC Managed Care – PPO | Admitting: Endocrinology

## 2015-05-02 DIAGNOSIS — Z1231 Encounter for screening mammogram for malignant neoplasm of breast: Secondary | ICD-10-CM

## 2015-05-02 DIAGNOSIS — M818 Other osteoporosis without current pathological fracture: Secondary | ICD-10-CM

## 2015-05-02 NOTE — Progress Notes (Addendum)
Per written physician order form, Zoledronic Acid injection 5mg /190ml was administered into the R antecubital arm with an 18g needle at 10:20AM.   She reported no discomfort or side effects during the infusion. After 30 min., the infusion was finished and the needle removed at 10:50AM. Site showed no signes of redness or swelling. Pt. Was told to drink plenty of fluids today and tomorrow. We reviewed the side effects and she had no final questions.  Above procedure note is approved  KUMAR,AJAY

## 2015-05-02 NOTE — Progress Notes (Deleted)
Subjective:     Patient ID: Theresa Schneider, female   DOB: 03/29/1956, 59 y.o.   MRN: [id number]  HPI   Review of Systems     Objective:   Physical Exam     Assessment:     ***    Plan:     ***

## 2015-05-05 ENCOUNTER — Encounter: Payer: Self-pay | Admitting: Family Medicine

## 2015-05-05 ENCOUNTER — Ambulatory Visit (INDEPENDENT_AMBULATORY_CARE_PROVIDER_SITE_OTHER): Payer: BC Managed Care – PPO | Admitting: Family Medicine

## 2015-05-05 VITALS — BP 110/74 | HR 78 | Resp 12 | Wt 131.0 lb

## 2015-05-05 DIAGNOSIS — J209 Acute bronchitis, unspecified: Secondary | ICD-10-CM | POA: Diagnosis not present

## 2015-05-05 DIAGNOSIS — J040 Acute laryngitis: Secondary | ICD-10-CM

## 2015-05-05 MED ORDER — AZITHROMYCIN 500 MG PO TABS: 500.0000 mg | ORAL_TABLET | Freq: Every day | ORAL | Status: DC

## 2015-05-05 NOTE — Progress Notes (Signed)
   Subjective:    Patient ID: Theresa Schneider, female    DOB: 11-19-1955, 59 y.o.   MRN: [id number]  HPI She complains of a five-day history this started with sore throat, malaise, dry cough as well as developing a horse voice and chills. She also has some difficulty with sinus pressure as well as right ear congestion. She does not smoke. She does have underlying allergies and is getting shots.   Review of Systems     Objective:   Physical Exam Alert and in no distress. Tympanic membranes and canals are normal. Pharyngeal area is normal. Neck is supple without adenopathy or thyromegaly. Cardiac exam shows a regular sinus rhythm without murmurs or gallops. Lungs are clear to auscultation.        Assessment & Plan:  Acute bronchitis, unspecified organism - Plan: azithromycin (ZITHROMAX) 500 MG tablet  Laryngitis  explained that her symptoms are probably viral in nature and recommended she hold off on taking the antibiotic to see if the couple more days will make a difference and turned the corner. She is comfortable with that.

## 2015-05-11 ENCOUNTER — Telehealth: Payer: Self-pay | Admitting: Family Medicine

## 2015-05-11 MED FILL — SYNTHROID 112 MCG TABLET: 112 | 30 days supply | Qty: 30 | Fill #4

## 2015-05-11 NOTE — Telephone Encounter (Signed)
Tell her to start taking the medication.

## 2015-05-11 NOTE — Telephone Encounter (Signed)
Left message advising patient to start taking medication and call if no better.

## 2015-05-11 NOTE — Telephone Encounter (Signed)
Pt called and is still feeling bad she went back to work and still coughing. She has not taking the antibiotic yet she was wondering if she should start taking the antibiotic. And try a over the counter cough medicine. She thought she would get better by now but is not, she was wanting some advice on what to do. Says the cough gets really bad at night. She did get the antibiotic from the pharmacy just in case she needed. Please advise on what to do.

## 2015-05-29 ENCOUNTER — Ambulatory Visit
Admission: RE | Admit: 2015-05-29 | Discharge: 2015-05-29 | Disposition: A | Discharge status: Home / Self Care | Payer: BC Managed Care – PPO | Source: Ambulatory Visit

## 2015-05-29 DIAGNOSIS — Z1231 Encounter for screening mammogram for malignant neoplasm of breast: Secondary | ICD-10-CM

## 2015-06-09 MED FILL — SYNTHROID 112 MCG TABLET: 112 | 30 days supply | Qty: 30 | Fill #5

## 2015-06-19 MED FILL — FLUTICASONE PROP 50 MCG SPR: 50 | 30 days supply | Qty: 16 | Fill #1

## 2015-06-27 NOTE — Progress Notes (Deleted)
Subjective:     Patient ID: Theresa Schneider, female   DOB: 1955/10/06, 60 y.o.   MRN: [id number]  HPI   Review of Systems     Objective:   Physical Exam     Assessment:     ***    Plan:     ***

## 2015-07-06 MED FILL — SYNTHROID 112 MCG TABLET: 112 | 30 days supply | Qty: 30 | Fill #6

## 2015-08-11 MED FILL — SYNTHROID 112 MCG TABLET: 112 | 30 days supply | Qty: 30 | Fill #7

## 2015-08-13 NOTE — Progress Notes (Signed)
This encounter was created in error - please disregard.

## 2015-09-08 MED FILL — FLUTICASONE PROP 50 MCG SPR: 50 | 30 days supply | Qty: 16 | Fill #2

## 2015-09-08 MED FILL — SYNTHROID 112 MCG TABLET: 112 | 30 days supply | Qty: 30 | Fill #8

## 2015-09-28 ENCOUNTER — Other Ambulatory Visit: Payer: Self-pay | Admitting: *Deleted

## 2015-09-28 ENCOUNTER — Other Ambulatory Visit (INDEPENDENT_AMBULATORY_CARE_PROVIDER_SITE_OTHER): Payer: BC Managed Care – PPO

## 2015-09-28 DIAGNOSIS — E890 Postprocedural hypothyroidism: Secondary | ICD-10-CM

## 2015-09-28 DIAGNOSIS — M810 Age-related osteoporosis without current pathological fracture: Secondary | ICD-10-CM

## 2015-09-28 LAB — TSH: TSH: 1.51 u[IU]/mL (ref 0.35–4.50)

## 2015-09-28 LAB — VITAMIN D 25 HYDROXY (VIT D DEFICIENCY, FRACTURES): VITD: 39.67 ng/mL (ref 30.00–100.00)

## 2015-09-28 LAB — T4, FREE: Free T4: 1.18 ng/dL (ref 0.60–1.60)

## 2015-10-03 ENCOUNTER — Ambulatory Visit (INDEPENDENT_AMBULATORY_CARE_PROVIDER_SITE_OTHER): Payer: BC Managed Care – PPO | Admitting: Endocrinology

## 2015-10-03 ENCOUNTER — Encounter: Payer: Self-pay | Admitting: Endocrinology

## 2015-10-03 VITALS — BP 104/78 | HR 77 | Temp 98.0°F | Resp 14 | Ht 62.75 in | Wt 130.8 lb

## 2015-10-03 DIAGNOSIS — M810 Age-related osteoporosis without current pathological fracture: Secondary | ICD-10-CM

## 2015-10-03 DIAGNOSIS — E890 Postprocedural hypothyroidism: Secondary | ICD-10-CM

## 2015-10-03 NOTE — Progress Notes (Signed)
Patient ID: Theresa Schneider, female   DOB: 03/14/1956, 60 y.o.   MRN: [id number]    Reason for Appointment: Follow-up of thyroid and of osteoporosis   History of Present Illness:   Osteoporosis history: She has previously been evaluated and treated by her gynecologist since about 2004. She was given a trial of Actonel about 10 years ago. She thinks that she had difficulty tolerating this because of abdominal discomfort and not clear if she took this regularly, probably not over 2 years  She was subsequently given Prolia starting in about 2012 and she got only 3 injections. The injection was stopped because of lack of insurance coverage. She went through menopause at the usual age of about 53 and is only taking a vaginal estradiol tablet  She got Reclast in 10/2013 since her bone density done by her gynecologist on 09/21/13 showed a reduced T score of -3.5 at the lumbar spine.  This was repeated in 12/16 without side effects   VITAMIN D.: She has had therapeutic levels and is taking calcium and vitamin D together, usually once or twice a day  Lab Results  Component Value Date   VD25OH 39.67 09/28/2015    HYPOTHYROIDISM: Discussed in review of symptoms    Medication List       This list is accurate as of: 10/03/15 10:08 AM.  Always use your most recent med list.               calcium-vitamin D 500-200 MG-UNIT tablet  Commonly known as:  OSCAL WITH D  Take 1 tablet by mouth 2 (two) times daily.     fluticasone 50 MCG/ACT nasal spray  Commonly known as:  FLONASE     multivitamin with minerals tablet  Take 1 tablet by mouth 2 (two) times daily.     ondansetron 4 MG tablet  Commonly known as:  ZOFRAN  TAKE 1 TABLET BY MOUTH EVERY 8 HOURS AS NEEDED FOR NAUSEA OR VOMITING.     PRESCRIPTION MEDICATION  Inject 1 application into the muscle every 7 (seven) days. Allergy shots weekly     SYNTHROID 112 MCG tablet  Generic drug:  levothyroxine  TAKE 1 TABLET BY MOUTH  EVERY MORNING ON AN EMPTY STOMACH     VITAMIN-B COMPLEX PO  Take by mouth.     zoledronic acid 5 MG/100ML Soln injection  Commonly known as:  RECLAST  Inject 100 mLs (5 mg total) into the vein once.        Past Medical History  Diagnosis Date  . GAD (generalized anxiety disorder)   . Allergy     SEASONAL/PERENNIAL  . Osteoporosis 2009  . Hallux rigidus   . Hypothyroidism     (since I-131 treatment)  . Hyperthyroidism      hx graves disease. dr Demetrios Isaacs pcp     . Anxiety   . Asthma     allergy related  . Depression   . Tendinitis of right ankle 2007  . Gastritis 2013    from NSAID's after foot surgery    Past Surgical History  Procedure Laterality Date  . Tonsillectomy    . Rotator cuff repair      right  . Hallux rigidus  11/06/2011  . Breast surgery      fibroadenoma, right    Family History  Problem Relation Age of Onset  . Arthritis Mother     OSTEO  . Hypothyroidism Mother   . Heart disease Father 77  first MI at 72; stents at 29; pacemaker at 94  . Heart attack Father 73  . Colon polyps Father     beign  . Colon cancer Paternal Grandfather 69  . Cancer Maternal Grandfather     esophageal  . Esophageal cancer Maternal Grandfather 66  . Breast cancer Paternal Aunt   . Colon polyps Paternal Aunt   . COPD Paternal 3   . Rectal cancer Neg Hx   . Stomach cancer Neg Hx   . Diabetes Brother     obese; pre-diabetic  . Hypothyroidism Sister   . Hypothyroidism Sister   . Osteoporosis Maternal Aunt   . Osteoporosis Maternal Grandmother   . Osteoporosis Cousin     Social History:  reports that she has never smoked. She has never used smokeless tobacco. She reports that she drinks alcohol. She reports that she does not use illicit drugs.  Allergies:  Allergies  Allergen Reactions  . Codeine Phosphate Nausea And Vomiting  . Latex Itching    senitivity   ROS   She is asking about symptoms of indigestion and choking and taking Prilosec OTC  She  has Generally a stable weight  Wt Readings from Last 3 Encounters:  10/03/15 130 lb 12.8 oz (59.33 kg)  05/05/15 131 lb (59.421 kg)  04/14/15 129 lb (58.514 kg)    She has hypothyroidism which was first diagnosed after treatment for Graves' disease several years ago She has  done well with Synthroid supplementation and has been taking about the same dose for the last 2-3 years.  Does take the brand-name medication  Complaints are reported by the patient now are no unusual fatigue Her TSH is now still quite normal and stable  She has been followed regularly              Compliance with the medical regimen has been as prescribed with taking the tablet in the morning before breakfast.  Lab Results  Component Value Date   TSH 1.51 09/28/2015   TSH 0.64 09/27/2014   TSH 0.787 09/28/2013   FREET4 1.18 09/28/2015   FREET4 0.88 09/27/2014   FREET4 1.43 09/28/2013      Examination:   Ht 5' 2.75" (1.594 m)  Wt 130 lb 12.8 oz (59.33 kg)  BMI 23.35 kg/m2  LMP 01/04/2005      Assessments   Osteoporosis: She is asymptomatic but has a T score of -3.5 along with a family history of osteoporosis She has had Reclast twice and will need to continue this, next injection will be due in December Vitamin D level to be checked today Continue calcium supplements and vitamin D  Hypothyroidism, post ablative with adequate replacement on current regimen of 112 mcg She is  compliant with this medication and will continue the same  She is avoiding taking any calcium or multivitamins at the same time  ?  Reflux: She is treating with OTC Prilosec but advised her to discuss in detail with PCP So far it has not changed her thyroid levels and she can continue this  Theresa Schneider 10/03/2015, 10:08 AM

## 2015-10-12 ENCOUNTER — Other Ambulatory Visit: Payer: Self-pay | Admitting: Endocrinology

## 2015-10-12 MED FILL — SYNTHROID 112 MCG TABLET: 112 | 30 days supply | Qty: 30 | Fill #0

## 2015-11-14 MED FILL — SYNTHROID 112 MCG TABLET: 112 | 30 days supply | Qty: 30 | Fill #1

## 2015-11-15 ENCOUNTER — Ambulatory Visit (INDEPENDENT_AMBULATORY_CARE_PROVIDER_SITE_OTHER): Payer: BC Managed Care – PPO | Admitting: Family Medicine

## 2015-11-15 ENCOUNTER — Encounter: Payer: Self-pay | Admitting: Family Medicine

## 2015-11-15 VITALS — BP 124/72 | HR 72 | Ht 62.0 in | Wt 126.4 lb

## 2015-11-15 DIAGNOSIS — J309 Allergic rhinitis, unspecified: Secondary | ICD-10-CM | POA: Diagnosis not present

## 2015-11-15 DIAGNOSIS — K219 Gastro-esophageal reflux disease without esophagitis: Secondary | ICD-10-CM | POA: Diagnosis not present

## 2015-11-15 NOTE — Patient Instructions (Signed)
  Drink plenty of water (to keep mucus thin). I agree that reflux is likely contributing to your chest and throat symptoms, but cannot rule out allergies/postnasal drainage also contributing some.  I don't think there is a significant risk for you to use PPI's (prilosec, nexium) short-term to get this under control, and then consider changing to Zantac 150mg  twice daily if needed for longer-term treatment.  You can start with the OTC dose of either nexium or prilosec.  If not effective, you can double up (take both together vs taking it twice daily). I would start with taking the the medication prior to dinner.  You can try taking claritin or zyrtec to treat any possible post nasal drainage component.  Mucinex can also help with that, as well as considering trying sinus rinses.  When switching to zantac (if you do)--be sure to take it before breakfast and before dinner as your twice daily dose (not lunch).  If taken with breakfast (rather than before, due to timing with your synthroid), that is likely just fine.   Food Choices for Gastroesophageal Reflux Disease, Adult When you have gastroesophageal reflux disease (GERD), the foods you eat and your eating habits are very important. Choosing the right foods can help ease the discomfort of GERD. WHAT GENERAL GUIDELINES DO I NEED TO FOLLOW?  Choose fruits, vegetables, whole grains, low-fat dairy products, and low-fat meat, fish, and poultry.  Limit fats such as oils, salad dressings, butter, nuts, and avocado.  Keep a food diary to identify foods that cause symptoms.  Avoid foods that cause reflux. These may be different for different people.  Eat frequent small meals instead of three large meals each day.  Eat your meals slowly, in a relaxed setting.  Limit fried foods.  Cook foods using methods other than frying.  Avoid drinking alcohol.  Avoid drinking large amounts of liquids with your meals.  Avoid bending over or lying down until  2-3 hours after eating. WHAT FOODS ARE NOT RECOMMENDED? The following are some foods and drinks that may worsen your symptoms: Vegetables Tomatoes. Tomato juice. Tomato and spaghetti sauce. Chili peppers. Onion and garlic. Horseradish. Fruits Oranges, grapefruit, and lemon (fruit and juice). Meats High-fat meats, fish, and poultry. This includes hot dogs, ribs, ham, sausage, salami, and bacon. Dairy Whole milk and chocolate milk. Sour cream. Cream. Butter. Ice cream. Cream cheese.  Beverages Coffee and tea, with or without caffeine. Carbonated beverages or energy drinks. Condiments Hot sauce. Barbecue sauce.  Sweets/Desserts Chocolate and cocoa. Donuts. Peppermint and spearmint. Fats and Oils High-fat foods, including [place] fries and potato chips. Other Vinegar. Strong spices, such as black pepper, white pepper, red pepper, cayenne, curry powder, cloves, ginger, and chili powder. The items listed above may not be a complete list of foods and beverages to avoid. Contact your dietitian for more information.   This information is not intended to replace advice given to you by your health care provider. Make sure you discuss any questions you have with your health care provider.   Document Released: 04/22/2005 Document Revised: 05/13/2014 Document Reviewed: 02/24/2013 Elsevier Interactive Patient Education [employer].

## 2015-11-15 NOTE — Progress Notes (Signed)
Chief Complaint  Patient presents with  . Gastroesophageal Reflux    over the last 6 months she has felt "chokey." Thinks she may have some reflux-has been taking some ranitadine and has helped some. Is not really interested in taking daily meds-would like some help ith other things she can do. Doesn't want to be on something like Nexium long term-maybe just for a short while, is concerned about meds damaging her bones because she has osteoporosis.    Busy with work, grandkids, Nutritional therapist health (multiple surgeries)--hasn't paid much attention to herself. She thinks perhaps her symptoms started 6 months ago. She recalls it starting after a dental procedure when she leaned her head back, felt "chokey" in her throat.   She reports having some "choking feeling" intermittently in her throat. She has a sore throat frequently in the mornings.  She denies runny nose or URI symptoms, but has chronic allergies, congestion. She takes allergy shots. She stopped taking Singulair and antihistamines due to issues with dry eyes.  She has indigestion after eating, in the lower chest. And sometimes she wakes up in the middle of the night with reflux symptoms, will take Tums and go back to sleep.  She denies change in her diet--she eats tomatoes all the time (not any increase recently). 1-2 ounces of OJ in the morning, some coffee, no recent change.  She eats a light lunch, a heavier dinner, and can't always wait longer than 2-3 hours after eating before going to bed.  She stopped eating dark chocolate, salsa.  She stopped eating a lot of spicy foods a couple of years ago, it didn't agree with her. Denies dysphagia--swallows large calcium pills without problems.  She started taking zantac 150 mg 2-3 weeks ago.  She doesn't take it in the morning (confusion with when to take it with synthroid)--takes it before dinner, and sometimes before lunch when she remembers when she isn't working.  She thinks it has helped some,  but hasn't cured it. She sometimes takes Tums in the middle of the night with symptoms--not lately.  She hasn't taken any aspirin or NSAIDs; denies abdominal pain, change in bowel habits. Denies nausea.   She is concerned about use of PPI's longterm.  Her mother has done this, and it contributed to her osteoporosis.  PMH, PSH, SH reviewed  Outpatient Encounter Prescriptions as of 11/15/2015  Medication Sig Note  . calcium-vitamin D (OSCAL WITH D) 500-200 MG-UNIT per tablet Take 1 tablet by mouth 2 (two) times daily.     . fluticasone (FLONASE) 50 MCG/ACT nasal spray  10/03/2015: Received from: External Pharmacy  . PRESCRIPTION MEDICATION Inject 1 application into the muscle every 7 (seven) days. Allergy shots weekly   . ranitidine (ZANTAC) 150 MG tablet Take 150 mg by mouth 2 (two) times daily. 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 SYNTHROID 112 MCG tablet TAKE 1 TABLET BY MOUTH EVERY MORNING ON AN EMPTY STOMACH   . zoledronic acid (RECLAST) 5 MG/100ML SOLN injection Inject 100 mLs (5 mg total) into the vein once.   . B Complex Vitamins (VITAMIN-B COMPLEX PO) Take by mouth. Reported on 11/15/2015 11/15/2015: Stopped recently  . Multiple Vitamins-Minerals (MULTIVITAMIN WITH MINERALS) tablet Take 1 tablet by mouth 2 (two) times daily. Reported on 11/15/2015 11/15/2015: Stopped recently with flare of reflux  . [DISCONTINUED] ondansetron (ZOFRAN) 4 MG tablet TAKE 1 TABLET BY MOUTH EVERY 8 HOURS AS NEEDED FOR NAUSEA OR  VOMITING. (Patient not taking: Reported on 10/03/2015)    No facility-administered encounter medications on file as of 11/15/2015.   Allergies  Allergen Reactions  . Codeine Phosphate Nausea And Vomiting  . Latex Itching    senitivity   ROS: no fever, chills, headaches, dizziness, chest pain, palpitations, shortness of breath, cough, nausea, vomiting, bowel changes.  +postnasal drainage, + heartburn/reflux. No  dysphagia.  No urinary complaints.  No joint concerns, bleeding, bruising, rash. No thyroid symptoms--saw Dr. Dwyane Dee and dose was not changed.  PHYSICAL EXAM: BP 124/72 mmHg  Pulse 72  Ht 5\' 2"  (1.575 m)  Wt 126 lb 6.4 oz (57.335 kg)  BMI 23.11 kg/m2  LMP 01/04/2005 Well developed, well appearing, talkative female in no distress HEENT: PERRL, EOMI, conjunctiva and sclera are clear. Nasal mucosa is pale, only mild edema. No drainage noted, just slight white crusting on the right. OP is clear. Sinuses are nontender Neck: no lymphadenopathy or thyromegaly or mass Heart: regular rate and rhythm Lungs: clear bilaterally Back: no spinal or CVA tenderness Abdomen: soft, nontender, nondistended, no organomegaly or mass Extremities: no edema Neuro: alert and oriented, cranial nerves intact, normal strength, gait Psych: normal mood, affect, hygiene and grooming Skin: normal turgor, no rash  ASSESSMENT/PLAN:  Gastroesophageal reflux disease without esophagitis - counseled re: diet, behavioral changes, PPI vs H2 blockers. Change to PPI; eventually change to H2 if long-term needed and effective  Allergic rhinitis, unspecified allergic rhinitis type - continue shots, Flonase. trial of antihistamine and/or mucinex, as this may contribute to throat symptoms  25 min visit, more than 1/2 spent counseling.    Drink plenty of water (to keep mucus thin). I agree that reflux is likely contributing to your chest and throat symptoms, but cannot rule out allergies/postnasal drainage also contributing some.  I don't think there is a significant risk for you to use PPI's (prilosec, nexium) short-term to get this under control, and then consider changing to Zantac 150mg  twice daily if needed for longer-term treatment.  You can start with the OTC dose of either nexium or prilosec.  If not effective, you can double up (take both together vs taking it twice daily). I would start with taking the the medication  prior to dinner.  You can try taking claritin or zyrtec to treat any possible post nasal drainage component.  Mucinex can also help with that, as well as considering trying sinus rinses.  When switching to zantac (if you do)--be sure to take it before breakfast and before dinner as your twice daily dose (not lunch).  If taken with breakfast (rather than before, due to timing with your synthroid), that is likely just fine.

## 2015-12-14 MED FILL — SYNTHROID 112 MCG TABLET: 112 | 30 days supply | Qty: 30 | Fill #2

## 2015-12-14 MED FILL — FLUTICASONE PROP 50 MCG SPR: 50 | 30 days supply | Qty: 16 | Fill #3

## 2015-12-22 ENCOUNTER — Telehealth: Payer: Self-pay

## 2015-12-22 DIAGNOSIS — K219 Gastro-esophageal reflux disease without esophagitis: Secondary | ICD-10-CM

## 2015-12-22 MED ORDER — OMEPRAZOLE 40 MG PO CPDR: DELAYED_RELEASE_CAPSULE | ORAL | 2 refills | Status: DC

## 2015-12-22 MED FILL — OMEPRAZOLE DR 40 MG CAPSULE: 40 | 30 days supply | Qty: 30 | Fill #0

## 2015-12-22 NOTE — Telephone Encounter (Signed)
LMTCB

## 2015-12-22 NOTE — Telephone Encounter (Signed)
Pt states that she has been using OTC medication for reflux with no relief in sx. She has tried omeprazole 20 mg once daily and ranitidine 75mg . Pt states she has tried diet modification and still has reflux. Sleeps sitting up with relief, but her sleep still gets interrupted. Pt questions if we can call in some prescription medication to help with symptoms.    Pt request a callback [phone].   [place] [facility] [employer].  [place] December

## 2015-12-22 NOTE — Telephone Encounter (Signed)
Advise patient that I'm sending rx for 40mg  omeprazole to her pharmacy.  Take it daily before dinner. May increase to twice daily, if not having good control with once daily dosing (if having a lot of reflux symptoms mid-day then take a morning dose before breakfast)

## 2015-12-25 NOTE — Telephone Encounter (Signed)
LMTCB

## 2016-01-16 MED FILL — SYNTHROID 112 MCG TABLET: 112 | 30 days supply | Qty: 30 | Fill #3

## 2016-01-26 MED FILL — OMEPRAZOLE DR 40 MG CAPSULE: 40 | 30 days supply | Qty: 30 | Fill #1

## 2016-02-15 MED FILL — SYNTHROID 112 MCG TABLET: 112 | 30 days supply | Qty: 30 | Fill #4

## 2016-03-20 MED FILL — OMEPRAZOLE DR 40 MG CAPSULE: 40 | 30 days supply | Qty: 30 | Fill #2

## 2016-03-20 MED FILL — SYNTHROID 112 MCG TABLET: 112 | 30 days supply | Qty: 30 | Fill #5

## 2016-04-19 ENCOUNTER — Ambulatory Visit
Admission: RE | Admit: 2016-04-19 | Discharge: 2016-04-19 | Disposition: A | Discharge status: Home / Self Care | Payer: BC Managed Care – PPO | Source: Ambulatory Visit | Attending: Endocrinology | Admitting: Endocrinology

## 2016-04-19 DIAGNOSIS — M810 Age-related osteoporosis without current pathological fracture: Secondary | ICD-10-CM

## 2016-04-22 MED FILL — SYNTHROID 112 MCG TABLET: 112 | 30 days supply | Qty: 30 | Fill #6

## 2016-05-10 MED FILL — EPINEPHRINE 0.3 MG AUTO-INJ: 0.3 | 2 days supply | Qty: 2 | Fill #0

## 2016-05-21 MED FILL — SYNTHROID 112 MCG TABLET: 112 | 30 days supply | Qty: 30 | Fill #7

## 2016-05-31 ENCOUNTER — Telehealth: Payer: Self-pay | Admitting: Endocrinology

## 2016-05-31 NOTE — Telephone Encounter (Signed)
Pt said she has been having injections since her bone density test and she was wondering if Dr. Dwyane Dee wanted her to keep continuing the injections or not.

## 2016-06-03 NOTE — Telephone Encounter (Signed)
She needs to be scheduled for Reclast, due in 12/17. Will also need to have labs drawn for BMP and vitamin D level, diagnosis osteoporosis [name] to schedule

## 2016-06-14 NOTE — Telephone Encounter (Signed)
Patient  Ask Why do she need to see linda?

## 2016-06-14 NOTE — Telephone Encounter (Signed)
Pt is asking for you to please contact her about setting up reclast on Monday AM

## 2016-06-19 ENCOUNTER — Other Ambulatory Visit: Payer: Self-pay | Admitting: Family Medicine

## 2016-06-19 DIAGNOSIS — K219 Gastro-esophageal reflux disease without esophagitis: Secondary | ICD-10-CM

## 2016-06-19 MED FILL — OMEPRAZOLE DR 40 MG CAPSULE: 40 | 30 days supply | Qty: 30 | Fill #0

## 2016-06-19 MED FILL — SYNTHROID 112 MCG TABLET: 112 | 30 days supply | Qty: 30 | Fill #8

## 2016-06-19 NOTE — Telephone Encounter (Signed)
Message left on voicemail to call me to set up labs and appt. For reclast infusion

## 2016-06-27 ENCOUNTER — Telehealth: Payer: Self-pay | Admitting: Endocrinology

## 2016-06-27 NOTE — Telephone Encounter (Signed)
Pt has also made the appt for the labs mentioned in the previous note vitamin d and bmp diagnosis osteoporosis, BMP is in the order section already Vitamin d is needed

## 2016-06-27 NOTE — Telephone Encounter (Signed)
Pt would like to know what the dexa scan says from 12/17, also if according to the dexa scan is the reclast working?  She is going to make an appt for the reclast but wants feedback on this please

## 2016-06-28 ENCOUNTER — Encounter: Payer: Self-pay | Admitting: Endocrinology

## 2016-06-28 NOTE — Telephone Encounter (Signed)
Patient was sent a my chart message, she needs to look at that

## 2016-07-02 NOTE — Progress Notes (Signed)
Chief Complaint  Patient presents with  . Annual Exam    fasting(coffee with mudslide creamer- @ 4am) annual exam with pap. Did not do eye exam, having one in May with Dr. Gershon Crane. Has vaginal dryness and is going to retire in December and would to resume having intercourse. Thinks she may have arthritis, her thumb really hurts when it rains. Hips also hurt-thinks she may have bursitis, would like to possibly have some PT exercises. Has bad reflux and is afraid that meds will diminish bone density.     Theresa Schneider is a 61 y.o. female who presents for a complete physical.  She arrived late (20 min) for her appointment with the above list of concerns. We discussed that wellness issues would be addressed--joint/pain concerns would not.  At her last visit in July, we discussed reflux. She took 27m omeprazole, helped a lot. Elevating head of bed helped, but using pillows, which is bothering her back (husband wouldn't elevated with blocks at the head of the bed).  She is currently using omeprazole prn with foods that trigger, but has some periodic flares.  She doesn't want to take it daily. Tried H2 blocker prior to last visit, not at rx dose, wasn't as helpful Denies dysphagia. Symptoms are "choking", wakes up in the morning with it. Denies dysphagia with eating/drinking.  She has h/o vitamin D deficiency in the past; she recalls being treated with prescription . She is currently taking a MVI daily, and 1000 IU daily. Last level was 39.67.  This is monitored by Dr. KDwyane Dee  Osteoporosis--previously treated with Actonel x 2 years in the distant past, also took Prolia (twice, changed due to cost).  Changed to Reclast for the last 2 years, due for 3rd infusion this year. This is managed by Dr. KDwyane Dee  She states bone density has improved.  She is scheduled to have vitamin D check with him at an upcoming appointment.   She is planning to have Reclast infusion, and is scheduled for labs prior.  H/o Grave's  disease, s/p RAI treatment, with resultant hypothyroidism.  Under the care of Dr. KDwyane Dee She is scheduled to see him again in May. Lab Results  Component Value Date   TSH 1.51 09/28/2015   Allergies:  On immunotherapy, under the care of Dr. SDonneta Romberg  She no longer takes any other medications--the medications dried her eyes out too much.  Some increasing head congestion.  Immunization History  Administered Date(s) Administered  . DTaP 10/05/2006  . Tdap 11/21/2013   Had some itching after a flu shot last year; school wouldn't give this year; Dr. SDonneta Rombergrecommended getting it at his office, but she didn't do this. Last Pap smear: 09/2012, normal with no high risk HPV Last mammogram: 05/2015 Last colonoscopy: 04/2012 Dr. BBronson Ing repeat 10 years Last DEXA: 04/2016  T-2.4 at spine (ordered by Dr. KDwyane Dee Dentist: twice yearly Ophtho: yearly, wears glasses.  Has appointment in May Exercise: walks once a week; walks a lot while at school, between buildings. No weight-bearing exercise. Lipid screen:   Lab Results  Component Value Date   CHOL 188 02/05/2013   HDL 68 02/05/2013   LDLCALC 101 (H) 02/05/2013   TRIG 94 02/05/2013   CHOLHDL 2.8 02/05/2013   Past Medical History:  Diagnosis Date  . Allergy    SEASONAL/PERENNIAL  . Anxiety   . Asthma    allergy related  . Depression   . GAD (generalized anxiety disorder)   . Gastritis 2013  from NSAID's after foot surgery  . Hallux rigidus   . Hyperthyroidism     hx graves disease. dr Demetrios Isaacs pcp     . Hypothyroidism    (since I-131 treatment)  . Osteoporosis 2009  . Tendinitis of right ankle 2007    Past Surgical History:  Procedure Laterality Date  . BREAST SURGERY     fibroadenoma, right  . hallux rigidus  11/06/2011  . ROTATOR CUFF REPAIR     right  . TONSILLECTOMY      Social History   Social History  . Marital status: Married    Spouse name: N/A  . Number of children: 3  . Years of education: N/A    Occupational History  . RADIOLOGY instructor [employer]   Social History Main Topics  . Smoking status: Never Smoker  . Smokeless tobacco: Never Used  . Alcohol use 0.0 oz/week     Comment: a few drinks/year (beach trip with girlfriends, wine at holiday)  . Drug use: No  . Sexual activity: Yes    Partners: Male    Birth control/ protection: Post-menopausal   Other Topics Concern  . 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 Arthritis Mother     OSTEO  . Hypothyroidism Mother   . Heart attack Father 19  . Colon polyps Father     beign  . Heart disease Father     MI at 42; stents and pacemaker 20; CHF  . Hypothyroidism Father   . Diabetes Brother     obese; pre-diabetic  . Heart disease Brother 58    MI  . Hypothyroidism Sister   . Hypothyroidism Sister   . Colon polyps Sister   . Deep vein thrombosis Brother   . Colon cancer Paternal Grandfather 29  . Cancer Maternal Grandfather     esophageal  . Esophageal cancer Maternal Grandfather 45  . Breast cancer Paternal Aunt   . Colon polyps Paternal Aunt   . COPD Paternal 72   . Osteoporosis Paternal Aunt   . Osteoporosis Cousin   . Osteoporosis Paternal Grandmother   . Cancer Maternal Grandmother     leukemia  . Rectal cancer Neg Hx   . Stomach cancer Neg Hx     Outpatient Encounter Prescriptions as of 07/04/2016  Medication Sig Note  . B Complex Vitamins (VITAMIN-B COMPLEX PO) Take by mouth. Reported on 11/15/2015 07/04/2016: 3 times per week-dissolvable type  . Calcium-Magnesium-Vitamin D (CALCIUM 500 PO) Take 1 capsule by mouth 2 (two) times daily.    . cholecalciferol (VITAMIN D) 1000 units tablet Take 1,000 Units by mouth daily.   . Magnesium 500 MG CAPS Take 1 capsule by mouth daily.   Marland Kitchen omeprazole (PRILOSEC) 40 MG capsule TAKE ONE CAPSULE BY MOUTH DAILY BEFORE DINNER   . PRESCRIPTION MEDICATION Inject 1 application into  the muscle every 7 (seven) days. Allergy shots weekly   . SYNTHROID 112 MCG tablet TAKE 1 TABLET BY MOUTH EVERY MORNING ON AN EMPTY STOMACH   . zoledronic acid (RECLAST) 5 MG/100ML SOLN injection Inject 100 mLs (5 mg total) into the vein once.   . [DISCONTINUED] calcium-vitamin D (OSCAL WITH D) 500-200 MG-UNIT per tablet Take 1 tablet by mouth 2 (two) times daily.     . [DISCONTINUED] fluticasone (FLONASE) 50 MCG/ACT nasal spray  10/03/2015: Received from: External Pharmacy  . [DISCONTINUED] Multiple Vitamins-Minerals (  MULTIVITAMIN WITH MINERALS) tablet Take 1 tablet by mouth 2 (two) times daily. Reported on 11/15/2015 11/15/2015: Stopped recently with flare of reflux  . [DISCONTINUED] ranitidine (ZANTAC) 150 MG tablet Take 150 mg by mouth 2 (two) times daily. 11/15/2015: Mostly taking just once a day on the days she teaches, twice daily (lunch/dinner) when working   No facility-administered encounter medications on file as of 07/04/2016.    Uses omeprazole just 1-2x/week prn.  Allergies  Allergen Reactions  . Codeine Phosphate Nausea And Vomiting  . Latex Itching    senitivity   ROS:  The patient denies anorexia, fever, weight changes, headaches,  vision changes, decreased hearing, ear pain, sore throat, breast concerns, chest pain, palpitations, dizziness, syncope, dyspnea on exertion, cough, swelling, nausea, vomiting, diarrhea, constipation, abdominal pain, melena, hematochezia,  hematuria, incontinence, dysuria, vaginal bleeding, discharge, odor or itch, genital lesions, numbness, tingling, weakness, tremor, suspicious skin lesions, depression, anxiety, abnormal bleeding/bruising, or enlarged lymph nodes. Vaginal dryness Thumb and hip pain Chronic congestion Heartburn a few times/week. Denies dysphagia Cystocele and leakage of urine with sneeze/laughing.  She has seen Dr. Gaynelle Arabian in the past, as well as Dr. Quincy Simmonds, who mentioned surgery but she was hesitant.    PHYSICAL EXAM:  BP 120/72  (BP Location: Left Arm, Patient Position: Sitting, Cuff Size: Normal)   Pulse 68   Ht _0  (1.6 m)   Wt 131 lb (59.4 kg)   LMP 01/04/2005   BMI 23.21 kg/m   General Appearance:    Alert, cooperative, talkative female in no distress, appears stated age  Head:    Normocephalic, without obvious abnormality, atraumatic  Eyes:    PERRL, conjunctiva/corneas clear, EOM's intact, fundi    benign  Ears:    Normal TM's and external ear canals  Nose:   Nares normal, mucosa mildly edematous, no drainage or sinus tenderness  Throat:   Lips, mucosa, and tongue normal; teeth and gums normal  Neck:   Supple, no lymphadenopathy;  thyroid:  no   enlargement/tenderness/nodules; no carotid bruit or JVD  Back:    Spine nontender, no curvature, ROM normal, no CVA     tenderness  Lungs:     Clear to auscultation bilaterally without wheezes, rales or     ronchi; respirations unlabored  Chest Wall:    No tenderness or deformity   Heart:    Regular rate and rhythm, S1 and S2 normal, no murmur, rub   or gallop  Breast Exam:    No tenderness, masses, or nipple discharge or inversion.      No axillary lymphadenopathy  Abdomen:     Soft, non-tender, nondistended, normoactive bowel sounds,    no masses, no hepatosplenomegaly  Genitalia:    Normal external genitalia without lesions.  Large cystocele noted. BUS and vagina normal;  no cervical lesions (just slightly friable with pap); no cervical motion tenderness. No abnormal vaginal discharge.  Uterus and adnexa not enlarged, nontender, no masses.  Pap performed  Rectal:    Normal tone, no masses or tenderness; guaiac negative stool  Extremities:   No clubbing, cyanosis or edema  Pulses:   2+ and symmetric all extremities  Skin:   Skin color, texture, turgor normal, no rashes or lesions  Lymph nodes:   Cervical, supraclavicular, and axillary nodes normal  Neurologic:   CNII-XII intact, normal strength, sensation and gait; reflexes 2+ and symmetric throughout           Psych:   Normal mood, affect, hygiene and grooming.  ASSESSMENT/PLAN:  Annual physical exam - Plan: POCT Urinalysis Dipstick, CBC with Differential/Platelet, Lipid panel, Glucose, random, Hepatitis C antibody, Cytology - PAP [place]  Need for hepatitis C screening test - Plan: Hepatitis C antibody  Cystocele without uterine prolapse - symptomatic; encouraged her to f/u with Dr. Quincy Simmonds to discuss treatment options.  Gastroesophageal reflux disease, esophagitis presence not specified - again reveiwed reflux precautions, risks/side effects of PPI vs H2 blocker. Food journal to help her find triggers.  Osteoporosis without current pathological fracture, unspecified osteoporosis type - discussed Ca, Vit D; encouraged weight-bearing exercise. Contineu with Reclast infusion as planned.   b-met, TSH and Vitamin D are ordered by Dr. Dwyane Dee   Discussed monthly self breast exams and yearly mammograms after the age of 37; at least 30 minutes of aerobic activity at least 5 days/week, weight-bearing exercise at least 2x/week; proper sunscreen use reviewed; healthy diet, including goals of calcium and vitamin D intake and alcohol recommendations (less than or equal to 1 drink/day) reviewed; regular seatbelt use; changing batteries in smoke detectors.  Immunization recommendations discussed--yearly flu shots are encouraged--recommended to get at allergist office and take antihistamines prior; Shingrix when available.  Colonoscopy recommendations reviewed, UTD   Reflux--diet reviewed, precautions. PPI vs H2 blockers  CBC, c-met, lipid, Hep C screen Return fasting for labs  Return for further evaluation of her hip pain, which wasn't addressed today.

## 2016-07-04 ENCOUNTER — Other Ambulatory Visit (HOSPITAL_COMMUNITY)
Admission: RE | Admit: 2016-07-04 | Discharge: 2016-07-04 | Disposition: A | Discharge status: Home / Self Care | Payer: BC Managed Care – PPO | Source: Ambulatory Visit | Attending: Family Medicine | Admitting: Family Medicine

## 2016-07-04 ENCOUNTER — Ambulatory Visit (INDEPENDENT_AMBULATORY_CARE_PROVIDER_SITE_OTHER): Payer: BC Managed Care – PPO | Admitting: Family Medicine

## 2016-07-04 ENCOUNTER — Encounter: Payer: Self-pay | Admitting: Family Medicine

## 2016-07-04 VITALS — BP 120/72 | HR 68 | Ht 63.0 in | Wt 131.0 lb

## 2016-07-04 DIAGNOSIS — Z0000 Encounter for general adult medical examination without abnormal findings: Secondary | ICD-10-CM | POA: Diagnosis not present

## 2016-07-04 DIAGNOSIS — M810 Age-related osteoporosis without current pathological fracture: Secondary | ICD-10-CM

## 2016-07-04 DIAGNOSIS — Z01419 Encounter for gynecological examination (general) (routine) without abnormal findings: Secondary | ICD-10-CM | POA: Insufficient documentation

## 2016-07-04 DIAGNOSIS — N8110 Cystocele, unspecified: Secondary | ICD-10-CM

## 2016-07-04 DIAGNOSIS — Z1151 Encounter for screening for human papillomavirus (HPV): Secondary | ICD-10-CM | POA: Diagnosis present

## 2016-07-04 DIAGNOSIS — Z1159 Encounter for screening for other viral diseases: Secondary | ICD-10-CM

## 2016-07-04 DIAGNOSIS — K219 Gastro-esophageal reflux disease without esophagitis: Secondary | ICD-10-CM

## 2016-07-04 LAB — POCT URINALYSIS DIPSTICK
BILIRUBIN UA: NEGATIVE
GLUCOSE UA: NEGATIVE
Ketones, UA: NEGATIVE
Leukocytes, UA: NEGATIVE
NITRITE UA: NEGATIVE
PH UA: 6.5
Protein, UA: NEGATIVE
RBC UA: NEGATIVE
SPEC GRAV UA: 1.015
Urobilinogen, UA: NEGATIVE

## 2016-07-04 NOTE — Telephone Encounter (Addendum)
I reached out to the patient and advised to please look at the [place] message from 06/28/2016 via voicemail.

## 2016-07-04 NOTE — Patient Instructions (Addendum)
HEALTH MAINTENANCE RECOMMENDATIONS:  It is recommended that you get at least 30 minutes of aerobic exercise at least 5 days/week (for weight loss, you may need as much as 60-90 minutes). This can be any activity that gets your heart rate up. This can be divided in 10-15 minute intervals if needed, but try and build up your endurance at least once a week.  Weight bearing exercise is also recommended twice weekly.  Eat a healthy diet with lots of vegetables, fruits and fiber.  "Colorful" foods have a lot of vitamins (ie green vegetables, tomatoes, red peppers, etc).  Limit sweet tea, regular sodas and alcoholic beverages, all of which has a lot of calories and sugar.  Up to 1 alcoholic drink daily may be beneficial for women (unless trying to lose weight, watch sugars).  Drink a lot of water.  Calcium recommendations are 1200-1500 mg daily (1500 mg for postmenopausal women or women without ovaries), and vitamin D 1000 IU daily.  This should be obtained from diet and/or supplements (vitamins), and calcium should not be taken all at once, but in divided doses.  Monthly self breast exams and yearly mammograms for women over the age of 98 is recommended.  Sunscreen of at least SPF 30 should be used on all sun-exposed parts of the skin when outside between the hours of 10 am and 4 pm (not just when at beach or pool, but even with exercise, golf, tennis, and yard work!)  Use a sunscreen that says "broad spectrum" so it covers both UVA and UVB rays, and make sure to reapply every 1-2 hours.  Remember to change the batteries in your smoke detectors when changing your clock times in the spring and fall.  Use your seat belt every time you are in a car, and please drive safely and not be distracted with cell phones and texting while driving.  I recommend getting the new shingles vaccine (Shingrix) when available. You will need to check with your insurance to see if it is covered.  It is a series of 2  injections, spaced 2 months apart.  As discussed, consider trying to keep food journal to determine which foods or behaviors might be triggering your reflux. The blocks at the head of the bed won't bother your back as much as propping yourself up. Try and use the medication prior to meals which will trigger, and prn. Okay to use daily, if reflux is not otherwise controlled.   You can consider zantac 150mg  twice daily vs Pepcid 20mg  twice daily in place of PPI's (prilosec, etc) if you are worried about your bones. Most important for your bones is to start getting regular weight-bearing exercise.    Food Choices for Gastroesophageal Reflux Disease, Adult When you have gastroesophageal reflux disease (GERD), the foods you eat and your eating habits are very important. Choosing the right foods can help ease the discomfort of GERD. Consider working with a diet and nutrition specialist (dietitian) to help you make healthy food choices. What general guidelines should I follow? Eating plan   Choose healthy foods low in fat, such as fruits, vegetables, whole grains, low-fat dairy products, and lean meat, fish, and poultry.  Eat frequent, small meals instead of three large meals each day. Eat your meals slowly, in a relaxed setting. Avoid bending over or lying down until 2-3 hours after eating.  Limit high-fat foods such as fatty meats or fried foods.  Limit your intake of oils, butter, and shortening to less than  8 teaspoons each day.  Avoid the following:  Foods that cause symptoms. These may be different for different people. Keep a food diary to keep track of foods that cause symptoms.  Alcohol.  Drinking large amounts of liquid with meals.  Eating meals during the 2-3 hours before bed.  Cook foods using methods other than frying. This may include baking, grilling, or broiling. Lifestyle    Maintain a healthy weight. Ask your health care provider what weight is healthy for you. If you  need to lose weight, work with your health care provider to do so safely.  Exercise for at least 30 minutes on 5 or more days each week, or as told by your health care provider.  Avoid wearing clothes that fit tightly around your waist and chest.  Do not use any products that contain nicotine or tobacco, such as cigarettes and e-cigarettes. If you need help quitting, ask your health care provider.  Sleep with the head of your bed raised. Use a wedge under the mattress or blocks under the bed frame to raise the head of the bed. What foods are not recommended? The items listed may not be a complete list. Talk with your dietitian about what dietary choices are best for you. Grains  Pastries or quick breads with added fat. [place] toast. Vegetables  Deep fried vegetables. [place] fries. Any vegetables prepared with added fat. Any vegetables that cause symptoms. For some people this may include tomatoes and tomato products, chili peppers, onions and garlic, and horseradish. Fruits  Any fruits prepared with added fat. Any fruits that cause symptoms. For some people this may include citrus fruits, such as oranges, grapefruit, pineapple, and lemons. Meats and other protein foods  High-fat meats, such as fatty beef or pork, hot dogs, ribs, ham, sausage, salami and bacon. Fried meat or protein, including fried fish and fried chicken. Nuts and nut butters. Dairy  Whole milk and chocolate milk. Sour cream. Cream. Ice cream. Cream cheese. Milk shakes. Beverages  Coffee and tea, with or without caffeine. Carbonated beverages. Sodas. Energy drinks. Fruit juice made with acidic fruits (such as orange or grapefruit). Tomato juice. Alcoholic drinks. Fats and oils  Butter. Margarine. Shortening. Ghee. Sweets and desserts  Chocolate and cocoa. Donuts. Seasoning and other foods  Pepper. Peppermint and spearmint. Any condiments, herbs, or seasonings that cause symptoms. For some people, this may include curry,  hot sauce, or vinegar-based salad dressings. Summary  When you have gastroesophageal reflux disease (GERD), food and lifestyle choices are very important to help ease the discomfort of GERD.  Eat frequent, small meals instead of three large meals each day. Eat your meals slowly, in a relaxed setting. Avoid bending over or lying down until 2-3 hours after eating.  Limit high-fat foods such as fatty meat or fried foods. This information is not intended to replace advice given to you by your health care provider. Make sure you discuss any questions you have with your health care provider. Document Released: 04/22/2005 Document Revised: 04/23/2016 Document Reviewed: 04/23/2016 Elsevier Interactive Patient Education  2017 [employer].   Return for another visit to discuss any hip/joint complaints.  I encourage you to follow up with Dr. Quincy Simmonds regarding your symptomatic cystocele, if you aren't able to control your symptoms with behavioral measures and Kegels exercises.

## 2016-07-05 LAB — CYTOLOGY - PAP
DIAGNOSIS: NEGATIVE
HPV (WINDOPATH): NOT DETECTED

## 2016-07-17 ENCOUNTER — Other Ambulatory Visit: Payer: Self-pay | Admitting: Family Medicine

## 2016-07-17 DIAGNOSIS — Z1231 Encounter for screening mammogram for malignant neoplasm of breast: Secondary | ICD-10-CM

## 2016-07-17 MED FILL — FLUTICASONE PROP 50 MCG SPR: 50 | 30 days supply | Qty: 16 | Fill #0

## 2016-07-17 MED FILL — SYNTHROID 112 MCG TABLET: 112 | 30 days supply | Qty: 30 | Fill #9

## 2016-07-23 ENCOUNTER — Other Ambulatory Visit (INDEPENDENT_AMBULATORY_CARE_PROVIDER_SITE_OTHER): Payer: BC Managed Care – PPO

## 2016-07-23 DIAGNOSIS — M810 Age-related osteoporosis without current pathological fracture: Secondary | ICD-10-CM | POA: Diagnosis not present

## 2016-07-23 DIAGNOSIS — E890 Postprocedural hypothyroidism: Secondary | ICD-10-CM

## 2016-07-23 LAB — BASIC METABOLIC PANEL
BUN: 17 mg/dL (ref 6–23)
CALCIUM: 9.8 mg/dL (ref 8.4–10.5)
CHLORIDE: 103 meq/L (ref 96–112)
CO2: 29 meq/L (ref 19–32)
Creatinine, Ser: 0.68 mg/dL (ref 0.40–1.20)
GFR: 92.96 mL/min (ref >60.00)
Glucose, Bld: 89 mg/dL (ref 70–99)
Potassium: 4 mEq/L (ref 3.5–5.1)
SODIUM: 138 meq/L (ref 135–145)

## 2016-07-23 LAB — TSH: TSH: 0.2 u[IU]/mL — AB (ref 0.35–4.50)

## 2016-07-24 NOTE — Progress Notes (Signed)
Please schedule appointment to see me for the thyroid as TSH abnormal.  Okay to proceed with Reclast

## 2016-07-26 ENCOUNTER — Other Ambulatory Visit: Payer: Self-pay

## 2016-07-26 ENCOUNTER — Encounter: Payer: Self-pay | Admitting: Endocrinology

## 2016-07-26 ENCOUNTER — Ambulatory Visit (INDEPENDENT_AMBULATORY_CARE_PROVIDER_SITE_OTHER): Payer: BC Managed Care – PPO | Admitting: Endocrinology

## 2016-07-26 VITALS — BP 140/94 | HR 79 | Ht 62.5 in | Wt 132.0 lb

## 2016-07-26 DIAGNOSIS — E559 Vitamin D deficiency, unspecified: Secondary | ICD-10-CM | POA: Diagnosis not present

## 2016-07-26 DIAGNOSIS — E890 Postprocedural hypothyroidism: Secondary | ICD-10-CM | POA: Diagnosis not present

## 2016-07-26 MED ORDER — LEVOTHYROXINE SODIUM 100 MCG PO TABS: 100.0000 ug | ORAL_TABLET | Freq: Every day | ORAL | 3 refills | Status: DC

## 2016-07-26 NOTE — Progress Notes (Signed)
Patient ID: Theresa Schneider, female   DOB: [birth year], 61 y.o.   MRN: [id number]    Reason for Appointment: Follow-up of thyroid and of osteoporosis   History of Present Illness:   Osteoporosis history: She has previously been evaluated and treated by her gynecologist since about 2004. She was given a trial of Actonel about 10 years ago. She thinks that she had difficulty tolerating this because of abdominal discomfort and not clear if she took this regularly, probably not over 2 years  She was subsequently given Prolia starting in about 2012 and she got only 3 injections. The injection was stopped because of lack of insurance coverage. She went through menopause at the usual age of about 89 and is only taking a vaginal estradiol tablet  She got Reclast in 10/2013 since her bone density done by her gynecologist on 09/21/13 showed a reduced T score of -3.5 at the lumbar spine.  Subsequently she has had Reclast in 04/2015    Repeat bone density was done in 12/17 showing the lumbar spine T-score of -2.4.  There has been a statistically significant increase in BMD of Lumbar spine and left hip since prior exam dated 09/21/2013.    VITAMIN D.: She has had therapeutic levels and is taking calcium and vitamin D together, usually once or twice a day Also on vitamin D3, 1000 U daily   Lab Results  Component Value Date   VD25OH 39.67 09/28/2015    HYPOTHYROIDISM: Discussed in review of symptoms   Allergies as of 07/26/2016      Reactions   Codeine Phosphate Nausea And Vomiting   Latex Itching   senitivity      Medication List       Accurate as of 07/26/16  1:27 PM. Always use your most recent med list.          CALCIUM 500 PO Take 1 capsule by mouth 2 (two) times daily.   cholecalciferol 1000 units tablet Commonly known as:  VITAMIN D Take 1,000 Units by mouth daily.   Magnesium 500 MG Caps Take 1 capsule by mouth daily.   omeprazole 40 MG capsule Commonly known  as:  PRILOSEC TAKE ONE CAPSULE BY MOUTH DAILY BEFORE DINNER   PRESCRIPTION MEDICATION Inject 1 application into the muscle every 7 (seven) days. Allergy shots weekly   SYNTHROID 112 MCG tablet Generic drug:  levothyroxine TAKE 1 TABLET BY MOUTH EVERY MORNING ON AN EMPTY STOMACH   VITAMIN-B COMPLEX PO Take by mouth. Reported on 11/15/2015   zoledronic acid 5 MG/100ML Soln injection Commonly known as:  RECLAST Inject 100 mLs (5 mg total) into the vein once.       Past Medical History:  Diagnosis Date  . Allergy    SEASONAL/PERENNIAL  . Anxiety   . Asthma    allergy related  . Depression   . GAD (generalized anxiety disorder)   . Gastritis 2013   from NSAID's after foot surgery  . Hallux rigidus   . Hyperthyroidism     hx graves disease. dr Theresa Schneider pcp     . Hypothyroidism    (since I-131 treatment)  . Osteoporosis 2009  . Tendinitis of right ankle 2007    Past Surgical History:  Procedure Laterality Date  . BREAST SURGERY     fibroadenoma, right  . hallux rigidus  11/06/2011  . ROTATOR CUFF REPAIR     right  . TONSILLECTOMY      Family History  Problem Relation Age  of Onset  . Arthritis Mother     OSTEO  . Hypothyroidism Mother   . Heart attack Father 66  . Colon polyps Father     beign  . Heart disease Father     MI at 21; stents and pacemaker 55; CHF  . Hypothyroidism Father   . Diabetes Brother     obese; pre-diabetic  . Heart disease Brother 59    MI  . Hypothyroidism Sister   . Hypothyroidism Sister   . Colon polyps Sister   . Deep vein thrombosis Brother   . Colon cancer Paternal Grandfather 40  . Cancer Maternal Grandfather     esophageal  . Esophageal cancer Maternal Grandfather 93  . Breast cancer Paternal Aunt   . Colon polyps Paternal Aunt   . COPD Paternal 41   . Osteoporosis Paternal Aunt   . Osteoporosis Cousin   . Osteoporosis Paternal Grandmother   . Cancer Maternal Grandmother     leukemia  . Rectal cancer Neg Hx   .  Stomach cancer Neg Hx     Social History:  reports that she has never smoked. She has never used smokeless tobacco. She reports that she drinks alcohol. She reports that she does not use drugs.  Allergies:  Allergies  Allergen Reactions  . Codeine Phosphate Nausea And Vomiting  . Latex Itching    senitivity   ROS   She has had some weight change:  Wt Readings from Last 3 Encounters:  07/26/16 132 lb (59.9 kg)  07/04/16 131 lb (59.4 kg)  11/15/15 126 lb 6.4 oz (57.3 kg)    She has hypothyroidism which was first diagnosed after treatment for Graves' disease several years ago She has  done well with Synthroid supplementation and has been taking about the same dose for the last 2-3 years.  Does take the brand-name Synthroid consistently  Complaints are reported by the patient now are no unusual fatigue Her TSH previously had been stable but now it is low She has not changed the way she takes her Synthroid in the morning before breakfast and is not taking her Prilosec until evening and not regularly as before either She does not feel any shakiness or palpitations                 Lab Results  Component Value Date   TSH 0.20 (L) 07/23/2016   TSH 1.51 09/28/2015   TSH 0.64 09/27/2014   FREET4 1.18 09/28/2015   FREET4 0.88 09/27/2014   FREET4 1.43 09/28/2013      Examination:   BP (!) 140/94   Pulse 79   Ht 5' 2.5" (1.588 m)   Wt 132 lb (59.9 kg)   LMP 01/04/2005   SpO2 97%   BMI 23.76 kg/m   Reflexes normal: No tremor     Assessments   Osteoporosis: She is asymptomatic with baseline T score of -3.5 along with a family history of osteoporosis She has had Reclast twice and is scheduled to repeat this in one week She has an improvement in her T score, the last one being -2.4  Vitamin D level to be checked on her next labs Continue calcium supplements and vitamin D meanwhile  Hypothyroidism, post ablative with previously stable TSH on current regimen of 112  mcg Although she is symptomatic her TSH is now 0.2 Will reduce her dose to  100 g daily of brand name Synthroid, discussed need to get TSH normal especially with her osteoporosis history Follow-up  labs in 6 weeks  Theresa Schneider 07/26/2016, 1:27 PM

## 2016-07-30 ENCOUNTER — Ambulatory Visit (INDEPENDENT_AMBULATORY_CARE_PROVIDER_SITE_OTHER): Payer: BC Managed Care – PPO | Admitting: Endocrinology

## 2016-07-30 ENCOUNTER — Encounter: Payer: BC Managed Care – PPO | Attending: Endocrinology | Admitting: Endocrinology

## 2016-07-30 DIAGNOSIS — M810 Age-related osteoporosis without current pathological fracture: Secondary | ICD-10-CM | POA: Diagnosis not present

## 2016-07-30 MED FILL — SYNTHROID 100 MCG TABLET: 100 | 30 days supply | Qty: 30 | Fill #0

## 2016-07-31 NOTE — Patient Instructions (Signed)
Continue to drink plenty of fluids today Continue to take Calcium and Vit. D as directed by Dr. Dwyane Dee

## 2016-07-31 NOTE — Progress Notes (Addendum)
Per Dr. Ronnie Derby message on [facility] Preferred Name:  None Female, 61 y.o., May 26, 1955    Message  Received: 1 week ago  Message Contents  Elayne Snare, MD  Nile Riggs, [place]; Elgie Collard, RN        Please schedule appointment to see me for the thyroid as TSH abnormal. Okay to proceed with Reclast          07/23/16  , and after the patient signed the consent, an IV was inserted into the patient's right antecubital space with a 20G needle.  Some Normal saline was infused at first to determine if the needle was correctly placed, and then 5mg . of Reclast was started at 3:15PM and infused until 3:40PM.  Pt. Reported no discomfort during the infusion.  The IV was flushed with Normal Saline, and the D/C.  The site showed no signs of redness, or infiltration.   She was encouraged to drink plenty of fluids today, and to continue her Calcium and Vit. D as directed by Dr.kumar. She had no final questions  Procedure note is improved  KUMAR,AJAY

## 2016-08-01 ENCOUNTER — Telehealth: Payer: Self-pay

## 2016-08-01 DIAGNOSIS — K219 Gastro-esophageal reflux disease without esophagitis: Secondary | ICD-10-CM

## 2016-08-01 MED ORDER — OMEPRAZOLE 40 MG PO CPDR: DELAYED_RELEASE_CAPSULE | ORAL | 2 refills | Status: DC

## 2016-08-01 MED FILL — OMEPRAZOLE DR 40 MG CAPSULE: 40 | 30 days supply | Qty: 30 | Fill #0

## 2016-08-01 NOTE — Progress Notes (Signed)
Patient ID: Theresa Schneider, female   DOB: [birth year], 61 y.o.   MRN: [id number] Note reviewed regarding Reclast infusion  Theresa Schneider

## 2016-08-01 NOTE — Telephone Encounter (Signed)
Done

## 2016-08-01 NOTE — Telephone Encounter (Signed)
Pt needs refill of omeprazole 40mg  to [facility]. Theresa Schneider

## 2016-08-05 ENCOUNTER — Other Ambulatory Visit: Payer: BC Managed Care – PPO

## 2016-08-05 DIAGNOSIS — Z1159 Encounter for screening for other viral diseases: Secondary | ICD-10-CM

## 2016-08-05 DIAGNOSIS — Z0000 Encounter for general adult medical examination without abnormal findings: Secondary | ICD-10-CM

## 2016-08-05 LAB — LIPID PANEL
Cholesterol: 192 mg/dL (ref <200)
HDL: 70 mg/dL (ref >50)
LDL CALC: 107 mg/dL — AB (ref <100)
Total CHOL/HDL Ratio: 2.7 Ratio (ref <5.0)
Triglycerides: 75 mg/dL (ref <150)
VLDL: 15 mg/dL (ref <30)

## 2016-08-05 LAB — CBC WITH DIFFERENTIAL/PLATELET
BASOS ABS: 83 {cells}/uL (ref 0–200)
Basophils Relative: 1 %
EOS ABS: 249 {cells}/uL (ref 15–500)
Eosinophils Relative: 3 %
HCT: 41.1 % (ref 35.0–45.0)
Hemoglobin: 13.6 g/dL (ref 11.7–15.5)
LYMPHS PCT: 28 %
Lymphs Abs: 2324 cells/uL (ref 850–3900)
MCH: 28.6 pg (ref 27.0–33.0)
MCHC: 33.1 g/dL (ref 32.0–36.0)
MCV: 86.5 fL (ref 80.0–100.0)
MONO ABS: 747 {cells}/uL (ref 200–950)
MONOS PCT: 9 %
MPV: 10 fL (ref 7.5–12.5)
Neutro Abs: 4897 cells/uL (ref 1500–7800)
Neutrophils Relative %: 59 %
Platelets: 432 10*3/uL — ABNORMAL HIGH (ref 140–400)
RBC: 4.75 MIL/uL (ref 3.80–5.10)
RDW: 13.3 % (ref 11.0–15.0)
WBC: 8.3 10*3/uL (ref 4.0–10.5)

## 2016-08-05 LAB — GLUCOSE, RANDOM: Glucose, Bld: 83 mg/dL (ref 65–99)

## 2016-08-06 ENCOUNTER — Ambulatory Visit
Admission: RE | Admit: 2016-08-06 | Discharge: 2016-08-06 | Disposition: A | Discharge status: Home / Self Care | Payer: BC Managed Care – PPO | Source: Ambulatory Visit | Attending: Family Medicine | Admitting: Family Medicine

## 2016-08-06 DIAGNOSIS — Z1231 Encounter for screening mammogram for malignant neoplasm of breast: Secondary | ICD-10-CM

## 2016-08-06 LAB — HEPATITIS C ANTIBODY: HCV Ab: NEGATIVE

## 2016-08-07 ENCOUNTER — Encounter: Payer: Self-pay | Admitting: Endocrinology

## 2016-08-08 ENCOUNTER — Ambulatory Visit (INDEPENDENT_AMBULATORY_CARE_PROVIDER_SITE_OTHER): Payer: BC Managed Care – PPO | Admitting: Family Medicine

## 2016-08-08 ENCOUNTER — Ambulatory Visit: Payer: Self-pay

## 2016-08-08 ENCOUNTER — Encounter: Payer: Self-pay | Admitting: Family Medicine

## 2016-08-08 VITALS — BP 138/84 | HR 72 | Temp 98.0°F | Ht 63.0 in | Wt 131.0 lb

## 2016-08-08 DIAGNOSIS — R1032 Left lower quadrant pain: Secondary | ICD-10-CM | POA: Diagnosis not present

## 2016-08-08 DIAGNOSIS — M545 Low back pain: Secondary | ICD-10-CM | POA: Diagnosis not present

## 2016-08-08 LAB — POCT URINALYSIS DIPSTICK
BILIRUBIN UA: NEGATIVE
GLUCOSE UA: NEGATIVE
Ketones, UA: NEGATIVE
LEUKOCYTES UA: NEGATIVE
NITRITE UA: NEGATIVE
Protein, UA: NEGATIVE
RBC UA: NEGATIVE
Spec Grav, UA: 1.015 (ref 1.030–1.035)
UROBILINOGEN UA: NEGATIVE (ref ?–2.0)
pH, UA: 7.5 (ref 5.0–8.0)

## 2016-08-08 NOTE — Patient Instructions (Signed)
There is no evidence of a bladder infection. Your discomfort possibly could be due to constipation, gas, early diverticular infection or early stomach virus.  If you develop fever, worsening left sided abdominal pain, blood in the stool, you need further evaluation. If you develop diarrhea--be sure to avoid dairy, follow a bland diet (BRAT--bananas, rice, applesauce, toast), drink plenty of water.  Your left buttock discomfort may not be related, possibly is muscular.  Try heat and the stretches shown.  Return next week for recheck if not improving.

## 2016-08-08 NOTE — Progress Notes (Signed)
Chief Complaint  Patient presents with  . Abdominal Pain    left sided LBP that started Monday and is a dull ache. Also some abdominal cramping. Feels nauseous and like she has to have a bowel movement. Feels tired.   Monday, she started with pain in her left buttock, described as a dull ache.  She has also felt queasy/nauseated, pain spread across the low back.  Pain there is also described as a dull ache. She feels crampy in her stomach today, like she has to have a bowel movement. Denies constipation, but reports not having a bowel movement for 1-2 days. Eating less due to decreased appetite.  She was with the grandkids over the weekend--none have been sick that she is aware of.  PMH, PSH, SH reviewed  Outpatient Encounter Prescriptions as of 08/08/2016  Medication Sig Note  . B Complex Vitamins (VITAMIN-B COMPLEX PO) Take by mouth. Reported on 11/15/2015 07/04/2016: 3 times per week-dissolvable type  . Calcium-Magnesium-Vitamin D (CALCIUM 500 PO) Take 1 capsule by mouth 2 (two) times daily.    . cholecalciferol (VITAMIN D) 1000 units tablet Take 1,000 Units by mouth daily.   . fluticasone (FLONASE) 50 MCG/ACT nasal spray Place 1 spray into both nostrils daily.    Marland Kitchen levothyroxine (SYNTHROID) 100 MCG tablet Take 1 tablet (100 mcg total) by mouth daily before breakfast.   . Magnesium 500 MG CAPS Take 1 capsule by mouth daily.   Marland Kitchen PRESCRIPTION MEDICATION Inject 1 application into the muscle every 7 (seven) days. Allergy shots weekly   . zoledronic acid (RECLAST) 5 MG/100ML SOLN injection Inject 100 mLs (5 mg total) into the vein once.   Marland Kitchen omeprazole (PRILOSEC) 40 MG capsule TAKE ONE CAPSULE BY MOUTH DAILY BEFORE DINNER (Patient not taking: Reported on 08/08/2016)   . [DISCONTINUED] levothyroxine (SYNTHROID) 100 MCG tablet Take 100 mcg by mouth daily before breakfast.   . [DISCONTINUED] SYNTHROID 112 MCG tablet     No facility-administered encounter medications on file as of 08/08/2016.     Allergies  Allergen Reactions  . Codeine Phosphate Nausea And Vomiting  . Latex Itching    senitivity   ROS: No fever, chills. No URI symptoms or flare of allergies--some constant congestion, unchanged. Has some SI discomfort, but not flaring or new.  No sciatica. Denies urinary urgency, frequency, dysuria, hematuria h/o UTI's--sometimes starts with achey low back  PHYSICAL EXAM:  BP 138/84 (BP Location: Left Arm, Patient Position: Sitting, Cuff Size: Normal)   Pulse 72   Temp 98 F (36.7 C) (Tympanic)   Ht 5\' 3"  (1.6 m)   Wt 131 lb (59.4 kg)   LMP 01/04/2005   BMI 23.21 kg/m   Well appearing, pleasant female in no distress HEENT: conjunctiva and sclera are clear, OP clear, sinuses nontender Neck: no lymphadenopathy, thyromegaly or mass Heart: regular rate and rhythm Lungs: clear bilaterally Abdomen: active bowel sounds. Mild diffuse tenderness across lower abdomen, L>R.  No rebound or guarding. No hepatosplenomegaly Back: no CVA or spinal tenderness.  No SI tenderness.  Area of discomfort is at left pyriformis/gluteus medius.  She is nontender to palpation here, or elsewhere in the back.  Extremities: no edema Psych: normal mood, affect, hygiene and grooming  Urine dip normal   ASSESSMENT/PLAN:  Abdominal pain, LLQ - benign abdomen; ddx reviewed, including constipation, diverticulitis, gastroenteritis; f/u if sx persist/worsen  Low back pain, unspecified back pain laterality, unspecified chronicity, with sciatica presence unspecified - Plan: POCT Urinalysis Dipstick   Shown pyriformis and  gluteal stretches. Reassured no evidence of urinary tract infection. Ddx of symptoms reviewed  Return if increasing pain, bloody stools, fever, or other new symptoms develop.

## 2016-08-26 MED FILL — SYNTHROID 100 MCG TABLET: 100 | 30 days supply | Qty: 30 | Fill #1

## 2016-09-11 ENCOUNTER — Other Ambulatory Visit: Payer: BC Managed Care – PPO

## 2016-09-16 ENCOUNTER — Other Ambulatory Visit (INDEPENDENT_AMBULATORY_CARE_PROVIDER_SITE_OTHER): Payer: BC Managed Care – PPO

## 2016-09-16 DIAGNOSIS — E890 Postprocedural hypothyroidism: Secondary | ICD-10-CM

## 2016-09-16 DIAGNOSIS — E559 Vitamin D deficiency, unspecified: Secondary | ICD-10-CM | POA: Diagnosis not present

## 2016-09-16 LAB — T4, FREE: FREE T4: 1.2 ng/dL (ref 0.60–1.60)

## 2016-09-16 LAB — TSH: TSH: 0.73 u[IU]/mL (ref 0.35–4.50)

## 2016-09-16 LAB — VITAMIN D 25 HYDROXY (VIT D DEFICIENCY, FRACTURES): VITD: 46.09 ng/mL (ref 30.00–100.00)

## 2016-09-19 ENCOUNTER — Telehealth: Payer: Self-pay | Admitting: Endocrinology

## 2016-09-19 NOTE — Telephone Encounter (Signed)
Patient is calling for the results of her lab work °

## 2016-09-20 NOTE — Telephone Encounter (Signed)
please call patient:'thyroid and Vit-d are normal--good Please continue the same medications. I hope you feel well.

## 2016-09-20 NOTE — Telephone Encounter (Signed)
Called patient and gave her the results from Dr. Loanne Drilling and she wants to know why her thyroid was lower? She is concerned about why her thyroid is increasing in function? Please advise.

## 2016-09-20 NOTE — Telephone Encounter (Signed)
Good morning Dr. Loanne Drilling! Are you able to review the labs and let me know what I can discuss with the patient? Please advise. Thank you!

## 2016-09-22 NOTE — Telephone Encounter (Signed)
Her thyroid test is back to normal and is not unusual to see fluctuation in the dosage requirement with hypothyroidism, nothing to be concerned about

## 2016-09-23 NOTE — Telephone Encounter (Signed)
Called patient and left a message letting her know what Dr. Dwyane Dee said in regards to her thyroid fluctuations.

## 2016-09-24 MED FILL — SYNTHROID 100 MCG TABLET: 100 | 30 days supply | Qty: 30 | Fill #2

## 2016-09-27 ENCOUNTER — Other Ambulatory Visit: Payer: BC Managed Care – PPO

## 2016-10-02 ENCOUNTER — Ambulatory Visit: Payer: BC Managed Care – PPO | Admitting: Endocrinology

## 2016-10-14 ENCOUNTER — Telehealth: Payer: Self-pay | Admitting: Endocrinology

## 2016-10-14 NOTE — Telephone Encounter (Signed)
LM for pt to call back to get her in for an appt soon

## 2016-10-15 MED FILL — OMEPRAZOLE DR 40 MG CAPSULE: 40 | 30 days supply | Qty: 30 | Fill #1

## 2016-10-15 NOTE — Telephone Encounter (Signed)
Patient is scheduled for Friday 10-18-16. Patient wants a call in the morning on why she needs an appointment, there was no specifics in the note to advise patient. Okay to leave a detailed message on mobile phone.

## 2016-10-16 NOTE — Telephone Encounter (Signed)
Please cancel the appointment for 15th.  Her thyroid level is okay recently and she can come back as scheduled in March

## 2016-10-18 ENCOUNTER — Ambulatory Visit: Payer: BC Managed Care – PPO | Admitting: Endocrinology

## 2016-10-25 ENCOUNTER — Other Ambulatory Visit: Payer: Self-pay | Admitting: Endocrinology

## 2016-10-25 MED FILL — SYNTHROID 100 MCG TABLET: 100 | 30 days supply | Qty: 30 | Fill #0

## 2016-11-20 MED FILL — FLUTICASONE PROP 50 MCG SPR: 50 | 30 days supply | Qty: 16 | Fill #1

## 2016-11-29 MED FILL — OMEPRAZOLE DR 40 MG CAPSULE: 40 | 30 days supply | Qty: 30 | Fill #2

## 2016-11-29 MED FILL — SYNTHROID 100 MCG TABLET: 100 | 30 days supply | Qty: 30 | Fill #1

## 2016-12-09 ENCOUNTER — Telehealth: Payer: Self-pay | Admitting: Family Medicine

## 2016-12-09 ENCOUNTER — Ambulatory Visit (INDEPENDENT_AMBULATORY_CARE_PROVIDER_SITE_OTHER): Payer: BC Managed Care – PPO | Admitting: Family Medicine

## 2016-12-09 ENCOUNTER — Encounter: Payer: Self-pay | Admitting: Family Medicine

## 2016-12-09 VITALS — BP 124/80 | HR 82 | Temp 98.1°F | Resp 12 | Ht 63.0 in | Wt 129.0 lb

## 2016-12-09 DIAGNOSIS — Z8249 Family history of ischemic heart disease and other diseases of the circulatory system: Secondary | ICD-10-CM

## 2016-12-09 DIAGNOSIS — R350 Frequency of micturition: Secondary | ICD-10-CM

## 2016-12-09 LAB — POCT URINALYSIS DIP (PROADVANTAGE DEVICE)
BILIRUBIN UA: NEGATIVE
Glucose, UA: NEGATIVE mg/dL
Ketones, POC UA: NEGATIVE mg/dL
Leukocytes, UA: NEGATIVE
NITRITE UA: NEGATIVE
PH UA: 6 (ref 5.0–8.0)
PROTEIN UA: NEGATIVE mg/dL
RBC UA: NEGATIVE
Specific Gravity, Urine: 1.01
UUROB: NEGATIVE

## 2016-12-09 NOTE — Progress Notes (Signed)
Chief Complaint  Patient presents with  . Urinary Frequency    Since going on Vacation    3 mornings ago she noticed urinary frequency while at the beach.  She reports doing a lot of lifting, jumping around with grandkids. Denies dysuria.  Maybe some slight irritation from being in wet bathing suit and being in the surf.  Drank more coffee (3 instead of 2 cups/day, much stronger coffee than her usual).  Just a little alcohol.  Also drank more water than usual while at the beach.  She has h/o chronic UTI's, after 3rd daughter.  No recent infection.  Her platelets were slightly elevated with her last labs. Brother had a DVT.  Her nephew (his son) also had DVT's. She reports they had genetic testing done, but she wasn't aware of any diagnosis or results. She texted a family member but all the info she got was an abnormal D-dimer and on Xarelto. She will investigate further.  PMH, PSH, SH and FH reviewed and updated  Outpatient Encounter Prescriptions as of 12/09/2016  Medication Sig Note  . B Complex Vitamins (VITAMIN-B COMPLEX PO) Take by mouth. Reported on 11/15/2015 07/04/2016: 3 times per week-dissolvable type  . Calcium-Magnesium-Vitamin D (CALCIUM 500 PO) Take 1 capsule by mouth 2 (two) times daily.    . cholecalciferol (VITAMIN D) 1000 units tablet Take 1,000 Units by mouth daily.   . fluticasone (FLONASE) 50 MCG/ACT nasal spray Place 1 spray into both nostrils daily.    Marland Kitchen omeprazole (PRILOSEC) 40 MG capsule TAKE ONE CAPSULE BY MOUTH DAILY BEFORE DINNER   . PRESCRIPTION MEDICATION Inject 1 application into the muscle every 7 (seven) days. Allergy shots weekly   . SYNTHROID 100 MCG tablet TAKE 1 TABLET (100 MCG TOTAL) BY MOUTH DAILY BEFORE BREAKFAST.   Marland Kitchen zoledronic acid (RECLAST) 5 MG/100ML SOLN injection Inject 100 mLs (5 mg total) into the vein once.   . Magnesium 500 MG CAPS Take 1 capsule by mouth daily.    No facility-administered encounter medications on file as of 12/09/2016.     Allergies  Allergen Reactions  . Codeine Phosphate Nausea And Vomiting  . Latex Itching    senitivity    ROS:  Denies any abdominal pain. Denies vaginal discharge, odor, itch. No fever, chills, nausea, vomiting, diarrhea. No chest pain, headaches, dizziness, bleeding, bruising, rash or other complaints  PHYSICAL EXAM:  BP 124/80   Pulse 82   Temp 98.1 F (36.7 C) (Oral)   Resp 12   Ht 5\' 3"  (1.6 m)   Wt 129 lb (58.5 kg)   LMP 01/04/2005   SpO2 99%   BMI 22.85 kg/m   Well appearing, pleasant female, in good spirits HEENT: conjunctiva and sclera are clear, EOMI Neck: no lymphadenopathy or mass Heart: regular rate and rhythm Lungs: clear bilaterally Normal external genitalia without erythema or lesions, no rash Back: No CVA tenderness Abdomen: soft, nontender, no organomegaly or mass Extremities: no edema Psych: normal mood, affect, hygiene and grooming Neuro: alert and oriented, cranial nerves intact, normal gait  Urine dip is normal  ASSESSMENT/PLAN:  Frequent urination - Plan: POCT Urinalysis DIP (Proadvantage Device)  Family history of DVT   Your urine dipstick was clear, not showing evidence of infection.   I suspect your increased frequency was related to increased fluid intake (water) and increased caffeine intake.  Cut back on the caffeine. Stay well hydrated, but resume your normal routine. Contact us if you develop burning with urination, odor, blood in the  urine, abdominal pain, flank pain, fever or other concerns.  Try doing your Kegel's exercises regularly.  If possible, see what the panel/genetic testing showed for you brother and nephew.  I know they had DVT's, but would like to know if they had an underlying cause (examples would be--Factor V Leiden, Protein C or S deficiency, antithrombin antibody, or others). If they have a known diagnosis, please let me know

## 2016-12-09 NOTE — Telephone Encounter (Signed)
See OV from today.  She did not know the results of gene testing

## 2016-12-09 NOTE — Patient Instructions (Addendum)
  Your urine dipstick was clear, not showing evidence of infection.   I suspect your increased frequency was related to increased fluid intake (water) and increased caffeine intake.  Cut back on the caffeine. Stay well hydrated, but resume your normal routine. Contact us if you develop burning with urination, odor, blood in the urine, abdominal pain, flank pain, fever or other concerns.  Try doing your Kegel's exercises regularly.  If possible, see what the panel/genetic testing showed for you brother and nephew.  I know they had DVT's, but would like to know if they had an underlying cause (examples would be--Factor V Leiden, Protein C or S deficiency, antithrombin antibody, or others). If they have a known diagnosis, please let me know

## 2016-12-09 NOTE — Telephone Encounter (Signed)
Pt called and wanted to let you know that her brother did have the gene testing and did have a DVT about a year ago and was hospitalized from it. She wanted to let you know about this states that you and her had discussed this.

## 2016-12-31 MED FILL — SYNTHROID 100 MCG TABLET: 100 | 30 days supply | Qty: 30 | Fill #2

## 2017-01-07 MED FILL — OFLOXACIN 0.3% EYE DROPS: 0.3 | 25 days supply | Qty: 5 | Fill #0

## 2017-01-24 ENCOUNTER — Telehealth: Payer: Self-pay | Admitting: Medical

## 2017-01-24 ENCOUNTER — Encounter: Payer: Self-pay | Admitting: Medical

## 2017-01-24 ENCOUNTER — Ambulatory Visit (INDEPENDENT_AMBULATORY_CARE_PROVIDER_SITE_OTHER): Payer: BC Managed Care – PPO | Admitting: Medical

## 2017-01-24 VITALS — BP 124/70 | HR 63 | Temp 97.7°F | Wt 129.4 lb

## 2017-01-24 DIAGNOSIS — R350 Frequency of micturition: Secondary | ICD-10-CM

## 2017-01-24 DIAGNOSIS — N8110 Cystocele, unspecified: Secondary | ICD-10-CM | POA: Diagnosis not present

## 2017-01-24 DIAGNOSIS — R3989 Other symptoms and signs involving the genitourinary system: Secondary | ICD-10-CM

## 2017-01-24 LAB — POCT URINALYSIS DIP (PROADVANTAGE DEVICE)
Bilirubin, UA: NEGATIVE
Blood, UA: NEGATIVE
Glucose, UA: NEGATIVE mg/dL
Ketones, POC UA: NEGATIVE mg/dL
Leukocytes, UA: NEGATIVE
NITRITE UA: NEGATIVE
PROTEIN UA: NEGATIVE mg/dL
Specific Gravity, Urine: 1.025
Urobilinogen, Ur: NEGATIVE
pH, UA: 6 (ref 5.0–8.0)

## 2017-01-24 NOTE — Telephone Encounter (Signed)
Louretta Shorten, how about the therapist at [facility]

## 2017-01-24 NOTE — Telephone Encounter (Signed)
Pt called PT set her up appt but then called her back because they dont have anyone that does that particular therapy anymore ( that person left and went to [facility]) They tried to find her someone else but could not find anyone in the area  Per pt please advise  Can also send her My Chart message

## 2017-01-24 NOTE — Telephone Encounter (Signed)
Called and spoke  L/m with the therapist at [facility] to get this set up

## 2017-01-24 NOTE — Progress Notes (Signed)
Subjective: Chief Complaint  Patient presents with  . having bladder issues    having pressure in bladder,  freq., pt has an appt with urology 02/20/17   Here for bladder issues.  Wonder if her bladder has fallen. Feels a lot of pressure in pelvis.   Started few months ago. Saw Dr. Tomi Bamberger early august about this.   No improvement since then.    Wonders about things she can do to help.   Feels like she has to urinate all the time.  Some of her normal activities seems to give her worse symptoms, discomfort, fullness.     No urinary odor.   Right before urination gets a pain, but not burning.   Few months ago this pain would improve with urination. Now this pain lasts longer.  Does feel better if resting or legs up/elevated.   Feels this discomfort every time she urinates.   No back pain, no fever, no NVD.   No constipation.  No recent bowel issues.    Drinks some Kambucha, but this seems to agitate the symptoms.   Denies incontinence, however has urgency some times where she feels she may have an accident.  No vaginal discharge.   Married, no concern for STD.    She notes after 3rd child delivery, has had some problems with UTI for about a year, but this resolved.  Was told she had cystocele then, 31 years ago.    Has Urology appt upcoming on 02/20/17.    Past Medical History:  Diagnosis Date  . Allergy    SEASONAL/PERENNIAL  . Anxiety   . Asthma    allergy related  . Depression   . GAD (generalized anxiety disorder)   . Gastritis 2013   from NSAID's after foot surgery  . Hallux rigidus   . Hyperthyroidism     hx graves disease. dr Demetrios Isaacs pcp     . Hypothyroidism    (since I-131 treatment)  . Osteoporosis 2009  . Tendinitis of right ankle 2007   Current Outpatient Prescriptions on File Prior to Visit  Medication Sig Dispense Refill  . B Complex Vitamins (VITAMIN-B COMPLEX PO) Take by mouth. Reported on 11/15/2015    . Calcium-Magnesium-Vitamin D (CALCIUM 500 PO) Take 1 capsule by  mouth 2 (two) times daily.     . cholecalciferol (VITAMIN D) 1000 units tablet Take 1,000 Units by mouth daily.    . fluticasone (FLONASE) 50 MCG/ACT nasal spray Place 1 spray into both nostrils daily.     . Magnesium 500 MG CAPS Take 1 capsule by mouth daily.    Marland Kitchen omeprazole (PRILOSEC) 40 MG capsule TAKE ONE CAPSULE BY MOUTH DAILY BEFORE DINNER 30 capsule 2  . PRESCRIPTION MEDICATION Inject 1 application into the muscle every 7 (seven) days. Allergy shots weekly    . SYNTHROID 100 MCG tablet TAKE 1 TABLET (100 MCG TOTAL) BY MOUTH DAILY BEFORE BREAKFAST. 30 tablet 2  . zoledronic acid (RECLAST) 5 MG/100ML SOLN injection Inject 100 mLs (5 mg total) into the vein once. 100 mL 0   No current facility-administered medications on file prior to visit.       Objective: BP 124/70   Pulse 63   Temp 97.7 F (36.5 C)   Wt 129 lb 6.4 oz (58.7 kg)   LMP 01/04/2005   SpO2 99%   BMI 22.92 kg/m   Wt Readings from Last 3 Encounters:  01/24/17 129 lb 6.4 oz (58.7 kg)  12/09/16 129 lb (58.5 kg)  08/08/16 131 lb (59.4 kg)   Gen: wd, wn, nad Abdomen: +bs, soft, non tender, no mass, no organomegaly Back nontneder Gyn: Normal external genitalia without lesions other than some mild atrophic tissue changes, vagina with normal mucosa, the is a bulge and fullness from bladder pushing down c/t cystocele, otherwise cervix without lesions, no cervical motion tenderness, no abnormal vaginal discharge.  Uterus and adnexa not enlarged, non tender, no masses.  Exam chaperoned by nurse. Rectal: anus normal appearing     Assessment: Encounter Diagnoses  Name Primary?  . Urine frequency Yes  . Sensation of pressure in bladder area   . Female cystocele     Plan: Discussed her concerns, reviewed recent visit with Dr. Tomi Bamberger.  Her symptoms are more persistent and worsening of late.   She has cystocele present on exam.  No apparent uterine prolapse, no obvious mass or abnormality with uterus, ovari, no  rectocele.    UA unremarkable today.     Will send urine culture to rule out infection.  We will try and get her in to pelvic floor physical therapy in the meantime.  F/u with urology as scheduled 02/20/17.   Teea was seen today for having bladder issues.  Diagnoses and all orders for this visit:  Urine frequency -     POCT Urinalysis DIP (Proadvantage Device)  Sensation of pressure in bladder area  Female cystocele

## 2017-01-27 ENCOUNTER — Other Ambulatory Visit: Payer: Self-pay | Admitting: Endocrinology

## 2017-01-27 LAB — URINE CULTURE
MICRO NUMBER:: 81047524
SPECIMEN QUALITY: ADEQUATE

## 2017-01-27 MED FILL — SYNTHROID 100 MCG TABLET: 100 | 30 days supply | Qty: 30 | Fill #0

## 2017-01-28 NOTE — Telephone Encounter (Signed)
Spoke with [facility] and  Was told that the pt had be elevated by the urology first before setting up pt.

## 2017-01-28 NOTE — Telephone Encounter (Signed)
Called and notified pt of this  

## 2017-01-29 ENCOUNTER — Other Ambulatory Visit: Payer: Self-pay | Admitting: Medical

## 2017-01-29 ENCOUNTER — Telehealth: Payer: Self-pay | Admitting: Medical

## 2017-01-29 MED ORDER — CIPROFLOXACIN HCL 500 MG PO TABS: 500.0000 mg | ORAL_TABLET | Freq: Two times a day (BID) | ORAL | 0 refills | Status: AC

## 2017-01-29 NOTE — Telephone Encounter (Signed)
Culture of urine + for infection, is sensitive to Cipro. Have her begin this antibiotic.   C/t plan to see Urology and therapy.

## 2017-01-30 MED FILL — CIPROFLOXACIN HCL 500 MG TA: 500 | 7 days supply | Qty: 14 | Fill #0

## 2017-01-30 NOTE — Telephone Encounter (Signed)
Called and notified pt

## 2017-02-05 ENCOUNTER — Other Ambulatory Visit: Payer: Self-pay | Admitting: Family Medicine

## 2017-02-05 DIAGNOSIS — K219 Gastro-esophageal reflux disease without esophagitis: Secondary | ICD-10-CM

## 2017-02-05 MED FILL — OMEPRAZOLE DR 40 MG CAPSULE: 40 | 30 days supply | Qty: 30 | Fill #0

## 2017-02-10 MED FILL — OFLOXACIN 0.3% EYE DROPS: 0.3 | 25 days supply | Qty: 5 | Fill #1

## 2017-02-19 MED FILL — FLUTICASONE PROP 50 MCG SPR: 50 | 30 days supply | Qty: 16 | Fill #0

## 2017-02-19 MED FILL — AZELASTINE 0.15% NASAL SPRY: 0.15 | 50 days supply | Qty: 30 | Fill #0

## 2017-02-19 MED FILL — MONTELUKAST SOD 10 MG TAB: 10 | 30 days supply | Qty: 30 | Fill #0

## 2017-02-26 MED FILL — PREMARIN VAGINAL CREAM-APPL: 0.625 | 90 days supply | Qty: 30 | Fill #0

## 2017-03-04 MED FILL — SYNTHROID 100 MCG TABLET: 100 | 30 days supply | Qty: 30 | Fill #1

## 2017-03-06 ENCOUNTER — Encounter: Payer: Self-pay | Admitting: *Deleted

## 2017-03-20 ENCOUNTER — Encounter: Payer: Self-pay | Admitting: Family Medicine

## 2017-03-20 ENCOUNTER — Ambulatory Visit: Payer: BC Managed Care – PPO | Admitting: Family Medicine

## 2017-03-20 VITALS — BP 118/72 | HR 80 | Temp 97.7°F | Ht 63.0 in | Wt 128.4 lb

## 2017-03-20 DIAGNOSIS — M545 Low back pain: Secondary | ICD-10-CM

## 2017-03-20 DIAGNOSIS — K529 Noninfective gastroenteritis and colitis, unspecified: Secondary | ICD-10-CM

## 2017-03-20 LAB — POCT URINALYSIS DIP (PROADVANTAGE DEVICE)
BILIRUBIN UA: NEGATIVE
Glucose, UA: NEGATIVE mg/dL
Ketones, POC UA: NEGATIVE mg/dL
Leukocytes, UA: NEGATIVE
NITRITE UA: NEGATIVE
Protein Ur, POC: NEGATIVE mg/dL
RBC UA: NEGATIVE
SPECIFIC GRAVITY, URINE: 1.02
Urobilinogen, Ur: NEGATIVE
pH, UA: 6.5 (ref 5.0–8.0)

## 2017-03-20 NOTE — Progress Notes (Signed)
Chief Complaint  Patient presents with  . Back Pain    started yesterday and is on the left side and across her lower back. UA was negative. She is going back to urologist Nov 29th- Dr. Lovena [facility].    She had diarrhea a few days ago, and felt very fatigued. Last diarrhea was 2 days ago.  Yesterday she was gassy.  She started having discomfort in her left lower back, upper buttock yesterday.  Today she has continued soreness in the low back.  She used heating pad last night, which helped temporarily. Tylenol didn't help. She is scared to try ibuprofen due to her reflux. She has h/o gastritis after foot surgery.  Denies radiation of pain down the leg, no numbness, tingling, weakness or bladder/bowel incontinence. No change in activity.  Back pain is a little better today than it had been.  PMH, PSH, SH reviewed  Outpatient Encounter Medications as of 03/20/2017  Medication Sig Note  . Azelastine HCl 0.15 % SOLN    . B Complex Vitamins (VITAMIN-B COMPLEX PO) Take by mouth. Reported on 11/15/2015 07/04/2016: 3 times per week-dissolvable type  . Calcium-Magnesium-Vitamin D (CALCIUM 500 PO) Take 1 capsule by mouth 2 (two) times daily.    . cholecalciferol (VITAMIN D) 1000 units tablet Take 1,000 Units by mouth daily.   . fluticasone (FLONASE) 50 MCG/ACT nasal spray Place 1 spray into both nostrils daily.    . Magnesium 500 MG CAPS Take 1 capsule by mouth daily.   Marland Kitchen omeprazole (PRILOSEC) 40 MG capsule TAKE ONE CAPSULE BY MOUTH DAILY BEFORE DINNER   . PREMARIN vaginal cream    . PRESCRIPTION MEDICATION Inject 1 application into the muscle every 7 (seven) days. Allergy shots weekly   . SYNTHROID 100 MCG tablet TAKE 1 TABLET (100 MCG TOTAL) BY MOUTH DAILY BEFORE BREAKFAST.   Marland Kitchen zoledronic acid (RECLAST) 5 MG/100ML SOLN injection Inject 100 mLs (5 mg total) into the vein once.    No facility-administered encounter medications on file as of 03/20/2017.    Allergies  Allergen Reactions  .  Codeine Phosphate Nausea And Vomiting  . Latex Itching    senitivity   ROS:  +fatigue, no fever, chills.  Diarrhea resolved. No nausea, vomiting. +sick contacts through work, thinks possible GI bug exposure. No urinary complaints, bleeding, bruising, rash, URI symptoms, chest pain, shortness of breath or other concerns.  PHYSICAL EXAM:  BP 118/72   Pulse 80   Temp 97.7 F (36.5 C) (Oral)   Ht 5\' 3"  (1.6 m)   Wt 128 lb 6.4 oz (58.2 kg)   LMP 01/04/2005   BMI 22.75 kg/m   Well appearing, pleasant female, in no distress Neck: no lymphadenopathy or mass Heart: regular rate and rhythm Lungs: clear bilaterally Back: no CVA tenderness, spinal tenderness or SI joint tenderness.  Area of discomfort was just medial to the SI joint, and extending superiorly.  Nontender to palpation (feels deeper, more internal).  Neuro: normal strength, sensation, DTR's.  Extremities: no edema, normal pulses.  Slight pulling discomfort with pyriformis stretch Skin: no rash Psych: normal mood, affect, hygiene and grooming  Urine dip normal  ASSESSMENT/PLAN:  Low back pain, unspecified back pain laterality, unspecified chronicity, with sciatica presence unspecified - normal exam, suspect gas, GI etiology rather than MSK - Plan: POCT Urinalysis DIP (Proadvantage Device)  Gastroenteritis - resolved, some residual gassiness; avoid dairy, simethicone prn     Your exam is normal. No evidence of any urinary or kidney problems.  It really doesn't feel very musculoskeletal, however, feel free to do the stretches as shown. I would have expected some discomfort during the exam if it was due to muscle/joint issue. I suspect your discomfort is more related to the recent issue with your bowels and gas, and should hopefully resolve. Avoid dairy products for the next 5 days. Use probiotics if needed. Use simethicone if any discomfort recurs. Warm compress and knees to chest/fetal position can also help if having  that sharp gassy pain

## 2017-03-20 NOTE — Patient Instructions (Signed)
   Your exam is normal. No evidence of any urinary or kidney problems. It really doesn't feel very musculoskeletal, however, feel free to do the stretches as shown. I would have expected some discomfort during the exam if it was due to muscle/joint issue. I suspect your discomfort is more related to the recent issue with your bowels and gas, and should hopefully resolve. Avoid dairy products for the next 5 days. Use probiotics if needed. Use simethicone if any discomfort recurs. Warm compress and knees to chest/fetal position can also help if having that sharp gassy pain

## 2017-04-02 MED FILL — FLUTICASONE PROP 50 MCG SPR: 50 | 30 days supply | Qty: 16 | Fill #2

## 2017-04-02 MED FILL — SYNTHROID 100 MCG TABLET: 100 | 30 days supply | Qty: 30 | Fill #2

## 2017-04-02 MED FILL — OMEPRAZOLE DR 40 MG CAPSULE: 40 | 30 days supply | Qty: 30 | Fill #1

## 2017-04-21 ENCOUNTER — Other Ambulatory Visit: Payer: Self-pay | Admitting: Endocrinology

## 2017-04-21 MED FILL — SYNTHROID 100 MCG TABLET: 100 | 30 days supply | Qty: 30 | Fill #0

## 2017-04-22 MED FILL — predniSONE 10 MG TABS: 10 | 4 days supply | Qty: 10 | Fill #0

## 2017-04-22 MED FILL — PROAIR HFA 90 MCG INHALER: 108 (90 BAS | 25 days supply | Qty: 9 | Fill #0

## 2017-04-22 MED FILL — AZITHROMYCIN 250 MG TABLET: 250 | 5 days supply | Qty: 6 | Fill #0

## 2017-05-16 ENCOUNTER — Telehealth: Payer: Self-pay | Admitting: Endocrinology

## 2017-05-16 NOTE — Telephone Encounter (Signed)
Patient notified of MD's message and that [name] is out of the office. Lattie Haw, when Penndel returns to the office will you please send this to her? Thanks!

## 2017-05-16 NOTE — Telephone Encounter (Signed)
See message and please advise, Thanks!  

## 2017-05-16 NOTE — Telephone Encounter (Signed)
Please have [name] scheduled the infusion, probably this is due in March

## 2017-05-16 NOTE — Telephone Encounter (Signed)
We called patient to reschedule her 3/22 appt.   Pt rescheduled labs and appt because pt will be out of town Pt is asking about her infusion that she needs to get done and wants to know when she needs to be scheduled for that appt.    Please advise

## 2017-05-22 MED FILL — MONTELUKAST SOD 10 MG TAB: 10 | 30 days supply | Qty: 30 | Fill #1

## 2017-05-27 ENCOUNTER — Telehealth: Payer: Self-pay | Admitting: Endocrinology

## 2017-05-27 NOTE — Telephone Encounter (Signed)
Pt called in about her labs She is having a reclasp done with [name] and needed labs done before that visit.  She is wanting to know if it is possible to get labs for [facility] and labs for linda done the same day so there arent so many trips to the office.  Please advise

## 2017-05-27 NOTE — Telephone Encounter (Signed)
Left message that we need to do another reclast infusion after 3/27.  Gave times for 08/04/17, and 08/05/17, and told her to call me back with time/date preference

## 2017-05-29 ENCOUNTER — Other Ambulatory Visit: Payer: Self-pay | Admitting: Endocrinology

## 2017-05-29 DIAGNOSIS — E890 Postprocedural hypothyroidism: Secondary | ICD-10-CM

## 2017-05-29 DIAGNOSIS — M810 Age-related osteoporosis without current pathological fracture: Secondary | ICD-10-CM

## 2017-05-29 NOTE — Telephone Encounter (Signed)
Labs have been ordered and she can keep her appointments as already scheduled and confirm Reclast date with [name] also

## 2017-06-05 MED FILL — SYNTHROID 100 MCG TABLET: 100 | 30 days supply | Qty: 30 | Fill #1

## 2017-06-18 MED FILL — AZITHROMYCIN 250 MG TABLET: 250 | 5 days supply | Qty: 6 | Fill #0

## 2017-06-18 MED FILL — predniSONE 10 MG TABS: 10 | 4 days supply | Qty: 10 | Fill #0

## 2017-07-07 MED FILL — SYNTHROID 100 MCG TABLET: 100 | 30 days supply | Qty: 30 | Fill #2

## 2017-07-08 MED FILL — EPINEPHRINE 0.3 MG AUTO-INJ: 0.3 | 1 days supply | Qty: 2 | Fill #0

## 2017-07-08 MED FILL — MONTELUKAST SOD 10 MG TAB: 10 | 30 days supply | Qty: 30 | Fill #2

## 2017-07-12 NOTE — Progress Notes (Addendum)
Chief Complaint  Patient presents with  . Annual Exam    fasting annual exam with pelvic. Husband is having some issues and it's falling on her. (throat cancer and possible broken mandible). Brother and nephew have had DVT's and she has been having some cramps.     Theresa Schneider is a 62 y.o. female who presents for a complete physical.  She has the following concerns:  Husband has throat cancer, and is on liquid diet. She feels sad, but not depressed--they can't go out to dinner, travel, or other plans they had for retirement (she retired). "I'm stressed out".  She gets cramps in her feet/toes, and sometimes in her calves.  She bought a lower of Mg the last time, and wonders if that is contributing.  Cystocele:  Seen last Fall with complaints of urinary frequency. [facility] in [facility], given sample of Vesicare and topical premarin cream (1-2x/wk after daily x 2 wks). She admits she never tried the Vesicare, was having bad reflux (saw that was a side effect, was already having, so didn't start).  She has been using the premarin, but admits to not being very compliant, goes some weeks without it.  She does have some vaginal dryness.  She thinks it helped, when using it regularly.  She has stress urinary incontinence (with sneezing/laughing). Denies urge incontinence.  GERD: She is currently using omeprazole prn with foods that trigger, but has some periodic flares.  She doesn't want to take it daily. Since retiring and eating more earlier in the day, symptoms have improved (had been flaring in the Fall). Tried H2 blocker prior to last visit, not at rx dose, wasn't as helpful Denies dysphagia. Symptoms are "choking", wakes up in the morning with it. Denies these symptoms recently.  She has h/o vitamin D deficiency, monitored by Dr. Dwyane Dee. She is currently taking 1000 IU of D3 daily, and Ca+D once or twice daily. Last level was 46 in 09/2016, she had been taking MVI at that time as  well, but no longer takes it.  Osteoporosis--previously treated with Actonel x 2 years in the distant past, also took Prolia (twice, changed due to cost). She has been getting yearly Reclast infusions since 10/2013; last infusion was through Dr. Dwyane Dee on 07/23/16.  Last DEXA was 04/2016, T-score of -2.4 at the spine, -2.0 at left femoral neck.  H/o Grave's disease, s/p RAI treatment, with resultant hypothyroidism.  Under the care of Dr. Dwyane Dee. She is scheduled to see him again the end of this month. She denies thyroid symptoms. Lab Results  Component Value Date   TSH 0.73 09/16/2016   Allergies:  On immunotherapy, under the care of Dr. Donneta Romberg.  She is on Flonase and montelukast (started after her last sinus infection).  Avoids antihistamines due to contributing to dry eyes.   Immunization History  Administered Date(s) Administered  . DTaP 10/05/2006  . Influenza-Unspecified 02/19/2017  . Tdap 11/21/2013   Last Pap smear: 07/2016, normal with no high risk HPV Last mammogram: 08/2016 Last colonoscopy: 04/2012 Dr. Bronson Ing, repeat 10 years Last DEXA: 04/2016  T-2.4 at spine, -2.0 at left fem neck Dentist: twice yearly Ophtho: yearly, wears glasses. Exercise: she joined the [employer], went 5x since January, not on a regular routine yet since retiring. Lipid screen:   Lab Results  Component Value Date   CHOL 192 08/05/2016   HDL 70 08/05/2016   LDLCALC 107 (H) 08/05/2016   TRIG 75 08/05/2016   CHOLHDL  2.7 08/05/2016    Past Medical History:  Diagnosis Date  . Allergy    SEASONAL/PERENNIAL  . Anxiety   . Asthma    allergy related  . Depression   . GAD (generalized anxiety disorder)   . Gastritis 2013   from NSAID's after foot surgery  . Hallux rigidus   . Hyperthyroidism     hx graves disease. dr Demetrios Isaacs pcp     . Hypothyroidism    (since I-131 treatment)  . Osteoporosis 2009  . Tendinitis of right ankle 2007    Past Surgical History:  Procedure Laterality Date  .  BREAST EXCISIONAL BIOPSY Right 1994   benign   . BREAST SURGERY     fibroadenoma, right  . hallux rigidus  11/06/2011  . ROTATOR CUFF REPAIR     right  . TONSILLECTOMY      Social History   Socioeconomic History  . Marital status: Married    Spouse name: Not on file  . Number of children: 3  . Years of education: Not on file  . Highest education level: Not on file  Social Needs  . Financial resource strain: Not on file  . Food insecurity - worry: Not on file  . Food insecurity - inability: Not on file  . Transportation needs - medical: Not on file  . Transportation needs - non-medical: Not on file  Occupational History  . Occupation: RADIOLOGY Licensed conveyancer: [employer]  Tobacco Use  . Smoking status: Never Smoker  . Smokeless tobacco: Never Used  Substance and Sexual Activity  . Alcohol use: Yes    Alcohol/week: 0.0 oz    Comment: a few drinks/year (beach trip with girlfriends, wine at holiday)  . Drug use: No  . Sexual activity: Yes    Partners: Male    Birth control/protection: Post-menopausal  Other Topics Concern  . 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   Married, husband with throat CA.   3 children, all living in the [place] area, 7 grandchildren    Family History  Problem Relation Age of Onset  . Arthritis Mother        OSTEO  . Hypothyroidism Mother   . Heart attack Father 24  . Colon polyps Father        beign  . Heart disease Father        MI at 28; stents and pacemaker 25; CHF  . Hypothyroidism Father   . Diabetes Brother        obese; pre-diabetic  . Heart disease Brother 39       MI  . Hypothyroidism Sister   . Hypothyroidism Sister   . Colon polyps Sister   . Deep vein thrombosis Brother   . Colon cancer Paternal Grandfather 37  . Cancer Maternal Grandfather        esophageal  . Esophageal cancer Maternal Grandfather 67  . Breast cancer Paternal Aunt   . Colon polyps  Paternal Aunt   . COPD Paternal 37   . Osteoporosis Paternal Aunt   . Osteoporosis Cousin   . Osteoporosis Paternal Grandmother   . Cancer Maternal Grandmother        leukemia  . Deep vein thrombosis Other   . Rectal cancer Neg Hx   . Stomach cancer Neg Hx     Outpatient Encounter Medications as of 07/14/2017  Medication Sig Note  . Azelastine HCl 0.15 % SOLN    .  Calcium-Magnesium-Vitamin D (CALCIUM 500 PO) Take 1 capsule by mouth 2 (two) times daily.    . cholecalciferol (VITAMIN D) 1000 units tablet Take 1,000 Units by mouth daily.   . fluticasone (FLONASE) 50 MCG/ACT nasal spray Place 1 spray into both nostrils daily.    . Magnesium 500 MG CAPS Take 1 capsule by mouth daily.   Marland Kitchen PREMARIN vaginal cream  07/14/2017: Using sporadically (supposed to be 1-2x/week)  . PRESCRIPTION MEDICATION Inject 1 application into the muscle every 7 (seven) days. Allergy shots weekly   . SYNTHROID 100 MCG tablet TAKE 1 TABLET BY MOUTH DAILY BEFORE BREAKFAST.   Marland Kitchen zoledronic acid (RECLAST) 5 MG/100ML SOLN injection Inject 100 mLs (5 mg total) into the vein once. 07/14/2017: Gets once yearly through Dr. Dwyane Dee (due again 07/2017)  . omeprazole (PRILOSEC) 40 MG capsule TAKE ONE CAPSULE BY MOUTH DAILY BEFORE DINNER (Patient not taking: Reported on 07/14/2017) 07/14/2017: Uses prn only, prior to foods which trigger reflux  . [DISCONTINUED] B Complex Vitamins (VITAMIN-B COMPLEX PO) Take by mouth. Reported on 11/15/2015 07/04/2016: 3 times per week-dissolvable type   No facility-administered encounter medications on file as of 07/14/2017.     Allergies  Allergen Reactions  . Codeine Phosphate Nausea And Vomiting  . Latex Itching    senitivity     ROS:  The patient denies anorexia, fever, significant weight changes, headaches,  vision changes, decreased hearing, ear pain, sore throat, breast concerns, chest pain, palpitations, dizziness, syncope, dyspnea on exertion, cough, swelling, nausea, vomiting, diarrhea,  constipation, abdominal pain, melena, hematochezia,  hematuria, dysuria, vaginal bleeding, discharge, odor or itch, genital lesions, numbness, tingling, weakness, tremor, suspicious skin lesions, depression, abnormal bleeding/bruising, or enlarged lymph nodes. Vaginal dryness. No hot flashes. Some hip pain if her head is propped up too much when sleeping; also  worse with seated bike; thumb pain is less than in the past. Chronic congestion/allergies, overall controlled. Heartburn infrequently. Denies dysphagia Cystocele and leakage of urine with sneeze/laughing.    PHYSICAL EXAM:  BP 138/80   Pulse 68   Ht _0  (1.6 m)   Wt 126 lb (57.2 kg)   LMP 01/04/2005   BMI 22.32 kg/m   130/78 on repeat by MD  Wt Readings from Last 3 Encounters:  07/14/17 126 lb (57.2 kg)  03/20/17 128 lb 6.4 oz (58.2 kg)  01/24/17 129 lb 6.4 oz (58.7 kg)    General Appearance:    Alert, cooperative, talkative female in no distress, appears stated age  Head:    Normocephalic, without obvious abnormality, atraumatic  Eyes:    PERRL, conjunctiva/corneas clear, EOM's intact, fundi benign  Ears:    Normal TM's and external ear canals  Nose:   Nares normal, mucosa mildly edematous, no drainage or sinus tenderness  Throat:   Lips, mucosa, and tongue normal; teeth and gums normal  Neck:   Supple, no lymphadenopathy;  thyroid:  no enlargement/tenderness/ nodules; no carotid bruit or JVD  Back:    Spine nontender, no curvature, ROM normal, no CVA tenderness  Lungs:     Clear to auscultation bilaterally without wheezes, rales or ronchi; respirations unlabored  Chest Wall:    No tenderness or deformity   Heart:    Regular rate and rhythm, S1 and S2 normal, no murmur, rub or gallop  Breast Exam:    No tenderness, masses, or nipple discharge or inversion.  No axillary lymphadenopathy  Abdomen:     Soft, non-tender, nondistended, normoactive bowel sounds,    no  masses, no hepatosplenomegaly  Genitalia:    Normal  external genitalia without lesions, atrophic changes noted.  Moderate cystocele noted. BUS and vagina normal; no cervical motion tenderness. No abnormal vaginal discharge.  Uterus and adnexa not enlarged, nontender, no masses.  Pap not performed  Rectal:    Normal tone, no masses or tenderness; guaiac negative stool  Extremities:   No clubbing, cyanosis or edema  Pulses:   2+ and symmetric all extremities  Skin:   Skin color, texture, turgor normal, no rashes or lesions  Lymph nodes:   Cervical, supraclavicular, and axillary nodes normal  Neurologic:   CNII-XII intact, normal strength, sensation and gait; reflexes 2+ and symmetric throughout                                Psych:   Normal mood, affect, hygiene and grooming.   ASSESSMENT/PLAN:  Annual physical exam - Plan: POCT Urinalysis DIP (Proadvantage Device), CBC with Differential/Platelet  Gastroesophageal reflux disease, esophagitis presence not specified - improved since retiring, continue proper diet and prn meds  Cystocele without uterine prolapse - minimally symptomatic. Continue premarin for atrophic vaginitis  Osteoporosis without current pathological fracture, unspecified osteoporosis type - continue reclast infusions yearly, calcium, vitamin D and weight-bearing exercise  Postablative hypothyroidism - euthyroid by history, labs monitored by Dr. Dwyane Dee, due soon  Elevated platelet count - Plan: CBC with Differential/Platelet  Stress due to illness of family member - counseled in detail, encouraged formal counseling as well  Stress incontinence in female - instructed on Kegel's exercises   H/o mildly elevated plt--check CBC Vitamin D, c-met and thyroid studies are ordered for Dr. Dwyane Dee in the near future.  Discussed monthly self breast exams and yearly mammograms; at least 30 minutes of aerobic activity at least 5 days/week, weight-bearing exercise at least 2x/week; proper sunscreen use reviewed; healthy diet, including  goals of calcium and vitamin D intake and alcohol recommendations (less than or equal to 1 drink/day) reviewed; regular seatbelt use; changing batteries in smoke detectors.  Immunization recommendations discussed--continue yearly flu shots. Shingrix recommended, risks/side effects reviewed. To check her insurance and schedule NV if desired. Colonoscopy recommendations reviewed, UTD. Pap UTD.  F/u 1 year, sooner prn

## 2017-07-14 ENCOUNTER — Ambulatory Visit: Payer: BC Managed Care – PPO | Admitting: Family Medicine

## 2017-07-14 ENCOUNTER — Encounter: Payer: Self-pay | Admitting: Family Medicine

## 2017-07-14 VITALS — BP 130/78 | HR 68 | Ht 63.0 in | Wt 126.0 lb

## 2017-07-14 DIAGNOSIS — R7989 Other specified abnormal findings of blood chemistry: Secondary | ICD-10-CM

## 2017-07-14 DIAGNOSIS — N8110 Cystocele, unspecified: Secondary | ICD-10-CM | POA: Diagnosis not present

## 2017-07-14 DIAGNOSIS — Z6379 Other stressful life events affecting family and household: Secondary | ICD-10-CM | POA: Diagnosis not present

## 2017-07-14 DIAGNOSIS — N393 Stress incontinence (female) (male): Secondary | ICD-10-CM | POA: Diagnosis not present

## 2017-07-14 DIAGNOSIS — M810 Age-related osteoporosis without current pathological fracture: Secondary | ICD-10-CM

## 2017-07-14 DIAGNOSIS — Z0000 Encounter for general adult medical examination without abnormal findings: Secondary | ICD-10-CM | POA: Diagnosis not present

## 2017-07-14 DIAGNOSIS — E890 Postprocedural hypothyroidism: Secondary | ICD-10-CM | POA: Diagnosis not present

## 2017-07-14 DIAGNOSIS — K219 Gastro-esophageal reflux disease without esophagitis: Secondary | ICD-10-CM | POA: Diagnosis not present

## 2017-07-14 LAB — POCT URINALYSIS DIP (PROADVANTAGE DEVICE)
BILIRUBIN UA: NEGATIVE
GLUCOSE UA: NEGATIVE mg/dL
Ketones, POC UA: NEGATIVE mg/dL
LEUKOCYTES UA: NEGATIVE
NITRITE UA: NEGATIVE
Protein Ur, POC: NEGATIVE mg/dL
RBC UA: NEGATIVE
Specific Gravity, Urine: 1.015
Urobilinogen, Ur: NEGATIVE
pH, UA: 7.5 (ref 5.0–8.0)

## 2017-07-14 LAB — CBC WITH DIFFERENTIAL/PLATELET
BASOS: 0 %
Basophils Absolute: 0 10*3/uL (ref 0.0–0.2)
EOS (ABSOLUTE): 0.1 10*3/uL (ref 0.0–0.4)
EOS: 1 %
HEMATOCRIT: 40.6 % (ref 34.0–46.6)
Hemoglobin: 13.4 g/dL (ref 11.1–15.9)
IMMATURE GRANULOCYTES: 0 %
Immature Grans (Abs): 0 10*3/uL (ref 0.0–0.1)
LYMPHS: 27 %
Lymphocytes Absolute: 2.6 10*3/uL (ref 0.7–3.1)
MCH: 28.8 pg (ref 26.6–33.0)
MCHC: 33 g/dL (ref 31.5–35.7)
MCV: 87 fL (ref 79–97)
MONOS ABS: 1.3 10*3/uL — AB (ref 0.1–0.9)
Monocytes: 14 %
NEUTROS PCT: 58 %
Neutrophils Absolute: 5.6 10*3/uL (ref 1.4–7.0)
PLATELETS: 392 10*3/uL — AB (ref 150–379)
RBC: 4.65 x10E6/uL (ref 3.77–5.28)
RDW: 14.4 % (ref 12.3–15.4)
WBC: 9.6 10*3/uL (ref 3.4–10.8)

## 2017-07-14 NOTE — Patient Instructions (Addendum)
HEALTH MAINTENANCE RECOMMENDATIONS:  It is recommended that you get at least 30 minutes of aerobic exercise at least 5 days/week (for weight loss, you may need as much as 60-90 minutes). This can be any activity that gets your heart rate up. This can be divided in 10-15 minute intervals if needed, but try and build up your endurance at least once a week.  Weight bearing exercise is also recommended twice weekly.  Eat a healthy diet with lots of vegetables, fruits and fiber.  "Colorful" foods have a lot of vitamins (ie green vegetables, tomatoes, red peppers, etc).  Limit sweet tea, regular sodas and alcoholic beverages, all of which has a lot of calories and sugar.  Up to 1 alcoholic drink daily may be beneficial for women (unless trying to lose weight, watch sugars).  Drink a lot of water.  Calcium recommendations are 1200-1500 mg daily (1500 mg for postmenopausal women or women without ovaries), and vitamin D 1000 IU daily.  This should be obtained from diet and/or supplements (vitamins), and calcium should not be taken all at once, but in divided doses.  Monthly self breast exams and yearly mammograms for women over the age of 72 is recommended.  Sunscreen of at least SPF 30 should be used on all sun-exposed parts of the skin when outside between the hours of 10 am and 4 pm (not just when at beach or pool, but even with exercise, golf, tennis, and yard work!)  Use a sunscreen that says "broad spectrum" so it covers both UVA and UVB rays, and make sure to reapply every 1-2 hours.  Remember to change the batteries in your smoke detectors when changing your clock times in the spring and fall.  Use your seat belt every time you are in a car, and please drive safely and not be distracted with cell phones and texting while driving.  I recommend getting the new shingles vaccine (Shingrix). You will need to check with your insurance to see if it is covered, and if covered by Medicare Part D, you need to  get from the pharmacy rather than our office.  It is a series of 2 injections, spaced 2 months apart.  Call back to either schedule nurse visit or get name on a list to be called when it is available.  Consider counseling, as we discussed.   Kegel Exercises Kegel exercises help strengthen the muscles that support the rectum, vagina, small intestine, bladder, and uterus. Doing Kegel exercises can help:  Improve bladder and bowel control.  Improve sexual response.  Reduce problems and discomfort during pregnancy.  Kegel exercises involve squeezing your pelvic floor muscles, which are the same muscles you squeeze when you try to stop the flow of urine. The exercises can be done while sitting, standing, or lying down, but it is best to vary your position. Phase 1 exercises 1. Squeeze your pelvic floor muscles tight. You should feel a tight lift in your rectal area. If you are a female, you should also feel a tightness in your vaginal area. Keep your stomach, buttocks, and legs relaxed. 2. Hold the muscles tight for up to 10 seconds. 3. Relax your muscles. Repeat this exercise 50 times a day or as many times as told by your health care provider. Continue to do this exercise for at least 4-6 weeks or for as long as told by your health care provider. This information is not intended to replace advice given to you by your health care provider.  Make sure you discuss any questions you have with your health care provider. Document Released: 04/08/2012 Document Revised: 12/16/2015 Document Reviewed: 03/12/2015 Elsevier Interactive Patient Education  [employer].

## 2017-07-18 ENCOUNTER — Other Ambulatory Visit: Payer: BC Managed Care – PPO

## 2017-07-25 ENCOUNTER — Ambulatory Visit: Payer: BC Managed Care – PPO | Admitting: Endocrinology

## 2017-07-29 ENCOUNTER — Other Ambulatory Visit (INDEPENDENT_AMBULATORY_CARE_PROVIDER_SITE_OTHER): Payer: BC Managed Care – PPO

## 2017-07-29 DIAGNOSIS — E890 Postprocedural hypothyroidism: Secondary | ICD-10-CM | POA: Diagnosis not present

## 2017-07-29 DIAGNOSIS — M810 Age-related osteoporosis without current pathological fracture: Secondary | ICD-10-CM

## 2017-07-29 LAB — VITAMIN D 25 HYDROXY (VIT D DEFICIENCY, FRACTURES): VITD: 58.2 ng/mL (ref 30.00–100.00)

## 2017-07-29 LAB — COMPREHENSIVE METABOLIC PANEL
ALBUMIN: 4.1 g/dL (ref 3.5–5.2)
ALK PHOS: 64 U/L (ref 39–117)
ALT: 15 U/L (ref 0–35)
AST: 20 U/L (ref 0–37)
BUN: 16 mg/dL (ref 6–23)
CO2: 28 mEq/L (ref 19–32)
Calcium: 9 mg/dL (ref 8.4–10.5)
Chloride: 101 mEq/L (ref 96–112)
Creatinine, Ser: 0.59 mg/dL (ref 0.40–1.20)
GFR: 109.15 mL/min (ref >60.00)
Glucose, Bld: 98 mg/dL (ref 70–99)
POTASSIUM: 4.2 meq/L (ref 3.5–5.1)
Sodium: 136 mEq/L (ref 135–145)
TOTAL PROTEIN: 7.2 g/dL (ref 6.0–8.3)
Total Bilirubin: 0.4 mg/dL (ref 0.2–1.2)

## 2017-07-29 LAB — T4, FREE: Free T4: 1.14 ng/dL (ref 0.60–1.60)

## 2017-07-29 LAB — TSH: TSH: 1.22 u[IU]/mL (ref 0.35–4.50)

## 2017-08-01 ENCOUNTER — Ambulatory Visit: Payer: BC Managed Care – PPO | Admitting: Endocrinology

## 2017-08-01 ENCOUNTER — Encounter: Payer: Self-pay | Admitting: Endocrinology

## 2017-08-01 VITALS — BP 130/80 | HR 71 | Ht 62.5 in | Wt 127.0 lb

## 2017-08-01 DIAGNOSIS — M810 Age-related osteoporosis without current pathological fracture: Secondary | ICD-10-CM | POA: Diagnosis not present

## 2017-08-01 DIAGNOSIS — E890 Postprocedural hypothyroidism: Secondary | ICD-10-CM

## 2017-08-01 MED ORDER — SYNTHROID 100 MCG PO TABS: ORAL_TABLET | ORAL | 2 refills | Status: DC

## 2017-08-01 MED FILL — SYNTHROID 100 MCG TABLET: 100 | 30 days supply | Qty: 30 | Fill #0

## 2017-08-01 NOTE — Progress Notes (Signed)
Patient ID: Theresa Schneider, female   DOB: 12/18/1955, 62 y.o.   MRN: [id number]    Reason for Appointment: Follow-up of thyroid and of osteoporosis   History of Present Illness:   Osteoporosis history: She has previously been evaluated and treated by her gynecologist since about 2004. She was given a trial of Actonel about 10 years ago. She  had difficulty tolerating this because of abdominal discomfort and not clear if she took this regularly, probably not over 2 years  She was subsequently given Prolia starting in about 2012 and she got only 3 injections. The injection was stopped because of lack of insurance coverage.  She got Reclast in 10/2013 since her bone density done by her gynecologist on 09/21/13 showed a reduced T score of -3.5 at the lumbar spine.  Subsequently she has had Reclast in 04/2015    Repeat bone density was done in 12/17 indicated the lumbar spine T-score of -2.4. There has been a statistically significant increase in BMD of Lumbar spine and left hip since prior exam dated 09/21/2013.    VITAMIN D.: She has had therapeutic levels and is taking calcium and vitamin D together, usually once or twice a day Also on vitamin D3, 1000 U daily   Lab Results  Component Value Date   VD25OH 58.20 07/29/2017    HYPOTHYROIDISM: Discussed in review of symptoms   Allergies as of 08/01/2017      Reactions   Codeine Phosphate Nausea And Vomiting   Latex Itching   senitivity      Medication List        Accurate as of 08/01/17  3:12 PM. Always use your most recent med list.          Azelastine HCl 0.15 % Soln   CALCIUM 500 PO Take 1 capsule by mouth 2 (two) times daily.   cholecalciferol 1000 units tablet Commonly known as:  VITAMIN D Take 1,000 Units by mouth daily.   fluticasone 50 MCG/ACT nasal spray Commonly known as:  FLONASE Place 1 spray into both nostrils daily.   Magnesium 500 MG Caps Take 1 capsule by mouth daily.   omeprazole 40 MG  capsule Commonly known as:  PRILOSEC TAKE ONE CAPSULE BY MOUTH DAILY BEFORE DINNER   PREMARIN vaginal cream Generic drug:  conjugated estrogens   PRESCRIPTION MEDICATION Inject 1 application into the muscle every 7 (seven) days. Allergy shots weekly   SYNTHROID 100 MCG tablet Generic drug:  levothyroxine TAKE 1 TABLET BY MOUTH DAILY BEFORE BREAKFAST.   zoledronic acid 5 MG/100ML Soln injection Commonly known as:  RECLAST Inject 100 mLs (5 mg total) into the vein once.       Past Medical History:  Diagnosis Date  . Allergy    SEASONAL/PERENNIAL  . Anxiety   . Asthma    allergy related  . Depression   . GAD (generalized anxiety disorder)   . Gastritis 2013   from NSAID's after foot surgery  . Hallux rigidus   . Hyperthyroidism     hx graves disease. dr Demetrios Isaacs pcp     . Hypothyroidism    (since I-131 treatment)  . Osteoporosis 2009  . Tendinitis of right ankle 2007    Past Surgical History:  Procedure Laterality Date  . BREAST EXCISIONAL BIOPSY Right 1994   benign   . BREAST SURGERY     fibroadenoma, right  . hallux rigidus  11/06/2011  . ROTATOR CUFF REPAIR     right  .  TONSILLECTOMY      Family History  Problem Relation Age of Onset  . Arthritis Mother        OSTEO  . Hypothyroidism Mother   . Ulcers Mother        in esophagus  . Heart attack Father 86  . Colon polyps Father        beign  . Heart disease Father        MI at 28; stents and pacemaker 59; CHF  . Hypothyroidism Father   . Diabetes Brother        obese; pre-diabetic  . Heart disease Brother 75       MI  . Hypothyroidism Sister   . Colon polyps Sister   . Depression Sister   . Hypothyroidism Sister   . Deep vein thrombosis Brother   . Colon cancer Paternal Grandfather 70  . Cancer Maternal Grandfather        esophageal  . Esophageal cancer Maternal Grandfather 75  . Breast cancer Paternal Aunt   . Colon polyps Paternal Aunt   . COPD Paternal 51   . Osteoporosis Paternal Aunt    . Osteoporosis Cousin   . Osteoporosis Paternal Grandmother   . Cancer Maternal Grandmother        leukemia  . Deep vein thrombosis Other   . Rectal cancer Neg Hx   . Stomach cancer Neg Hx     Social History:  reports that she has never smoked. She has never used smokeless tobacco. She reports that she drinks alcohol. She reports that she does not use drugs.  Allergies:  Allergies  Allergen Reactions  . Codeine Phosphate Nausea And Vomiting  . Latex Itching    senitivity   ROS   She has had some weight change:  Wt Readings from Last 3 Encounters:  08/01/17 127 lb (57.6 kg)  07/14/17 126 lb (57.2 kg)  03/20/17 128 lb 6.4 oz (58.2 kg)    She has hypothyroidism which was first diagnosed after treatment for Graves' disease several years ago  Does take the brand-name Synthroid consistently  Since she had a low TSH in 3/18 her dose was reduced from 112 down to 100 mcg She prefers to take the brand name Synthroid although it is causing her $30 per month She does not think she has any unusual fatigue, cold intolerance, weight change or hair loss She is taking her Synthroid very consistently in the morning before breakfast with water  Her TSH is again quite normal compared to 5/18                 Lab Results  Component Value Date   TSH 1.22 07/29/2017   TSH 0.73 09/16/2016   TSH 0.20 (L) 07/23/2016   FREET4 1.14 07/29/2017   FREET4 1.20 09/16/2016   FREET4 1.18 09/28/2015      Examination:   BP 130/80 (BP Location: Left Arm, Patient Position: Sitting, Cuff Size: Normal)   Pulse 71   Ht 5\' 3"  (1.6 m)   Wt 127 lb (57.6 kg)   LMP 01/04/2005   SpO2 98%   BMI 22.50 kg/m   Reflexes normal: No tremor     Assessments   Osteoporosis: She is asymptomatic with baseline T score of -3.5 along with a family history of osteoporosis  She has had Reclast 3 times and will need to be scheduled for again another injection  In 2017 bone density had shown an improvement in  her T score, with the  result of -2.4  Vitamin D level is therapeutic Continue calcium supplements and vitamin D meanwhile  Hypothyroidism, post ablative with previously stable TSH on current regimen of 100 mcg brand name Synthroid  She will be followed annually She wants to try the Synthroid direct program and will send her a 90-day prescription for this  Elayne Snare 08/01/2017, 3:12 PM

## 2017-08-04 ENCOUNTER — Telehealth: Payer: Self-pay | Admitting: Nutrition

## 2017-08-05 ENCOUNTER — Telehealth: Payer: Self-pay | Admitting: Family Medicine

## 2017-08-05 ENCOUNTER — Ambulatory Visit (INDEPENDENT_AMBULATORY_CARE_PROVIDER_SITE_OTHER): Payer: BC Managed Care – PPO | Admitting: Endocrinology

## 2017-08-05 DIAGNOSIS — M810 Age-related osteoporosis without current pathological fracture: Secondary | ICD-10-CM

## 2017-08-05 NOTE — Telephone Encounter (Signed)
Please see above/below

## 2017-08-05 NOTE — Telephone Encounter (Signed)
The best thing is good handwashing, cleaning of household (door knobs, sinks, etc), as I'm sure she is aware.  She did have her shot--her daughter and 64yo grandchild may have also had their shots, as we know that the current flu shot was only in the 40% range effective.  If she is concerned, you can offer her tamiflu for prevention (doesn't need to start until she gets there).  It would be one pill once daily for a week (for prevention, #7). If it causes her side effects (nausea is the common one), she can elect to stop it. Tell her I hope everyone is on the mend soon!

## 2017-08-05 NOTE — Telephone Encounter (Signed)
Please advise 

## 2017-08-05 NOTE — Telephone Encounter (Signed)
New Message  Pt verbalized daughter, 64 yr old and 10 month old baby has the flu. They live in [place] and she is thinking about going to try to help her and kids but also does not want to get the flu but wants some advice on what she should do.  Please f/u

## 2017-08-05 NOTE — Telephone Encounter (Signed)
Pt was notified. Pt will call back if she goes down there and helps her daughter out and will call back to get the rx for this

## 2017-08-06 MED FILL — OMEPRAZOLE DR 40 MG CAPSULE: 40 | 30 days supply | Qty: 30 | Fill #2

## 2017-08-06 NOTE — Progress Notes (Signed)
Per Dr.Kumar's note on 08/01/17,and after patient signed to consent form, an IV was started in the patientsrightarm with a 20g needle. Normal saline was infused to determine that the IV was patent, and then 5mg  Reclast was started at 11:30 AM, and infused until 12:00 PM. She reported no symptoms of discomfort during this process. We discussed the need to take Calcium and Vit. D as directed by Dr.Kumar, and she was encourage to drink at least 6 8 oz glasses of water today. She agreed to do this. The IV was then flushed with more normal saline and then D/Ced. The site showed no signs of redness or swelling.        Above note has been reviewed and approved   Elayne Snare

## 2017-08-06 NOTE — Telephone Encounter (Signed)
Opened in error

## 2017-08-06 NOTE — Patient Instructions (Signed)
Please drink 8  8 ounce glasses of water today. Continue to take your calcium and Vit. D as directed by Dr. Dwyane Dee

## 2017-08-11 ENCOUNTER — Encounter: Payer: Self-pay | Admitting: Endocrinology

## 2017-09-01 ENCOUNTER — Encounter: Payer: Self-pay | Admitting: Family Medicine

## 2017-09-01 ENCOUNTER — Ambulatory Visit: Payer: BC Managed Care – PPO | Admitting: Family Medicine

## 2017-09-01 VITALS — BP 128/78 | HR 88 | Temp 98.5°F | Ht 62.5 in | Wt 127.2 lb

## 2017-09-01 DIAGNOSIS — J069 Acute upper respiratory infection, unspecified: Secondary | ICD-10-CM

## 2017-09-01 NOTE — Progress Notes (Signed)
Chief Complaint  Patient presents with  . Cough    started sometime last week. Exposed to grandkids the week before Easter. Head is stopped up. No drippy nose any longer. Rib on right side hurts.    Exposed to sick grandchildren 2 weeks ago.  Had a cough last week. She has been using an OTC elderberry/honey liquid.  2 nights ago she got Robitussin chest and cough (daytime and nighttime).  Drippy nose resolved.  Still has some ear pressure.  She is now complaining of a "bad cough".  It isn't often, but is "bad" when she does cough.  It "sounds bad".  Having some discomfort in the right lower ribs when she coughs.  Cough is productive of small amounts of greenish phlegm, not with each coughing spell, not a lot.  She feels just a little wheezy, intermittently, when has a coughing spell.  She has an inhaler from when she was recently sick, tried it, not sure if it helped.  Got it from Dr. Donneta Romberg in February, when sick with bronchitis.  She takes the robitussin about 3x/day. Takes it at bedtime, but is up 4-6 hours later, wakes up coughing.  Allergies are "bad" now, didn't have her allergy shot last week due to illness.  PMH, PSH, SH reviewed  Outpatient Encounter Medications as of 09/01/2017  Medication Sig Note  . Azelastine HCl 0.15 % SOLN    . Calcium-Magnesium-Vitamin D (CALCIUM 500 PO) Take 1 capsule by mouth 2 (two) times daily.    . cholecalciferol (VITAMIN D) 1000 units tablet Take 1,000 Units by mouth daily.   Marland Kitchen Dextromethorphan-guaiFENesin (ROBITUSSIN COLD COUGH+ CHEST PO) Take 20 mLs by mouth as needed.   . fluticasone (FLONASE) 50 MCG/ACT nasal spray Place 1 spray into both nostrils daily.    . Magnesium 500 MG CAPS Take 1 capsule by mouth daily.   Marland Kitchen PREMARIN vaginal cream  07/14/2017: Using sporadically (supposed to be 1-2x/week)  . PRESCRIPTION MEDICATION Inject 1 application into the muscle every 7 (seven) days. Allergy shots weekly   . SYNTHROID 100 MCG tablet TAKE 1 TABLET BY  MOUTH DAILY BEFORE BREAKFAST.   Marland Kitchen zoledronic acid (RECLAST) 5 MG/100ML SOLN injection Inject 100 mLs (5 mg total) into the vein once. 07/14/2017: Gets once yearly through Dr. Dwyane Dee (due again 07/2017)  . omeprazole (PRILOSEC) 40 MG capsule TAKE ONE CAPSULE BY MOUTH DAILY BEFORE DINNER (Patient not taking: Reported on 09/01/2017) 07/14/2017: Uses prn only, prior to foods which trigger reflux   No facility-administered encounter medications on file as of 09/01/2017.    Allergies  Allergen Reactions  . Codeine Phosphate Nausea And Vomiting  . Latex Itching    senitivity   ROS: No fever, chills, nausea, vomiting, diarrhea.  Slightly wheezy intermittently, per HPI.  Slight headache when illness started 1.5 weeks ago.   PHYSICAL EXAM:  BP 128/78   Pulse 88   Temp 98.5 F (36.9 C) (Tympanic)   Ht 5' 2.5" (1.588 m)   Wt 127 lb 3.2 oz (57.7 kg)   LMP 01/04/2005   BMI 22.89 kg/m   Well appearing female, with only rare cough, though it is intense and causes her discomfort at R lower ribs with coughing only. HEENT: PERRL, EOMI, conjunctiva and sclera rae clear.  Nasal mucosa is severely edematous, pale on the left, mild-mod on the right. No erythema or purulence. OP is clear. TM's and EAC's are normal (slightly retracted). Sinuse are nontender Neck: no lymphadenopathy or mass Heart: regular rate and  rhythm Lungs clear bilaterally, no wheezes, rales, ronchi Extremities: no edema Skin: normal turgor, no rash Psych: somewhat flat affect. Stressed about husband (ill, will be having surgery next week) Neuro: alert and oriented, cranial nerves intact, normal gait, strength  ASSESSMENT/PLAN:   Viral upper respiratory tract infection - supportive measures reviewed. s/sx bacterial infection reviewed. Pt also with allergies, to resume shots   Drink plenty of water. I recommend changing to 12 hour medications so they aren't wearing off in the middle of the night. We discussed: 12 hour mucinex  (plain vs DM) Delsym syrup (if getting the plain Mucinex 12 hour, NOT the DM) Sudafed as needed for congestion You may use warm compresses and tylenol and/or ibuprofen as needed for the rib pain. If you have increasing cough, shortness of breath, pain or fever, return for recheck.  Call for tessalon (benzonatate) if this doesn't seem to help much with your cough.  This is a cough medication that can be tolerated during the day.

## 2017-09-01 NOTE — Patient Instructions (Addendum)
  Drink plenty of water. I recommend changing to 12 hour medications so they aren't wearing off in the middle of the night. We discussed: 12 hour mucinex (plain vs DM) Delsym syrup (if getting the plain Mucinex 12 hour, NOT the DM) Sudafed as needed for congestion You may use warm compresses and tylenol and/or ibuprofen as needed for the rib pain. If you have increasing cough, shortness of breath, pain or fever, return for recheck.   Call for tessalon (benzonatate) if this doesn't seem to help much with your cough.  This is a cough medication that can be tolerated during the day.

## 2017-09-02 ENCOUNTER — Other Ambulatory Visit: Payer: Self-pay | Admitting: Family Medicine

## 2017-09-02 DIAGNOSIS — Z1231 Encounter for screening mammogram for malignant neoplasm of breast: Secondary | ICD-10-CM

## 2017-09-04 MED FILL — AZELASTINE 0.15% NASAL SPRY: 0.15 | 25 days supply | Qty: 30 | Fill #1

## 2017-09-23 ENCOUNTER — Ambulatory Visit
Admission: RE | Admit: 2017-09-23 | Discharge: 2017-09-23 | Disposition: A | Discharge status: Home / Self Care | Payer: BC Managed Care – PPO | Source: Ambulatory Visit | Attending: Family Medicine | Admitting: Family Medicine

## 2017-09-23 DIAGNOSIS — Z1231 Encounter for screening mammogram for malignant neoplasm of breast: Secondary | ICD-10-CM

## 2017-09-30 MED FILL — MONTELUKAST SOD 10 MG TAB: 10 | 30 days supply | Qty: 30 | Fill #3

## 2017-09-30 MED FILL — FLUTICASONE PROP 50 MCG SPR: 50 | 30 days supply | Qty: 16 | Fill #1

## 2017-10-07 ENCOUNTER — Emergency Department (HOSPITAL_COMMUNITY)
Admission: EM | Admit: 2017-10-07 | Discharge: 2017-10-07 | Disposition: A | Discharge status: Home / Self Care | Payer: BC Managed Care – PPO | Attending: Emergency Medicine | Admitting: Emergency Medicine

## 2017-10-07 ENCOUNTER — Emergency Department (HOSPITAL_COMMUNITY): Payer: BC Managed Care – PPO

## 2017-10-07 ENCOUNTER — Encounter (HOSPITAL_COMMUNITY): Payer: Self-pay | Admitting: Emergency Medicine

## 2017-10-07 ENCOUNTER — Other Ambulatory Visit: Payer: Self-pay

## 2017-10-07 ENCOUNTER — Ambulatory Visit: Payer: BC Managed Care – PPO | Admitting: Family Medicine

## 2017-10-07 DIAGNOSIS — Z91040 Latex allergy status: Secondary | ICD-10-CM | POA: Insufficient documentation

## 2017-10-07 DIAGNOSIS — J45909 Unspecified asthma, uncomplicated: Secondary | ICD-10-CM | POA: Diagnosis not present

## 2017-10-07 DIAGNOSIS — Z79899 Other long term (current) drug therapy: Secondary | ICD-10-CM | POA: Insufficient documentation

## 2017-10-07 DIAGNOSIS — S301XXA Contusion of abdominal wall, initial encounter: Secondary | ICD-10-CM | POA: Diagnosis not present

## 2017-10-07 DIAGNOSIS — Y929 Unspecified place or not applicable: Secondary | ICD-10-CM | POA: Diagnosis not present

## 2017-10-07 DIAGNOSIS — Y939 Activity, unspecified: Secondary | ICD-10-CM | POA: Insufficient documentation

## 2017-10-07 DIAGNOSIS — Y999 Unspecified external cause status: Secondary | ICD-10-CM | POA: Insufficient documentation

## 2017-10-07 DIAGNOSIS — S20219A Contusion of unspecified front wall of thorax, initial encounter: Secondary | ICD-10-CM | POA: Diagnosis not present

## 2017-10-07 DIAGNOSIS — S299XXA Unspecified injury of thorax, initial encounter: Secondary | ICD-10-CM | POA: Diagnosis present

## 2017-10-07 DIAGNOSIS — E039 Hypothyroidism, unspecified: Secondary | ICD-10-CM | POA: Insufficient documentation

## 2017-10-07 LAB — CBC WITH DIFFERENTIAL/PLATELET
Basophils Absolute: 0 10*3/uL (ref 0.0–0.1)
Basophils Relative: 0 %
Eosinophils Absolute: 0.1 10*3/uL (ref 0.0–0.7)
Eosinophils Relative: 0 %
HEMATOCRIT: 42.2 % (ref 36.0–46.0)
HEMOGLOBIN: 13.7 g/dL (ref 12.0–15.0)
LYMPHS ABS: 2.2 10*3/uL (ref 0.7–4.0)
LYMPHS PCT: 10 %
MCH: 29.2 pg (ref 26.0–34.0)
MCHC: 32.5 g/dL (ref 30.0–36.0)
MCV: 90 fL (ref 78.0–100.0)
MONOS PCT: 8 %
Monocytes Absolute: 1.7 10*3/uL — ABNORMAL HIGH (ref 0.1–1.0)
NEUTROS ABS: 16.9 10*3/uL — AB (ref 1.7–7.7)
NEUTROS PCT: 82 %
Platelets: 422 10*3/uL — ABNORMAL HIGH (ref 150–400)
RBC: 4.69 MIL/uL (ref 3.87–5.11)
RDW: 13.5 % (ref 11.5–15.5)
WBC: 20.9 10*3/uL — AB (ref 4.0–10.5)

## 2017-10-07 LAB — COMPREHENSIVE METABOLIC PANEL
ALK PHOS: 88 U/L (ref 38–126)
ALT: 46 U/L (ref 14–54)
ANION GAP: 10 (ref 5–15)
AST: 49 U/L — ABNORMAL HIGH (ref 15–41)
Albumin: 4.7 g/dL (ref 3.5–5.0)
BILIRUBIN TOTAL: 0.6 mg/dL (ref 0.3–1.2)
BUN: 18 mg/dL (ref 6–20)
CALCIUM: 9.8 mg/dL (ref 8.9–10.3)
CO2: 27 mmol/L (ref 22–32)
Chloride: 106 mmol/L (ref 101–111)
Creatinine, Ser: 0.69 mg/dL (ref 0.44–1.00)
GFR calc non Af Amer: >60 mL/min (ref >60)
GLUCOSE: 107 mg/dL — AB (ref 65–99)
Potassium: 4.5 mmol/L (ref 3.5–5.1)
Sodium: 143 mmol/L (ref 135–145)
TOTAL PROTEIN: 8.2 g/dL — AB (ref 6.5–8.1)

## 2017-10-07 MED ORDER — DIAZEPAM 2 MG PO TABS
2.0000 mg | ORAL_TABLET | Freq: Once | ORAL | Status: AC
  Administered 2017-10-07: 2 mg via ORAL
  Filled 2017-10-07: qty 1

## 2017-10-07 MED ORDER — ONDANSETRON HCL 4 MG/2ML IJ SOLN
4.0000 mg | Freq: Once | INTRAMUSCULAR | Status: AC
  Administered 2017-10-07: 4 mg via INTRAVENOUS
  Filled 2017-10-07: qty 2

## 2017-10-07 MED ORDER — DIAZEPAM 2 MG PO TABS: 2.0000 mg | ORAL_TABLET | Freq: Four times a day (QID) | ORAL | 0 refills | Status: DC | PRN

## 2017-10-07 MED ORDER — HYDROCODONE-ACETAMINOPHEN 5-325 MG PO TABS: 1.0000 | ORAL_TABLET | Freq: Four times a day (QID) | ORAL | 0 refills | Status: DC | PRN

## 2017-10-07 MED ORDER — FENTANYL CITRATE (PF) 100 MCG/2ML IJ SOLN
25.0000 ug | Freq: Once | INTRAMUSCULAR | Status: AC
  Administered 2017-10-07: 25 ug via INTRAVENOUS
  Filled 2017-10-07: qty 2

## 2017-10-07 MED ORDER — IOPAMIDOL (ISOVUE-300) INJECTION 61%
100.0000 mL | Freq: Once | INTRAVENOUS | Status: AC | PRN
  Administered 2017-10-07: 100 mL via INTRAVENOUS

## 2017-10-07 MED ORDER — HYDROCODONE-ACETAMINOPHEN 5-325 MG PO TABS
1.0000 | ORAL_TABLET | Freq: Once | ORAL | Status: AC
  Administered 2017-10-07: 1 via ORAL
  Filled 2017-10-07: qty 1

## 2017-10-07 MED ORDER — IOPAMIDOL (ISOVUE-300) INJECTION 61%
INTRAVENOUS | Status: AC
  Filled 2017-10-07: qty 100

## 2017-10-07 MED ORDER — MELOXICAM 15 MG PO TABS: 15.0000 mg | ORAL_TABLET | Freq: Every day | ORAL | 0 refills | Status: DC

## 2017-10-07 NOTE — Discharge Instructions (Addendum)
Take valium for spams. Norco for severe pain. Mobic for pain and inflammation. Follow up with primary care doctor as needed.
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
## 2017-10-14 ENCOUNTER — Encounter: Payer: Self-pay | Admitting: Family Medicine

## 2017-11-26 ENCOUNTER — Other Ambulatory Visit: Payer: Self-pay | Admitting: Family Medicine

## 2017-11-26 DIAGNOSIS — K219 Gastro-esophageal reflux disease without esophagitis: Secondary | ICD-10-CM

## 2017-11-26 MED FILL — OMEPRAZOLE 40 MG CPDR: 40 | 30 days supply | Qty: 30 | Fill #0
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
## 2017-12-25 ENCOUNTER — Encounter: Payer: Self-pay | Admitting: Family Medicine

## 2017-12-25 ENCOUNTER — Ambulatory Visit: Payer: BC Managed Care – PPO | Admitting: Family Medicine

## 2017-12-25 VITALS — BP 140/90 | HR 72 | Ht 62.5 in | Wt 130.0 lb

## 2017-12-25 DIAGNOSIS — R030 Elevated blood-pressure reading, without diagnosis of hypertension: Secondary | ICD-10-CM | POA: Diagnosis not present

## 2017-12-25 DIAGNOSIS — H9319 Tinnitus, unspecified ear: Secondary | ICD-10-CM | POA: Diagnosis not present

## 2017-12-25 DIAGNOSIS — Z6379 Other stressful life events affecting family and household: Secondary | ICD-10-CM

## 2017-12-25 NOTE — Patient Instructions (Addendum)
Keep track of your blood pressure on the sheet provided. Be sure to keep your arm elevated at heart level.   Bring your monitor with you either to your next visit, or sooner for a nurse visit, to have the accuracy assessed. Try and follow a low sodium diet (see below). Try and get regular exercise (30 mins/d which can be in 10-15 minutes intervals). Consider relaxation techniques (yoga, meditation). Poor sleep can effect blood pressure, so try and get good sleep--consider ear plugs or mask  I think anxiety is contributing to your blood pressure. Continue to see your therapist and work on relaxation techniques, consider meditation, mindfulness, etc.  Consider seeking treatment for your bursitis if this limits your ability to exercise. For now, start with taking pain medication (tylenol, aleve or motrin) prior to walking. Icing after exercise may also help.  Ringing in the ears can be a sign of hearing loss.  I recommend getting a hearing evaluation to ensure that it is simply some age-related hearing loss (usually higher frequency) vs another more serious cause. White noise can be helpful to distract from the ringing--usually most noticeable when quiet--ie going to bed.    Low-Sodium Eating Plan Sodium, which is an element that makes up salt, helps you maintain a healthy balance of fluids in your body. Too much sodium can increase your blood pressure and cause fluid and waste to be held in your body. Your health care provider or dietitian may recommend following this plan if you have high blood pressure (hypertension), kidney disease, liver disease, or heart failure. Eating less sodium can help lower your blood pressure, reduce swelling, and protect your heart, liver, and kidneys. What are tips for following this plan? General guidelines  Most people on this plan should limit their sodium intake to 1,500-2,000 mg (milligrams) of sodium each day. Reading food labels  The Nutrition Facts  label lists the amount of sodium in one serving of the food. If you eat more than one serving, you must multiply the listed amount of sodium by the number of servings.  Choose foods with less than 140 mg of sodium per serving.  Avoid foods with 300 mg of sodium or more per serving. Shopping  Look for lower-sodium products, often labeled as "low-sodium" or "no salt added."  Always check the sodium content even if foods are labeled as "unsalted" or "no salt added".  Buy fresh foods. ? Avoid canned foods and premade or frozen meals. ? Avoid canned, cured, or processed meats  Buy breads that have less than 80 mg of sodium per slice. Cooking  Eat more home-cooked food and less restaurant, buffet, and fast food.  Avoid adding salt when cooking. Use salt-free seasonings or herbs instead of table salt or sea salt. Check with your health care provider or pharmacist before using salt substitutes.  Cook with plant-based oils, such as canola, sunflower, or olive oil. Meal planning  When eating at a restaurant, ask that your food be prepared with less salt or no salt, if possible.  Avoid foods that contain MSG (monosodium glutamate). MSG is sometimes added to [place] food, bouillon, and some canned foods. What foods are recommended? The items listed may not be a complete list. Talk with your dietitian about what dietary choices are best for you. Grains Low-sodium cereals, including oats, puffed wheat and rice, and shredded wheat. Low-sodium crackers. Unsalted rice. Unsalted pasta. Low-sodium bread. Whole-grain breads and whole-grain pasta. Vegetables Fresh or frozen vegetables. "No salt added" canned vegetables. "  No salt added" tomato sauce and paste. Low-sodium or reduced-sodium tomato and vegetable juice. Fruits Fresh, frozen, or canned fruit. Fruit juice. Meats and other protein foods Fresh or frozen (no salt added) meat, poultry, seafood, and fish. Low-sodium canned tuna and salmon.  Unsalted nuts. Dried peas, beans, and lentils without added salt. Unsalted canned beans. Eggs. Unsalted nut butters. Dairy Milk. Soy milk. Cheese that is naturally low in sodium, such as ricotta cheese, fresh mozzarella, or Swiss cheese Low-sodium or reduced-sodium cheese. Cream cheese. Yogurt. Fats and oils Unsalted butter. Unsalted margarine with no trans fat. Vegetable oils such as canola or olive oils. Seasonings and other foods Fresh and dried herbs and spices. Salt-free seasonings. Low-sodium mustard and ketchup. Sodium-free salad dressing. Sodium-free light mayonnaise. Fresh or refrigerated horseradish. Lemon juice. Vinegar. Homemade, reduced-sodium, or low-sodium soups. Unsalted popcorn and pretzels. Low-salt or salt-free chips. What foods are not recommended? The items listed may not be a complete list. Talk with your dietitian about what dietary choices are best for you. Grains Instant hot cereals. Bread stuffing, pancake, and biscuit mixes. Croutons. Seasoned rice or pasta mixes. Noodle soup cups. Boxed or frozen macaroni and cheese. Regular salted crackers. Self-rising flour. Vegetables Sauerkraut, pickled vegetables, and relishes. Olives. [place] fries. Onion rings. Regular canned vegetables (not low-sodium or reduced-sodium). Regular canned tomato sauce and paste (not low-sodium or reduced-sodium). Regular tomato and vegetable juice (not low-sodium or reduced-sodium). Frozen vegetables in sauces. Meats and other protein foods Meat or fish that is salted, canned, smoked, spiced, or pickled. Bacon, ham, sausage, hotdogs, corned beef, chipped beef, packaged lunch meats, salt pork, jerky, pickled herring, anchovies, regular canned tuna, sardines, salted nuts. Dairy Processed cheese and cheese spreads. Cheese curds. Blue cheese. Feta cheese. String cheese. Regular cottage cheese. Buttermilk. Canned milk. Fats and oils Salted butter. Regular margarine. Ghee. Bacon fat. Seasonings and other  foods Onion salt, garlic salt, seasoned salt, table salt, and sea salt. Canned and packaged gravies. Worcestershire sauce. Tartar sauce. Barbecue sauce. Teriyaki sauce. Soy sauce, including reduced-sodium. Steak sauce. Fish sauce. Oyster sauce. Cocktail sauce. Horseradish that you find on the shelf. Regular ketchup and mustard. Meat flavorings and tenderizers. Bouillon cubes. Hot sauce and Tabasco sauce. Premade or packaged marinades. Premade or packaged taco seasonings. Relishes. Regular salad dressings. Salsa. Potato and tortilla chips. Corn chips and puffs. Salted popcorn and pretzels. Canned or dried soups. Pizza. Frozen entrees and pot pies. Summary  Eating less sodium can help lower your blood pressure, reduce swelling, and protect your heart, liver, and kidneys.  Most people on this plan should limit their sodium intake to 1,500-2,000 mg (milligrams) of sodium each day.  Canned, boxed, and frozen foods are high in sodium. Restaurant foods, fast foods, and pizza are also very high in sodium. You also get sodium by adding salt to food.  Try to cook at home, eat more fresh fruits and vegetables, and eat less fast food, canned, processed, or prepared foods. This information is not intended to replace advice given to you by your health care provider. Make sure you discuss any questions you have with your health care provider. Document Released: 10/12/2001 Document Revised: 04/15/2016 Document Reviewed: 04/15/2016 Elsevier Interactive Patient Education  [employer].

## 2018-01-26 MED FILL — SOLIFENACIN SUCCINATE 5 MG: 5 | 30 days supply | Qty: 30 | Fill #0

## 2018-01-26 MED FILL — OMEPRAZOLE 40 MG CPDR: 40 | 30 days supply | Qty: 30 | Fill #1

## 2018-02-05 ENCOUNTER — Encounter: Payer: Self-pay | Admitting: *Deleted
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
## 2018-02-12 ENCOUNTER — Ambulatory Visit: Payer: BC Managed Care – PPO | Admitting: Family Medicine

## 2018-02-12 ENCOUNTER — Encounter: Payer: Self-pay | Admitting: Family Medicine

## 2018-02-12 VITALS — BP 160/80 | HR 80 | Ht 62.5 in | Wt 128.6 lb

## 2018-02-12 DIAGNOSIS — R030 Elevated blood-pressure reading, without diagnosis of hypertension: Secondary | ICD-10-CM

## 2018-02-12 DIAGNOSIS — Z6379 Other stressful life events affecting family and household: Secondary | ICD-10-CM

## 2018-02-12 NOTE — Patient Instructions (Addendum)
Listen to your body and back off/rest/change activity if walking is bothering your knees.  Exercise bike and swimming are much easier on the knees. Make sure your arch support/shoes are good.  Use lubricants with intercourse (KY, or you can try coconut oil), if you feel this isn't enough, and need to use the premarin cream vaginally, you will need to get the applicator (from pharmacy), and use it intravaginally 2x/week.

## 2018-02-24 MED FILL — SOLIFENACIN SUCCINATE 5 MG: 5 | 30 days supply | Qty: 30 | Fill #1

## 2018-03-10 ENCOUNTER — Other Ambulatory Visit (INDEPENDENT_AMBULATORY_CARE_PROVIDER_SITE_OTHER): Payer: BC Managed Care – PPO

## 2018-03-10 DIAGNOSIS — Z23 Encounter for immunization: Secondary | ICD-10-CM | POA: Diagnosis not present

## 2018-03-13 ENCOUNTER — Other Ambulatory Visit: Payer: Self-pay | Admitting: Endocrinology

## 2018-03-20 MED FILL — MONTELUKAST SOD 10 MG TAB: 10 | 30 days supply | Qty: 30 | Fill #0

## 2018-03-20 MED FILL — AZELASTINE 0.15% NASAL SPRY: 0.15 | 25 days supply | Qty: 30 | Fill #0

## 2018-03-20 MED FILL — PREMARIN VAGINAL CREAM-APPL: 0.625 | 90 days supply | Qty: 30 | Fill #0

## 2018-03-20 MED FILL — FLUTICASONE PROP 50 MCG SPR: 50 | 30 days supply | Qty: 16 | Fill #0

## 2018-03-23 MED FILL — OMEPRAZOLE 40 MG CPDR: 40 | 30 days supply | Qty: 30 | Fill #2

## 2018-03-27 MED FILL — SOLIFENACIN SUCCINATE 5 MG: 5 | 30 days supply | Qty: 30 | Fill #2

## 2018-04-28 ENCOUNTER — Other Ambulatory Visit: Payer: Self-pay | Admitting: Family Medicine

## 2018-04-28 DIAGNOSIS — K219 Gastro-esophageal reflux disease without esophagitis: Secondary | ICD-10-CM

## 2018-04-28 MED FILL — MONTELUKAST SOD 10 MG TAB: 10 | 30 days supply | Qty: 30 | Fill #1

## 2018-04-28 MED FILL — SOLIFENACIN SUCCINATE 5 MG: 5 | 30 days supply | Qty: 30 | Fill #3

## 2018-04-28 MED FILL — FLUTICASONE PROP 50 MCG SPR: 50 | 30 days supply | Qty: 16 | Fill #1

## 2018-04-28 MED FILL — OMEPRAZOLE 40 MG CPDR: 40 | 30 days supply | Qty: 30 | Fill #0

## 2018-04-28 MED FILL — AZELASTINE 0.15% NASAL SPRY: 0.15 | 25 days supply | Qty: 30 | Fill #1

## 2018-06-03 ENCOUNTER — Other Ambulatory Visit: Payer: Self-pay

## 2018-06-03 ENCOUNTER — Telehealth: Payer: Self-pay | Admitting: Endocrinology

## 2018-06-03 MED ORDER — SYNTHROID 100 MCG PO TABS: ORAL_TABLET | ORAL | 0 refills | Status: DC

## 2018-06-03 NOTE — Telephone Encounter (Signed)
Rx sent to pharmacy listed below. 

## 2018-06-03 NOTE — Telephone Encounter (Signed)
[facility] in [place] called re: Need RX Refill for Synthroid 100 mcg-90 day supply-1 tablet daily before breakfast. Please call ph# [phone] or e-scribe.

## 2018-07-16 ENCOUNTER — Encounter: Payer: BC Managed Care – PPO | Admitting: Family Medicine

## 2018-07-28 ENCOUNTER — Other Ambulatory Visit: Payer: Self-pay | Admitting: Endocrinology

## 2018-07-28 DIAGNOSIS — M810 Age-related osteoporosis without current pathological fracture: Secondary | ICD-10-CM

## 2018-07-28 DIAGNOSIS — E890 Postprocedural hypothyroidism: Secondary | ICD-10-CM

## 2018-07-29 ENCOUNTER — Other Ambulatory Visit: Payer: BC Managed Care – PPO

## 2018-08-03 ENCOUNTER — Ambulatory Visit: Payer: BC Managed Care – PPO | Admitting: Endocrinology

## 2018-08-09 ENCOUNTER — Other Ambulatory Visit: Payer: Self-pay | Admitting: Endocrinology

## 2018-08-21 ENCOUNTER — Other Ambulatory Visit: Payer: Self-pay | Admitting: Family Medicine

## 2018-08-21 DIAGNOSIS — Z1231 Encounter for screening mammogram for malignant neoplasm of breast: Secondary | ICD-10-CM

## 2018-08-24 ENCOUNTER — Other Ambulatory Visit: Payer: BC Managed Care – PPO

## 2018-08-26 ENCOUNTER — Ambulatory Visit: Payer: BC Managed Care – PPO | Admitting: Endocrinology

## 2018-09-11 MED FILL — FLUTICASONE PROP 50 MCG SPR: 50 | 30 days supply | Qty: 16 | Fill #2

## 2018-09-11 MED FILL — EPINEPHRINE 0.3 MG AUTO-INJ: 0.3 | 2 days supply | Qty: 2 | Fill #0

## 2018-09-11 MED FILL — MONTELUKAST SOD 10 MG TAB: 10 | 30 days supply | Qty: 30 | Fill #2

## 2018-09-11 MED FILL — PREMARIN VAGINAL CREAM-APPL: 0.625 | 90 days supply | Qty: 30 | Fill #1

## 2018-09-11 MED FILL — SOLIFENACIN SUCCINATE 5 MG: 5 | 30 days supply | Qty: 30 | Fill #4

## 2018-09-11 MED FILL — AZELASTINE 0.15% NASAL SPRY: 0.15 | 25 days supply | Qty: 30 | Fill #2

## 2018-09-11 MED FILL — OMEPRAZOLE 40 MG CPDR: 40 | 30 days supply | Qty: 30 | Fill #1

## 2018-09-14 ENCOUNTER — Encounter: Payer: BC Managed Care – PPO | Admitting: Family Medicine

## 2018-09-25 DIAGNOSIS — J3081 Allergic rhinitis due to animal (cat) (dog) hair and dander: Secondary | ICD-10-CM | POA: Diagnosis not present

## 2018-09-25 DIAGNOSIS — J3089 Other allergic rhinitis: Secondary | ICD-10-CM | POA: Diagnosis not present

## 2018-10-05 DIAGNOSIS — N952 Postmenopausal atrophic vaginitis: Secondary | ICD-10-CM | POA: Diagnosis not present

## 2018-10-05 DIAGNOSIS — R350 Frequency of micturition: Secondary | ICD-10-CM | POA: Diagnosis not present

## 2018-10-05 DIAGNOSIS — J3089 Other allergic rhinitis: Secondary | ICD-10-CM | POA: Diagnosis not present

## 2018-10-06 ENCOUNTER — Other Ambulatory Visit: Payer: Self-pay

## 2018-10-06 ENCOUNTER — Other Ambulatory Visit (INDEPENDENT_AMBULATORY_CARE_PROVIDER_SITE_OTHER): Payer: Medicare Other

## 2018-10-06 DIAGNOSIS — M810 Age-related osteoporosis without current pathological fracture: Secondary | ICD-10-CM | POA: Diagnosis not present

## 2018-10-06 DIAGNOSIS — E890 Postprocedural hypothyroidism: Secondary | ICD-10-CM | POA: Diagnosis not present

## 2018-10-06 LAB — TSH: TSH: 1.76 u[IU]/mL (ref 0.35–4.50)

## 2018-10-06 LAB — BASIC METABOLIC PANEL
BUN: 10 mg/dL (ref 6–23)
CO2: 29 mEq/L (ref 19–32)
Calcium: 9.6 mg/dL (ref 8.4–10.5)
Chloride: 104 mEq/L (ref 96–112)
Creatinine, Ser: 0.67 mg/dL (ref 0.40–1.20)
GFR: 88.35 mL/min (ref >60.00)
Glucose, Bld: 80 mg/dL (ref 70–99)
Potassium: 3.9 mEq/L (ref 3.5–5.1)
Sodium: 140 mEq/L (ref 135–145)

## 2018-10-06 LAB — VITAMIN D 25 HYDROXY (VIT D DEFICIENCY, FRACTURES): VITD: 50.37 ng/mL (ref 30.00–100.00)

## 2018-10-06 LAB — T4, FREE: Free T4: 1.07 ng/dL (ref 0.60–1.60)

## 2018-10-08 ENCOUNTER — Ambulatory Visit (INDEPENDENT_AMBULATORY_CARE_PROVIDER_SITE_OTHER): Payer: Medicare Other | Admitting: Endocrinology

## 2018-10-08 ENCOUNTER — Encounter: Payer: Self-pay | Admitting: Endocrinology

## 2018-10-08 ENCOUNTER — Other Ambulatory Visit: Payer: Self-pay

## 2018-10-08 DIAGNOSIS — M810 Age-related osteoporosis without current pathological fracture: Secondary | ICD-10-CM | POA: Diagnosis not present

## 2018-10-08 DIAGNOSIS — H2513 Age-related nuclear cataract, bilateral: Secondary | ICD-10-CM | POA: Diagnosis not present

## 2018-10-08 DIAGNOSIS — E890 Postprocedural hypothyroidism: Secondary | ICD-10-CM

## 2018-10-08 NOTE — Progress Notes (Signed)
Patient ID: Theresa Schneider, female   DOB: [birth year], 63 y.o.   MRN: [id number]  Today's office visit was provided via telemedicine using video technique The patient was explained the limitations of evaluation and management by telemedicine and the availability of in person appointments.  The patient understood the limitations and agreed to proceed. Patient also understood that the telehealth visit is billable. . Location of the patient: Patient's home . Location of the provider: Physician office Only the patient and myself were participating in the encounter    Reason for Appointment: Follow-up of thyroid and of osteoporosis   History of Present Illness:   Osteoporosis history: She has previously been evaluated and treated by her gynecologist since about 2004. She was given a trial of Actonel about 10 years ago. She  had difficulty tolerating this because of abdominal discomfort and not clear if she took this regularly, probably not over 2 years  She was subsequently given Prolia starting in about 2012 and she got only 3 injections. The injection was stopped because of lack of insurance coverage.  She got Reclast in 10/2013 since her bone density done by her gynecologist on 09/21/13 showed a reduced T score of -3.5 at the lumbar spine.  Subsequently she has had Reclast about once a year without side effects Last injection was on 08/05/2017   Repeat bone density was done in 12/17 indicated the lumbar spine T-score of -2.4. There has been a statistically significant increase in BMD of Lumbar spine and left hip since prior exam dated 09/21/2013.    VITAMIN D.: She has had therapeutic levels She is taking calcium and vitamin D combination Also on vitamin D3, 1000 U daily   Lab Results  Component Value Date   VD25OH 50.37 10/06/2018    HYPOTHYROIDISM: Discussed in review of symptoms   Allergies as of 10/08/2018      Reactions   Codeine Phosphate Nausea And Vomiting    Latex Itching   senitivity      Medication List       Accurate as of October 08, 2018  8:45 AM. If you have any questions, ask your nurse or doctor.        Azelastine HCl 0.15 % Soln   CALCIUM 500 PO Take 1 capsule by mouth 2 (two) times daily.   cholecalciferol 1000 units tablet Commonly known as:  VITAMIN D Take 1,000 Units by mouth daily.   fluticasone 50 MCG/ACT nasal spray Commonly known as:  FLONASE Place 1 spray into both nostrils daily.   Magnesium 500 MG Caps Take 1 capsule by mouth daily.   montelukast 10 MG tablet Commonly known as:  SINGULAIR Take 10 mg by mouth daily.   omeprazole 40 MG capsule Commonly known as:  PRILOSEC TAKE ONE CAPSULE BY MOUTH DAILY BEFORE DINNER   Premarin vaginal cream Generic drug:  conjugated estrogens   PRESCRIPTION MEDICATION Inject 1 application into the muscle every 7 (seven) days. Allergy shots weekly   solifenacin 5 MG tablet Commonly known as:  VESICARE Take 5 mg by mouth daily.   Synthroid 100 MCG tablet Generic drug:  levothyroxine TAKE 1 TABLET DAILY BEFORE BREAKFAST   zoledronic acid 5 MG/100ML Soln injection Commonly known as:  RECLAST Inject 100 mLs (5 mg total) into the vein once.       Past Medical History:  Diagnosis Date  . Allergy    SEASONAL/PERENNIAL  . Anxiety   . Asthma    allergy related  .  Depression   . GAD (generalized anxiety disorder)   . Gastritis 2013   from NSAID's after foot surgery  . Hallux rigidus   . Hyperthyroidism     hx graves disease. dr Demetrios Isaacs pcp     . Hypothyroidism    (since I-131 treatment)  . Osteoporosis 2009  . Tendinitis of right ankle 2007    Past Surgical History:  Procedure Laterality Date  . BREAST EXCISIONAL BIOPSY Right 1994   benign   . BREAST SURGERY     fibroadenoma, right  . hallux rigidus  11/06/2011  . ROTATOR CUFF REPAIR     right  . TONSILLECTOMY      Family History  Problem Relation Age of Onset  . Arthritis Mother        OSTEO  .  Hypothyroidism Mother   . Ulcers Mother        in esophagus  . Heart attack Father 52  . Colon polyps Father        beign  . Heart disease Father        MI at 92; stents and pacemaker 8; CHF  . Hypothyroidism Father   . Diabetes Brother        obese; pre-diabetic  . Heart disease Brother 58       MI  . Hypothyroidism Sister   . Colon polyps Sister   . Depression Sister   . Hypothyroidism Sister   . Deep vein thrombosis Brother   . Colon cancer Paternal Grandfather 64  . Cancer Maternal Grandfather        esophageal  . Esophageal cancer Maternal Grandfather 80  . Breast cancer Paternal Aunt   . Colon polyps Paternal Aunt   . COPD Paternal 41   . Osteoporosis Paternal Aunt   . Osteoporosis Cousin   . Osteoporosis Paternal Grandmother   . Cancer Maternal Grandmother        leukemia  . Deep vein thrombosis Other   . Rectal cancer Neg Hx   . Stomach cancer Neg Hx     Social History:  reports that she has never smoked. She has never used smokeless tobacco. She reports current alcohol use. She reports that she does not use drugs.  Allergies:  Allergies  Allergen Reactions  . Codeine Phosphate Nausea And Vomiting  . Latex Itching    senitivity   ROS   She thinks her weight is about the same  Wt Readings from Last 3 Encounters:  02/12/18 128 lb 9.6 oz (58.3 kg)  12/25/17 130 lb (59 kg)  09/01/17 127 lb 3.2 oz (57.7 kg)    She has hypothyroidism which was first diagnosed after treatment for Graves' disease several years ago  Does take the brand-name Synthroid, now from the Synthroid direct program  She is here for her annual visit She says she has been feeling more tired but this is been going on for several months Not complaining of any other symptoms or have any recent weight change  She is taking her Synthroid very consistently in the morning before breakfast with water  Her TSH is again quite normal at 1.8   Lab Results  Component Value Date   TSH  1.76 10/06/2018   TSH 1.22 07/29/2017   TSH 0.73 09/16/2016   FREET4 1.07 10/06/2018   FREET4 1.14 07/29/2017   FREET4 1.20 09/16/2016      Examination:   LMP 01/04/2005       Assessments   Osteoporosis: She is asymptomatic with  baseline T score of -3.5 along with a family history of osteoporosis  She has had Reclast 4 times, with the last infusion done in 4/29 without side effects  In 2017 bone density had shown an improvement in her T score, with the result of -2.4  Vitamin D level is therapeutic Continue calcium supplements and vitamin D also  Hypothyroidism, post ablative with stable TSH on current regimen of 100 mcg brand name Synthroid  Fatigue: This is not explained by hypothyroidism and she will follow-up with PCP for evaluation  She will be followed annually She will check to see if she will now get her next prescription of Synthroid from [facility] 10/08/2018, 8:45 AM

## 2018-10-15 ENCOUNTER — Other Ambulatory Visit: Payer: Self-pay

## 2018-10-15 ENCOUNTER — Ambulatory Visit
Admission: RE | Admit: 2018-10-15 | Discharge: 2018-10-15 | Disposition: A | Discharge status: Home / Self Care | Payer: Medicare Other | Source: Ambulatory Visit | Attending: Family Medicine | Admitting: Family Medicine

## 2018-10-15 DIAGNOSIS — Z1231 Encounter for screening mammogram for malignant neoplasm of breast: Secondary | ICD-10-CM | POA: Diagnosis not present

## 2018-10-16 DIAGNOSIS — J3089 Other allergic rhinitis: Secondary | ICD-10-CM | POA: Diagnosis not present

## 2018-10-16 DIAGNOSIS — J3081 Allergic rhinitis due to animal (cat) (dog) hair and dander: Secondary | ICD-10-CM | POA: Diagnosis not present

## 2018-10-23 DIAGNOSIS — J3089 Other allergic rhinitis: Secondary | ICD-10-CM | POA: Diagnosis not present

## 2018-11-04 ENCOUNTER — Telehealth: Payer: Self-pay | Admitting: Endocrinology

## 2018-11-04 DIAGNOSIS — J3081 Allergic rhinitis due to animal (cat) (dog) hair and dander: Secondary | ICD-10-CM | POA: Diagnosis not present

## 2018-11-04 DIAGNOSIS — J3089 Other allergic rhinitis: Secondary | ICD-10-CM | POA: Diagnosis not present

## 2018-11-04 NOTE — Telephone Encounter (Signed)
Per patient at her last visit with Dr Dwyane Dee she was advised to get a bone density scan before end of the year.  She tried to schedule, but they do not currently have orders for that bone density.  Please send orders to [facility] fax [phone] if they can not be sent electronically

## 2018-11-04 NOTE — Telephone Encounter (Signed)
Has this been ordered?

## 2018-11-05 ENCOUNTER — Other Ambulatory Visit: Payer: Self-pay | Admitting: Endocrinology

## 2018-11-05 DIAGNOSIS — M810 Age-related osteoporosis without current pathological fracture: Secondary | ICD-10-CM

## 2018-11-05 NOTE — Telephone Encounter (Signed)
Will be faxed today.

## 2018-11-05 NOTE — Telephone Encounter (Signed)
It has been ordered.

## 2018-11-09 ENCOUNTER — Ambulatory Visit (INDEPENDENT_AMBULATORY_CARE_PROVIDER_SITE_OTHER): Payer: Medicare Other | Admitting: Endocrinology

## 2018-11-09 ENCOUNTER — Other Ambulatory Visit: Payer: Self-pay

## 2018-11-09 DIAGNOSIS — M818 Other osteoporosis without current pathological fracture: Secondary | ICD-10-CM

## 2018-11-10 NOTE — Patient Instructions (Signed)
Continue to take your Vit. D and Calcium per Dr. Ronnie Derby orders Drink 5-6 glasses of water today. Call if questions.

## 2018-11-10 NOTE — Progress Notes (Signed)
After patient signed the consent form, an IV of Normal Saline was started in her right arm at 10:30 AM.  Once the IV was determined to not have infiltrated, 5mg . Of  Reclast was started at 10:35 and infused until 11:05.  The patient denied discomfort or pain at the IV site,  and no swelling or reddness was noted.  After the Reclast infused, 10 ml of normal saline was infused and the IV was discontinued.  The site showed not redness,swelling, or discoloration.  She was encourage to continue her Vit. D and Calcium, per Dr. Ronnie Derby instruction.  She was also encourage to drink 5-6 glasses of water today. She agreed to do this, and had no final questions. Leonia Reader, RN, CDE

## 2018-11-11 DIAGNOSIS — J3089 Other allergic rhinitis: Secondary | ICD-10-CM | POA: Diagnosis not present

## 2018-11-11 DIAGNOSIS — J3081 Allergic rhinitis due to animal (cat) (dog) hair and dander: Secondary | ICD-10-CM | POA: Diagnosis not present

## 2018-11-16 ENCOUNTER — Other Ambulatory Visit: Payer: Self-pay | Admitting: Nutrition

## 2018-11-18 DIAGNOSIS — J3089 Other allergic rhinitis: Secondary | ICD-10-CM | POA: Diagnosis not present

## 2018-11-25 DIAGNOSIS — J3089 Other allergic rhinitis: Secondary | ICD-10-CM | POA: Diagnosis not present

## 2018-11-30 DIAGNOSIS — J3089 Other allergic rhinitis: Secondary | ICD-10-CM | POA: Diagnosis not present

## 2018-12-07 DIAGNOSIS — J3081 Allergic rhinitis due to animal (cat) (dog) hair and dander: Secondary | ICD-10-CM | POA: Diagnosis not present

## 2018-12-07 DIAGNOSIS — J3089 Other allergic rhinitis: Secondary | ICD-10-CM | POA: Diagnosis not present

## 2018-12-30 DIAGNOSIS — J3089 Other allergic rhinitis: Secondary | ICD-10-CM | POA: Diagnosis not present

## 2019-01-05 DIAGNOSIS — J3081 Allergic rhinitis due to animal (cat) (dog) hair and dander: Secondary | ICD-10-CM | POA: Diagnosis not present

## 2019-01-05 DIAGNOSIS — J3089 Other allergic rhinitis: Secondary | ICD-10-CM | POA: Diagnosis not present

## 2019-01-13 DIAGNOSIS — J3081 Allergic rhinitis due to animal (cat) (dog) hair and dander: Secondary | ICD-10-CM | POA: Diagnosis not present

## 2019-01-13 DIAGNOSIS — J3089 Other allergic rhinitis: Secondary | ICD-10-CM | POA: Diagnosis not present

## 2019-01-14 ENCOUNTER — Other Ambulatory Visit: Payer: Self-pay

## 2019-01-14 ENCOUNTER — Ambulatory Visit
Admission: RE | Admit: 2019-01-14 | Discharge: 2019-01-14 | Disposition: A | Discharge status: Home / Self Care | Payer: Medicare Other | Source: Ambulatory Visit | Attending: Endocrinology | Admitting: Endocrinology

## 2019-01-14 DIAGNOSIS — Z780 Asymptomatic menopausal state: Secondary | ICD-10-CM | POA: Diagnosis not present

## 2019-01-14 DIAGNOSIS — M810 Age-related osteoporosis without current pathological fracture: Secondary | ICD-10-CM

## 2019-01-14 DIAGNOSIS — M8589 Other specified disorders of bone density and structure, multiple sites: Secondary | ICD-10-CM | POA: Diagnosis not present

## 2019-01-20 DIAGNOSIS — J3089 Other allergic rhinitis: Secondary | ICD-10-CM | POA: Diagnosis not present

## 2019-01-20 DIAGNOSIS — J3081 Allergic rhinitis due to animal (cat) (dog) hair and dander: Secondary | ICD-10-CM | POA: Diagnosis not present

## 2019-01-20 DIAGNOSIS — J301 Allergic rhinitis due to pollen: Secondary | ICD-10-CM | POA: Diagnosis not present

## 2019-01-22 DIAGNOSIS — J4530 Mild persistent asthma, uncomplicated: Secondary | ICD-10-CM | POA: Diagnosis not present

## 2019-01-22 DIAGNOSIS — J3081 Allergic rhinitis due to animal (cat) (dog) hair and dander: Secondary | ICD-10-CM | POA: Diagnosis not present

## 2019-01-22 DIAGNOSIS — H1045 Other chronic allergic conjunctivitis: Secondary | ICD-10-CM | POA: Diagnosis not present

## 2019-01-22 DIAGNOSIS — J3089 Other allergic rhinitis: Secondary | ICD-10-CM | POA: Diagnosis not present

## 2019-01-27 DIAGNOSIS — J301 Allergic rhinitis due to pollen: Secondary | ICD-10-CM | POA: Diagnosis not present

## 2019-02-01 DIAGNOSIS — J3089 Other allergic rhinitis: Secondary | ICD-10-CM | POA: Diagnosis not present

## 2019-02-08 DIAGNOSIS — J3089 Other allergic rhinitis: Secondary | ICD-10-CM | POA: Diagnosis not present

## 2019-02-08 DIAGNOSIS — J3081 Allergic rhinitis due to animal (cat) (dog) hair and dander: Secondary | ICD-10-CM | POA: Diagnosis not present

## 2019-02-08 DIAGNOSIS — Z23 Encounter for immunization: Secondary | ICD-10-CM | POA: Diagnosis not present

## 2019-02-25 DIAGNOSIS — J3081 Allergic rhinitis due to animal (cat) (dog) hair and dander: Secondary | ICD-10-CM | POA: Diagnosis not present

## 2019-02-25 DIAGNOSIS — J3089 Other allergic rhinitis: Secondary | ICD-10-CM | POA: Diagnosis not present

## 2019-02-26 ENCOUNTER — Telehealth: Payer: Self-pay | Admitting: Endocrinology

## 2019-02-26 ENCOUNTER — Other Ambulatory Visit: Payer: Self-pay

## 2019-02-26 MED ORDER — SYNTHROID 100 MCG PO TABS: 100.0000 ug | ORAL_TABLET | Freq: Every day | ORAL | 0 refills | Status: DC

## 2019-02-26 NOTE — Telephone Encounter (Signed)
Rx has been sent  

## 2019-02-26 NOTE — Telephone Encounter (Signed)
Patient called to advised that [facility] did receive the Synthroid, but are telling the patient that Synthroid would require a PA. Patient doesn't know if she should stay on Synthroid and request a PA or if the Levothyroxine would be a good substitute.  She just needs advise on how to proceed with her medicine.  She currently has @ 2 weeks left but does not want to wait to last minute and scramble.   [facility] [phone] for PA if that is the direction Dr Dwyane Dee recommends

## 2019-02-26 NOTE — Telephone Encounter (Deleted)
Patient has switched to Medicare and needs her RX switched to [facility]  MEDICATION: Synthroid 100 MCG  PHARMACY:  [facility]  IS THIS A 90 DAY SUPPLY : yes  IS PATIENT OUT OF MEDICATION: no  IF NOT; HOW MUCH IS LEFT:   LAST APPOINTMENT DATE: @7 /10/2018  NEXT APPOINTMENT DATE:@Visit  date not found  DO WE HAVE YOUR PERMISSION TO LEAVE A DETAILED MESSAGE: yes  OTHER COMMENTS:    **Let patient know to contact pharmacy at the end of the day to make sure medication is ready. **  ** Please notify patient to allow 48-72 hours to process**  **Encourage patient to contact the pharmacy for refills or they can request refills through [facility]**

## 2019-02-26 NOTE — Telephone Encounter (Signed)
**   Patient has switched to Medicare and needs her RX switched to [facility]  MEDICATION: Synthroid 100 MCG  PHARMACY:  [facility]  IS THIS A 90 DAY SUPPLY : yes  IS PATIENT OUT OF MEDICATION: no  IF NOT; HOW MUCH IS LEFT:   LAST APPOINTMENT DATE: @7 /10/2018  NEXT APPOINTMENT DATE:@Visit  date not found  DO WE HAVE YOUR PERMISSION TO LEAVE A DETAILED MESSAGE: yes  OTHER COMMENTS:    **Let patient know to contact pharmacy at the end of the day to make sure medication is ready. **  ** Please notify patient to allow 48-72 hours to process**  **Encourage patient to contact the pharmacy for refills or they can request refills through [facility]**

## 2019-03-01 NOTE — Telephone Encounter (Signed)
Ok to change to another name brand?

## 2019-03-01 NOTE — Telephone Encounter (Signed)
MyChart message sent to pt informing her to check coverage with insurance.

## 2019-03-01 NOTE — Telephone Encounter (Signed)
She can find out if Levoxyl or Unithroid would be covered

## 2019-03-04 ENCOUNTER — Other Ambulatory Visit: Payer: Self-pay

## 2019-03-04 MED ORDER — UNITHROID 100 MCG PO TABS: 100.0000 ug | ORAL_TABLET | Freq: Every day | ORAL | 1 refills | Status: DC

## 2019-03-05 DIAGNOSIS — J3089 Other allergic rhinitis: Secondary | ICD-10-CM | POA: Diagnosis not present

## 2019-03-05 DIAGNOSIS — J3081 Allergic rhinitis due to animal (cat) (dog) hair and dander: Secondary | ICD-10-CM | POA: Diagnosis not present

## 2019-03-10 DIAGNOSIS — J3089 Other allergic rhinitis: Secondary | ICD-10-CM | POA: Diagnosis not present

## 2019-03-10 DIAGNOSIS — J3081 Allergic rhinitis due to animal (cat) (dog) hair and dander: Secondary | ICD-10-CM | POA: Diagnosis not present

## 2019-03-11 ENCOUNTER — Other Ambulatory Visit: Payer: Self-pay

## 2019-03-11 ENCOUNTER — Telehealth: Payer: Self-pay | Admitting: Endocrinology

## 2019-03-11 IMAGING — MG 2D DIGITAL SCREENING BILATERAL MAMMOGRAM WITH CAD AND ADJUNCT TO
9 of 12 series · 9 of 28 positions shown · non-contrast
Comparison: Previous exam(s).

CLINICAL DATA: Screening.

EXAM:
2D DIGITAL SCREENING BILATERAL MAMMOGRAM WITH CAD AND ADJUNCT TOMO

[L MLO]
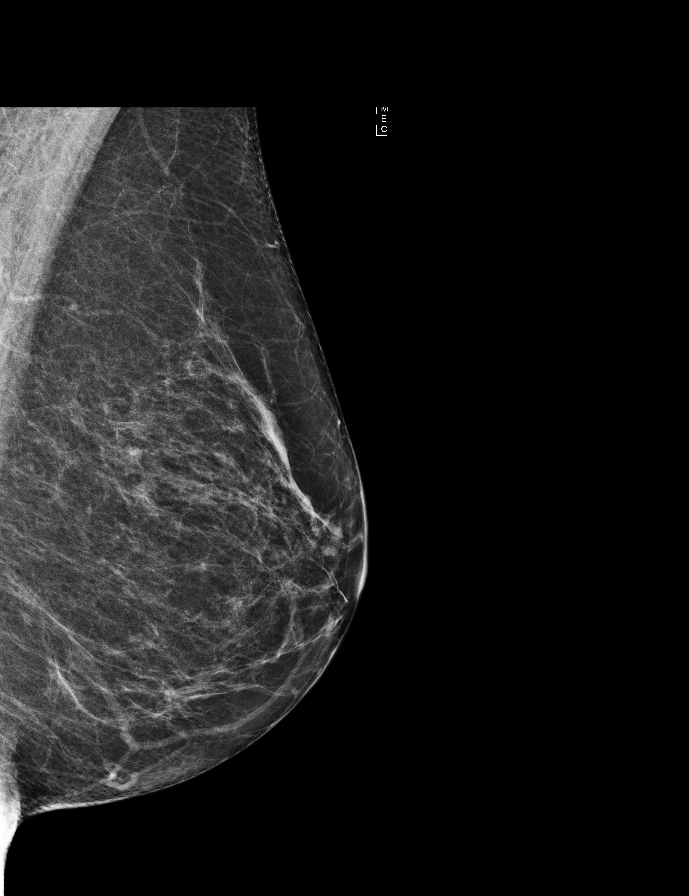

[R CC]
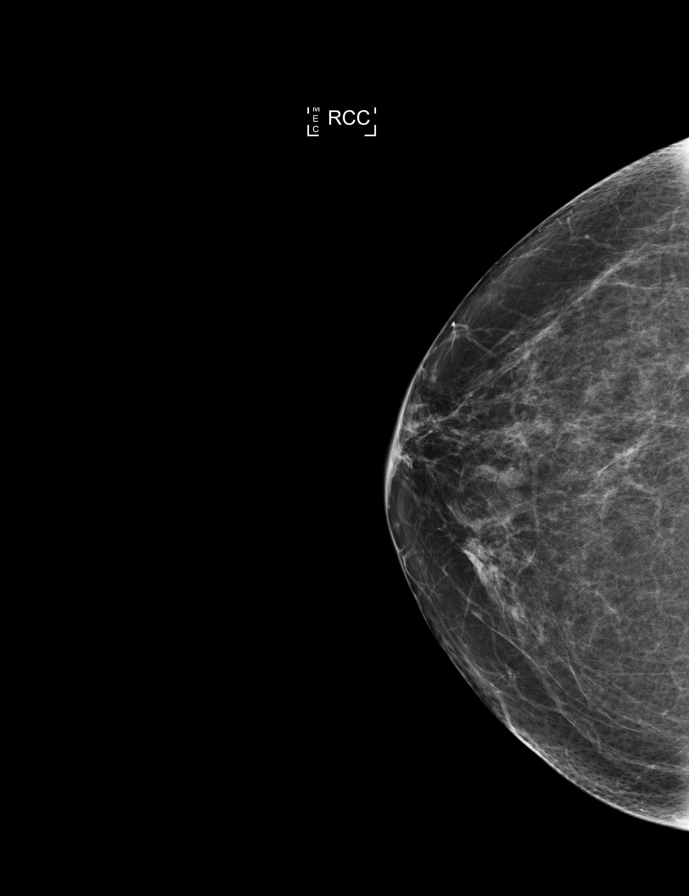

[R MLO]
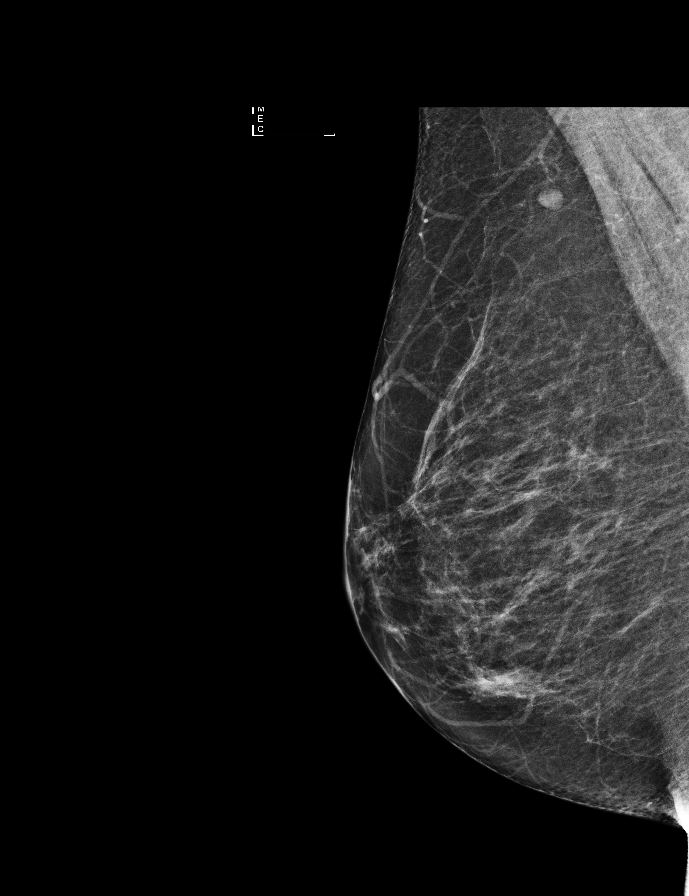

[L MLO synth-2D]
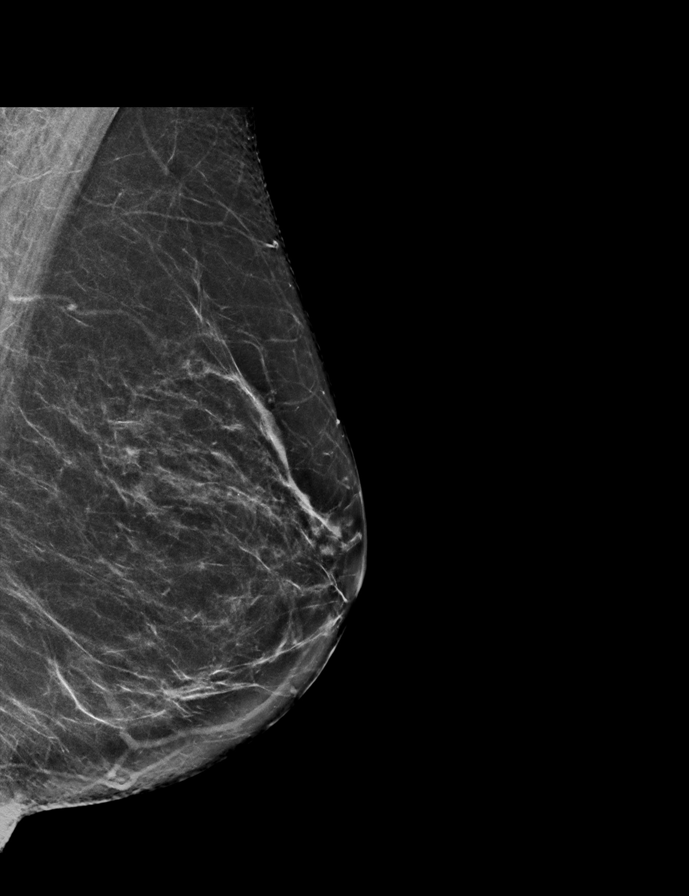

[L CC synth-2D]
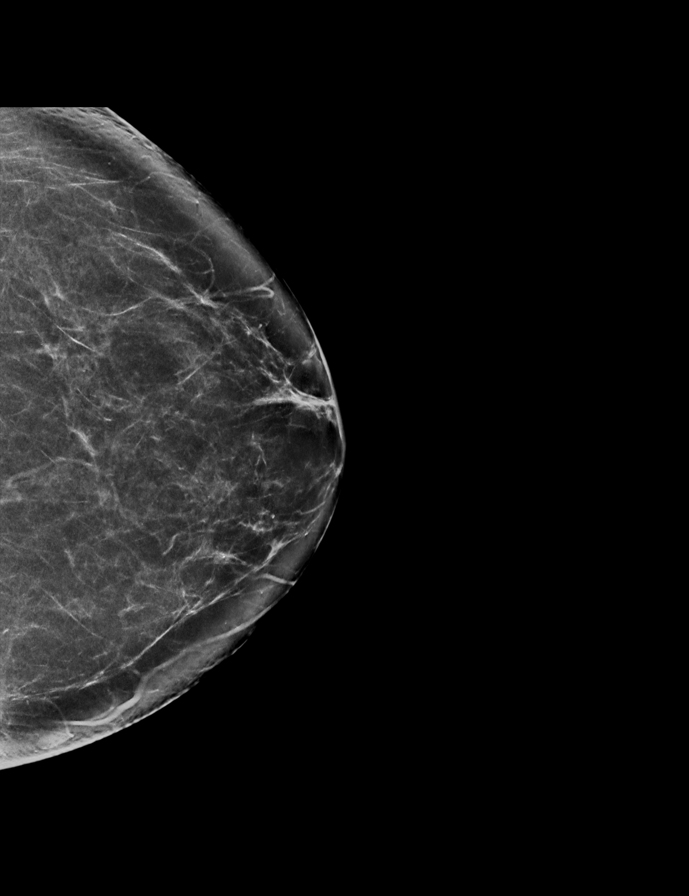

[L CC]
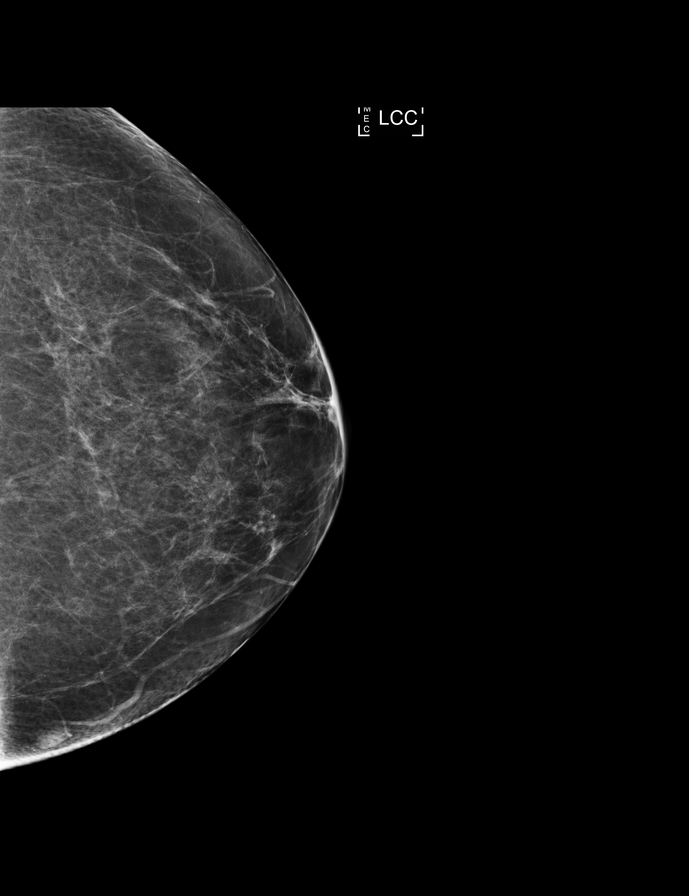

[R CC synth-2D]
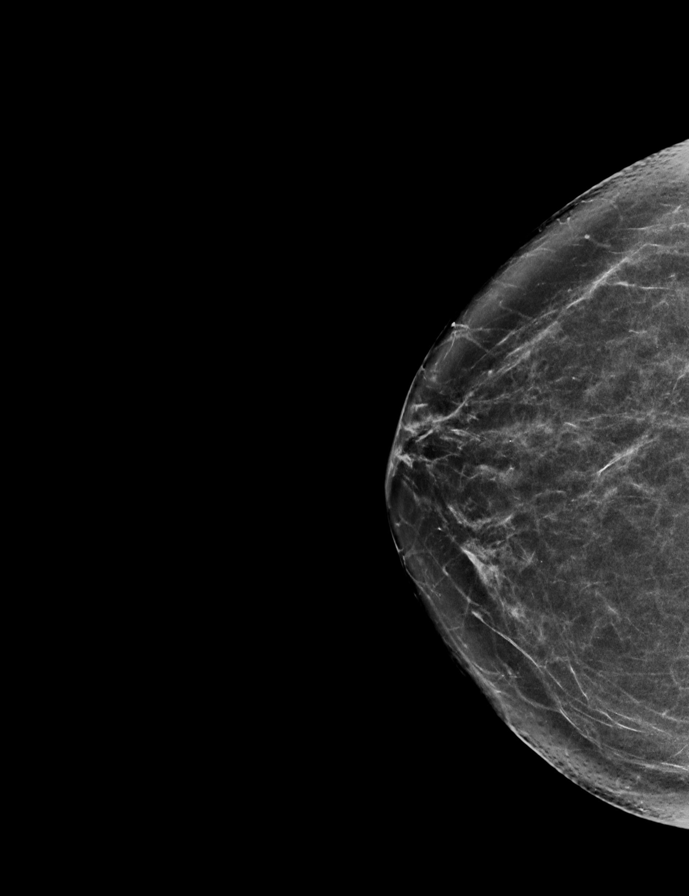

[R MLO synth-2D]
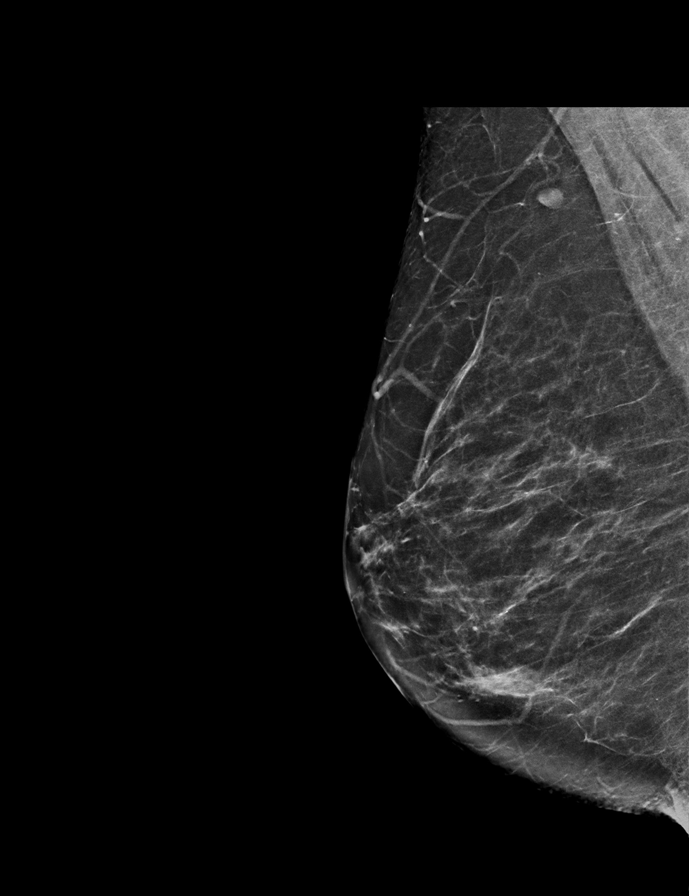

[L MLO tomo · tomo slice 35/70.0]
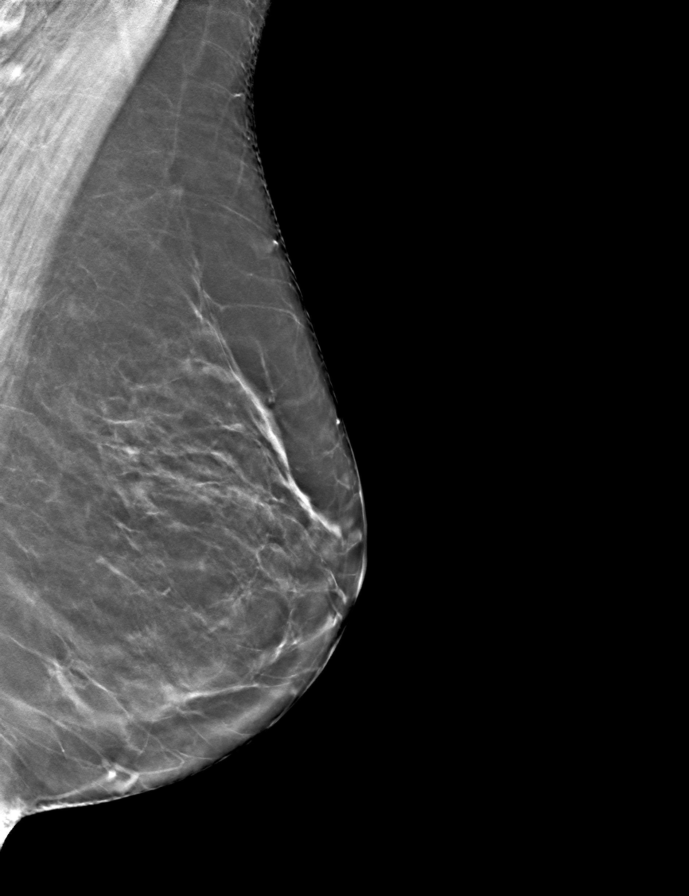

[9 of 28 positions shown; findings below may reference images not displayed]

ACR Breast Density Category b: There are scattered areas of
fibroglandular density.
FINDINGS: There are no findings suspicious for malignancy. Images were
processed with CAD.
IMPRESSION: No mammographic evidence of malignancy. A result letter of this
screening mammogram will be mailed directly to the patient.

RECOMMENDATION:
Screening mammogram in one year. (Code:97-6-RS4)

BI-RADS CATEGORY  1: Negative.

## 2019-03-11 MED ORDER — UNITHROID 100 MCG PO TABS: 100.0000 ug | ORAL_TABLET | Freq: Every day | ORAL | 3 refills | Status: DC

## 2019-03-11 NOTE — Telephone Encounter (Signed)
Patient called to requests the following due to insurance coverage-(previous RX for Unithroid that was sent is not covered through insurance):  MEDICATION: UNITHROID 100 MCG tablet  PHARMACY:   [id number] [place], [place] - [address] [phone] (Phone) [phone] (Fax)    IS THIS A 90 DAY SUPPLY : 30 day supply per insurance rules  IS PATIENT OUT OF MEDICATION: No  IF NOT; HOW MUCH IS LEFT: 1 week  LAST APPOINTMENT DATE: @10 /29/2020  NEXT APPOINTMENT DATE:@Visit  date not found  DO WE HAVE YOUR PERMISSION TO LEAVE A DETAILED MESSAGE: Yes  OTHER COMMENTS:    **Let patient know to contact pharmacy at the end of the day to make sure medication is ready. **  ** Please notify patient to allow 48-72 hours to process**  **Encourage patient to contact the pharmacy for refills or they can request refills through [facility]**

## 2019-03-11 NOTE — Telephone Encounter (Signed)
Rx sent for 30 day supply

## 2019-03-16 ENCOUNTER — Other Ambulatory Visit: Payer: Self-pay

## 2019-03-16 ENCOUNTER — Telehealth: Payer: Self-pay | Admitting: Endocrinology

## 2019-03-16 MED ORDER — LEVOTHYROXINE SODIUM 100 MCG PO TABS: 100.0000 ug | ORAL_TABLET | Freq: Every day | ORAL | 1 refills | Status: DC

## 2019-03-16 NOTE — Telephone Encounter (Signed)
Rx sent 

## 2019-03-16 NOTE — Telephone Encounter (Signed)
Patient called and per [facility] if we can send Levothyroxine instead of Unithroid she will have a $0 copay for her 90 day supply

## 2019-03-18 DIAGNOSIS — J301 Allergic rhinitis due to pollen: Secondary | ICD-10-CM | POA: Diagnosis not present

## 2019-03-23 DIAGNOSIS — J3089 Other allergic rhinitis: Secondary | ICD-10-CM | POA: Diagnosis not present

## 2019-03-24 DIAGNOSIS — J3089 Other allergic rhinitis: Secondary | ICD-10-CM | POA: Diagnosis not present

## 2019-03-24 DIAGNOSIS — J3081 Allergic rhinitis due to animal (cat) (dog) hair and dander: Secondary | ICD-10-CM | POA: Diagnosis not present

## 2019-03-30 DIAGNOSIS — J3089 Other allergic rhinitis: Secondary | ICD-10-CM | POA: Diagnosis not present

## 2019-03-30 DIAGNOSIS — J3081 Allergic rhinitis due to animal (cat) (dog) hair and dander: Secondary | ICD-10-CM | POA: Diagnosis not present

## 2019-04-05 DIAGNOSIS — R350 Frequency of micturition: Secondary | ICD-10-CM | POA: Diagnosis not present

## 2019-04-05 DIAGNOSIS — N952 Postmenopausal atrophic vaginitis: Secondary | ICD-10-CM | POA: Diagnosis not present

## 2019-04-09 DIAGNOSIS — J3089 Other allergic rhinitis: Secondary | ICD-10-CM | POA: Diagnosis not present

## 2019-04-09 DIAGNOSIS — J3081 Allergic rhinitis due to animal (cat) (dog) hair and dander: Secondary | ICD-10-CM | POA: Diagnosis not present

## 2019-04-29 ENCOUNTER — Other Ambulatory Visit: Payer: Self-pay

## 2019-04-29 ENCOUNTER — Encounter: Payer: Self-pay | Admitting: Family Medicine

## 2019-04-29 ENCOUNTER — Ambulatory Visit (INDEPENDENT_AMBULATORY_CARE_PROVIDER_SITE_OTHER): Payer: Medicare Other | Admitting: Family Medicine

## 2019-04-29 ENCOUNTER — Other Ambulatory Visit: Payer: Medicare Other

## 2019-04-29 VITALS — Ht 62.0 in | Wt 130.6 lb

## 2019-04-29 DIAGNOSIS — J3089 Other allergic rhinitis: Secondary | ICD-10-CM

## 2019-04-29 DIAGNOSIS — J069 Acute upper respiratory infection, unspecified: Secondary | ICD-10-CM

## 2019-04-29 NOTE — Patient Instructions (Addendum)
   Restart the montelukast daily. Drink lots of water. Sinus rinses as needed Decongestants such as sudafed can be used as needed for ear pressure/pain. Expectorant such as guaifenesin (mucinex) can help with any cough due to drainage and thick mucus. I recommend getting COVID testing.  This may be a common cold, but you really can't differentiate without testing, and given that your elderly mother-in-law is coming, I'd try and stay as isolated and safe as possible during your illness, especially while awaiting test results.  For COVID testing:   Testing at [facility] sites are now by appointment only.  Go to NicTax.com.pt or text COVID to [phone]. google "find my test site" and put in your zip. Check with health department and [facility].  This information is directly available on the CDC website: [email or web address].co.za.html    Source:CDC Reference to specific commercial products, manufacturers, companies, or trademarks does not constitute its endorsement or recommendation by the [place], [place], or Centers for [employer] and Prevention.

## 2019-04-29 NOTE — Progress Notes (Signed)
Start time: 9:42 End time: 10:08  Virtual Visit via Video Note  I connected with Theresa Schneider on 04/29/19  by a video enabled telemedicine application and verified that I am speaking with the correct person using two identifiers.  Location: Patient: home Provider: office   I discussed the limitations of evaluation and management by telemedicine and the availability of in person appointments. The patient expressed understanding and agreed to proceed.  History of Present Illness:  Chief Complaint  Patient presents with  . Cough    started with a ST Monday night around [place] went away Tues afternoon and then she started having ear pressure. (R worse then L) Exposed to grandchildren-she now has a runny nose and hoarseness. Coughing but not all the time, sneezing. No fever.    3 nights ago she started with sore throat, which resolved.  2 days ago she had R>L ear pressure, runny nose, hoarseness and sneezing.  She also had sinus pressure.  Nasal drainage is clear. She used albuterol yesterday, may have helped a little, but doesn't like how jittery it makes her feel.  She has h/o allergies, and gets something like this once a year. She sees Dr. Donneta Romberg and gets allergy shots weekly.  She hasn't gone in the last couple of weeks (was out of town).  She hasn't taken her singulair in the last week or so. Got tired of taking a lot of pills.  Her allergies are year-round (dust, mold, mildew).  She no longer has sinus pressure or ear pressure. Today she just has hoarseness, but has improved some as the morning progressed. Sometimes she gets a little wheezy/asthmatic when she wears a mask for prolonged periods of time, as she did recently when visiting her parents.  She feels like she has to cough, but isn't coughing much.  She has exhausted from caring for her parents, feels run down. Today she doesn't feel too tired.   +sick exposures to her young grandchildren. They had negatives tests 3  days ago. The youngest grandchild had a similar illness to what she is experiencing. They were there Wednesday through Saturday.  He was coughing/sneezing during the visit. Unsure of type of test, got result the next day.  One of her daughters had contact with COVID, but was tested and negative.  65 year old mother-in-law is coming for 12 days. She is panicked about being sick with her coming.   PMH, PSH, SH reviewed  Taking vitamin C and Cold-eeze, no other OTC meds for her symptoms.  Outpatient Encounter Medications as of 04/29/2019  Medication Sig Note  . Ascorbic Acid (VITAMIN C PO) Take 5,000 mg by mouth daily.   . Azelastine HCl 0.15 % SOLN    . Calcium-Magnesium-Vitamin D (CALCIUM 500 PO) Take 1 capsule by mouth 2 (two) times daily.    . cholecalciferol (VITAMIN D) 1000 units tablet Take 1,000 Units by mouth daily.   . diphenhydrAMINE (SOMINEX) 25 MG tablet Take 25 mg by mouth at bedtime as needed for sleep.   . fluticasone (FLONASE) 50 MCG/ACT nasal spray Place 1 spray into both nostrils daily.    Marland Kitchen levothyroxine (SYNTHROID) 100 MCG tablet Take 1 tablet (100 mcg total) by mouth daily before breakfast.   . Magnesium 500 MG CAPS Take 1 capsule by mouth daily.   Marland Kitchen oxybutynin (DITROPAN-XL) 10 MG 24 hr tablet Take 10 mg by mouth at bedtime.   Marland Kitchen PREMARIN vaginal cream  02/12/2018: Uses peri-urethrally 2-3x/week  . PRESCRIPTION  MEDICATION Inject 1 application into the muscle every 7 (seven) days. Allergy shots weekly   . Zinc Gluconate (COLD-EEZE PO) Take 1 tablet by mouth as needed.   . zoledronic acid (RECLAST) 5 MG/100ML SOLN injection Inject 100 mLs (5 mg total) into the vein once.   . montelukast (SINGULAIR) 10 MG tablet Take 10 mg by mouth daily. 04/29/2019: Hasn't taken in a week or so  . omeprazole (PRILOSEC) 40 MG capsule TAKE ONE CAPSULE BY MOUTH DAILY BEFORE DINNER (Patient not taking: Reported on 04/29/2019)   . PROAIR HFA 108 (90 Base) MCG/ACT inhaler INHALE 1 2 PUFFS AS  NEEDED EVERY 4 6 HRS AS NEEDED FOR COUGH, WHEEZE AND SHORTNESS OF BREATH 04/29/2019: Used just once yesterday, none for months prior to that  . [DISCONTINUED] solifenacin (VESICARE) 5 MG tablet Take 5 mg by mouth daily.    No facility-administered encounter medications on file as of 04/29/2019.    Allergies  Allergen Reactions  . Codeine Phosphate Nausea And Vomiting  . Latex Itching    senitivity   ROS:  No fever or chills (none known, doesn't have thermometer).  No loss of smell or taste (somewhat different due to congestion).  Denies nausea, vomiting, abdominal, diarrhea, urinary complaints.  Denies bleeding, bruising, rashes.   Observations/Objective:  Ht 5\' 2"  (1.575 m)   Wt 130 lb 9.6 oz (59.2 kg)   LMP 01/04/2005   BMI 23.89 kg/m    Pleasant female, in no distress. She has some sniffles, sounds congested. She is speaking easily, with no significant coughing, in no distress She is alert, oriented, cranial nerves are grossly intact. Nose is a little pink (from wiping) Exam is otherwise limited due to virtual nature of the visit.   Assessment and Plan:  Viral upper respiratory tract infection - supportive measures reviewed.  Cannot r/o COVID, so testing recommended.   Perennial allergic rhinitis - resume montelukast, continue other medications   Restart the montelukast daily. Drink lots of water. Sinus rinses as needed Decongestants such as sudafed can be used as needed for ear pressure/pain. Expectorant such as guaifenesin (mucinex) can help with any cough due to drainage and thick mucus. I recommend getting COVID testing.  This may be a common cold, but you really can't differentiate without testing, and given that your elderly mother-in-law is coming, I'd try and stay as isolated and safe as possible during your illness, especially while awaiting test results.    Follow Up Instructions:    I discussed the assessment and treatment plan with the patient. The  patient was provided an opportunity to ask questions and all were answered. The patient agreed with the plan and demonstrated an understanding of the instructions.   The patient was advised to call back or seek an in-person evaluation if the symptoms worsen or if the condition fails to improve as anticipated.  I provided 26 minutes of non-face-to-face time during this encounter.   Vikki Ports, MD

## 2019-05-17 DIAGNOSIS — J3089 Other allergic rhinitis: Secondary | ICD-10-CM | POA: Diagnosis not present

## 2019-05-25 DIAGNOSIS — Z23 Encounter for immunization: Secondary | ICD-10-CM | POA: Diagnosis not present

## 2019-06-02 ENCOUNTER — Telehealth: Payer: Self-pay | Admitting: Family Medicine

## 2019-06-02 NOTE — Telephone Encounter (Signed)
Pt called and said she got the vaccine last Tuesday and since last Thursday she has had a headache and has been dizzy. She got tested for the virus on Monday and her results came back positive today. She is wondering if it may be positive because of the vaccine? She isnt sure what to do at this point but needs some advice.

## 2019-06-02 NOTE — Telephone Encounter (Signed)
Left message for patient to call me back. 

## 2019-06-02 NOTE — Telephone Encounter (Signed)
The vaccine cannot cause the virus (or a positive test). I'm assuming it was a regular COVID test, and not an antibody test. Please document which vaccine and abstract. I'm assuming it is mRNA [employer]).   Offer virtual if she has concerns/questions regarding COVID.

## 2019-06-03 ENCOUNTER — Other Ambulatory Visit: Payer: Self-pay

## 2019-06-03 MED ORDER — LEVOTHYROXINE SODIUM 100 MCG PO TABS: 100.0000 ug | ORAL_TABLET | Freq: Every day | ORAL | 1 refills | Status: DC

## 2019-06-03 NOTE — Telephone Encounter (Signed)
Rx sent 

## 2019-06-03 NOTE — Telephone Encounter (Signed)
Patient advised, virtual offered and she will call back if she changes her mind.

## 2019-06-03 NOTE — Telephone Encounter (Signed)
MEDICATION: levothyroxine (SYNTHROID) 100 MCG tablet  PHARMACY:  [facility]  [place], [place], [place] [address] [phone]  IS THIS A 90 DAY SUPPLY : yes  IS PATIENT OUT OF MEDICATION: no  IF NOT; HOW MUCH IS LEFT: 18  LAST APPOINTMENT DATE: 10/08/18  NEXT APPOINTMENT DATE: 10/11/19  DO WE HAVE YOUR PERMISSION TO LEAVE A DETAILED MESSAGE: yes  OTHER COMMENTS: [facility] [place]   **Let patient know to contact pharmacy at the end of the day to make sure medication is ready. **  ** Please notify patient to allow 48-72 hours to process**  **Encourage patient to contact the pharmacy for refills or they can request refills through [facility]**

## 2019-06-14 ENCOUNTER — Other Ambulatory Visit: Payer: Self-pay

## 2019-06-14 MED ORDER — LEVOTHYROXINE SODIUM 100 MCG PO TABS: 100.0000 ug | ORAL_TABLET | Freq: Every day | ORAL | 1 refills | Status: DC

## 2019-06-18 ENCOUNTER — Ambulatory Visit: Payer: Medicare Other

## 2019-06-22 DIAGNOSIS — Z23 Encounter for immunization: Secondary | ICD-10-CM | POA: Diagnosis not present

## 2019-06-30 DIAGNOSIS — J3081 Allergic rhinitis due to animal (cat) (dog) hair and dander: Secondary | ICD-10-CM | POA: Diagnosis not present

## 2019-06-30 DIAGNOSIS — J3089 Other allergic rhinitis: Secondary | ICD-10-CM | POA: Diagnosis not present

## 2019-07-02 DIAGNOSIS — J3081 Allergic rhinitis due to animal (cat) (dog) hair and dander: Secondary | ICD-10-CM | POA: Diagnosis not present

## 2019-07-02 DIAGNOSIS — J3089 Other allergic rhinitis: Secondary | ICD-10-CM | POA: Diagnosis not present

## 2019-07-05 DIAGNOSIS — J3089 Other allergic rhinitis: Secondary | ICD-10-CM | POA: Diagnosis not present

## 2019-07-13 DIAGNOSIS — J3089 Other allergic rhinitis: Secondary | ICD-10-CM | POA: Diagnosis not present

## 2019-07-16 DIAGNOSIS — J3089 Other allergic rhinitis: Secondary | ICD-10-CM | POA: Diagnosis not present

## 2019-07-19 DIAGNOSIS — J3089 Other allergic rhinitis: Secondary | ICD-10-CM | POA: Diagnosis not present

## 2019-07-26 DIAGNOSIS — J3089 Other allergic rhinitis: Secondary | ICD-10-CM | POA: Diagnosis not present

## 2019-07-26 DIAGNOSIS — J3081 Allergic rhinitis due to animal (cat) (dog) hair and dander: Secondary | ICD-10-CM | POA: Diagnosis not present

## 2019-08-11 DIAGNOSIS — J3089 Other allergic rhinitis: Secondary | ICD-10-CM | POA: Diagnosis not present

## 2019-08-18 DIAGNOSIS — J3089 Other allergic rhinitis: Secondary | ICD-10-CM | POA: Diagnosis not present

## 2019-08-26 DIAGNOSIS — J3089 Other allergic rhinitis: Secondary | ICD-10-CM | POA: Diagnosis not present

## 2019-08-26 DIAGNOSIS — J3081 Allergic rhinitis due to animal (cat) (dog) hair and dander: Secondary | ICD-10-CM | POA: Diagnosis not present

## 2019-08-30 ENCOUNTER — Ambulatory Visit: Payer: Self-pay | Admitting: Family Medicine

## 2019-09-07 DIAGNOSIS — J3081 Allergic rhinitis due to animal (cat) (dog) hair and dander: Secondary | ICD-10-CM | POA: Diagnosis not present

## 2019-09-07 DIAGNOSIS — J3089 Other allergic rhinitis: Secondary | ICD-10-CM | POA: Diagnosis not present

## 2019-09-12 NOTE — Patient Instructions (Addendum)
HEALTH MAINTENANCE RECOMMENDATIONS:  It is recommended that you get at least 30 minutes of aerobic exercise at least 5 days/week (for weight loss, you may need as much as 60-90 minutes). This can be any activity that gets your heart rate up. This can be divided in 10-15 minute intervals if needed, but try and build up your endurance at least once a week.  Weight bearing exercise is also recommended twice weekly.  Eat a healthy diet with lots of vegetables, fruits and fiber.  "Colorful" foods have a lot of vitamins (ie green vegetables, tomatoes, red peppers, etc).  Limit sweet tea, regular sodas and alcoholic beverages, all of which has a lot of calories and sugar.  Up to 1 alcoholic drink daily may be beneficial for women (unless trying to lose weight, watch sugars).  Drink a lot of water.  Calcium recommendations are 1200-1500 mg daily (1500 mg for postmenopausal women or women without ovaries), and vitamin D 1000 IU daily.  This should be obtained from diet and/or supplements (vitamins), and calcium should not be taken all at once, but in divided doses.  Monthly self breast exams and yearly mammograms for women over the age of 53 is recommended.  Sunscreen of at least SPF 30 should be used on all sun-exposed parts of the skin when outside between the hours of 10 am and 4 pm (not just when at beach or pool, but even with exercise, golf, tennis, and yard work!)  Use a sunscreen that says "broad spectrum" so it covers both UVA and UVB rays, and make sure to reapply every 1-2 hours.  Remember to change the batteries in your smoke detectors when changing your clock times in the spring and fall. Carbon monoxide detectors are recommended for your home.  Use your seat belt every time you are in a car, and please drive safely and not be distracted with cell phones and texting while driving.   Ms. Soderman , Thank you for taking time to come for your Welcome to Medicare Visit. I appreciate your ongoing  commitment to your health goals. Please review the following plan we discussed and let me know if I can assist you in the future.   This is a list of the screening recommended for you and due dates:   Health Maintenance  Topic Date Due  . HIV Screening  Completed  . Pneumonia vaccines (1 of 2 - PCV13) Never done  . Flu Shot   Fall 2021  . Mammogram  10/15/2019  . Pap Smear  07/04/2021  . Colon Cancer Screening  04/14/2022  . Tetanus Vaccine  11/22/2023  . DEXA scan (bone density measurement)  Completed  . COVID-19 Vaccine  Completed  .  Hepatitis C: One time screening is recommended by Center for Disease Control  (CDC) for  adults born from [birth year] through 1967.   Completed   [id number] was given today. You will get a pneumovax next year. Bone density will be due again 01/2021 (ordered likely by Dr. Dwyane Dee)  I recommend getting the new shingles vaccine (Shingrix). You will need to get this from the pharmacy rather than our office, as it is covered by Medicare Part D.  It is a series of 2 injections, spaced 2 months apart. The pharmacy usually recommends waiting 4 weeks after other vaccines before starting this series.  Talk to Dr. Gilford Rile about other medications, if you aren't ready to try the recommended injections.  Not sure which he would recommend, but others include  Toviaz, Myrbetriq.  Considering seeing your orthopedist if ongoing/chronic pain in wrist and shoulder; injections might be helpful.  Versus trying topical medications (CBD oil, Biofreeze, Voltaren gel, etc). Consider using wrist brace when babysitting your grandchildren.  Change to trying Prilosec OTC (20mg  omeprazole).  If this isn't effective, you can double up and contact us to refill the 40mg  prescription.  Mindfulness-Based Stress Reduction Mindfulness-based stress reduction (MBSR) is a program that helps people learn to practice mindfulness. Mindfulness is the practice of intentionally paying attention to the present  moment. It can be learned and practiced through techniques such as education, breathing exercises, meditation, and yoga. MBSR includes several mindfulness techniques in one program. MBSR works best when you understand the treatment, are willing to try new things, and can commit to spending time practicing what you learn. MBSR training may include learning about:  How your emotions, thoughts, and reactions affect your body.  New ways to respond to things that cause negative thoughts to start (triggers).  How to notice your thoughts and let go of them.  Practicing awareness of everyday things that you normally do without thinking.  The techniques and goals of different types of meditation. What are the benefits of MBSR? MBSR can have many benefits, which include helping you to:  Develop self-awareness. This refers to knowing and understanding yourself.  Learn skills and attitudes that help you to participate in your own health care.  Learn new ways to care for yourself.  Be more accepting about how things are, and let things go.  Be less judgmental and approach things with an open mind.  Be patient with yourself and trust yourself more. MBSR has also been shown to:  Reduce negative emotions, such as depression and anxiety.  Improve memory and focus.  Change how you sense and approach pain.  Boost your body's ability to fight infections.  Help you connect better with other people.  Improve your sense of well-being. Follow these instructions at home:   Find a local in-person or online MBSR program.  Set aside some time regularly for mindfulness practice.  Find a mindfulness practice that works best for you. This may include one or more of the following: ? Meditation. Meditation involves focusing your mind on a certain thought or activity. ? Breathing awareness exercises. These help you to stay present by focusing on your breath. ? Body scan. For this practice, you lie  down and pay attention to each part of your body from head to toe. You can identify tension and soreness and intentionally relax parts of your body. ? Yoga. Yoga involves stretching and breathing, and it can improve your ability to move and be flexible. It can also provide an experience of testing your body's limits, which can help you release stress. ? Mindful eating. This way of eating involves focusing on the taste, texture, color, and smell of each bite of food. Because this slows down eating and helps you feel full sooner, it can be an important part of a weight-loss plan.  Find a podcast or recording that provides guidance for breathing awareness, body scan, or meditation exercises. You can listen to these any time when you have a free moment to rest without distractions.  Follow your treatment plan as told by your health care provider. This may include taking regular medicines and making changes to your diet or lifestyle as recommended. How to practice mindfulness To do a basic awareness exercise:  Find a comfortable place to sit.  Pay  attention to the present moment. Observe your thoughts, feelings, and surroundings just as they are.  Avoid placing judgment on yourself, your feelings, or your surroundings. Make note of any judgment that comes up, and let it go.  Your mind may wander, and that is okay. Make note of when your thoughts drift, and return your attention to the present moment. To do basic mindfulness meditation:  Find a comfortable place to sit. This may include a stable chair or a firm floor cushion. ? Sit upright with your back straight. Let your arms fall next to your side with your hands resting on your legs. ? If sitting in a chair, rest your feet flat on the floor. ? If sitting on a cushion, cross your legs in front of you.  Keep your head in a neutral position with your chin dropped slightly. Relax your jaw and rest the tip of your tongue on the roof of your mouth.  Drop your gaze to the floor. You can close your eyes if you like.  Breathe normally and pay attention to your breath. Feel the air moving in and out of your nose. Feel your belly expanding and relaxing with each breath.  Your mind may wander, and that is okay. Make note of when your thoughts drift, and return your attention to your breath.  Avoid placing judgment on yourself, your feelings, or your surroundings. Make note of any judgment or feelings that come up, let them go, and bring your attention back to your breath.  When you are ready, lift your gaze or open your eyes. Pay attention to how your body feels after the meditation. Where to find more information You can find more information about MBSR from:  Your health care provider.  Community-based meditation centers or programs.  Programs offered near you. Summary  Mindfulness-based stress reduction (MBSR) is a program that teaches you how to intentionally pay attention to the present moment. It is used with other treatments to help you cope better with daily stress, emotions, and pain.  MBSR focuses on developing self-awareness, which allows you to respond to life stress without judgment or negative emotions.  MBSR programs may involve learning different mindfulness practices, such as breathing exercises, meditation, yoga, body scan, or mindful eating. Find a mindfulness practice that works best for you, and set aside time for it on a regular basis. This information is not intended to replace advice given to you by your health care provider. Make sure you discuss any questions you have with your health care provider. Document Revised: 04/04/2017 Document Reviewed: 08/29/2016 Elsevier Patient Education  [place].

## 2019-09-12 NOTE — Progress Notes (Signed)
Chief Complaint  Patient presents with  . welcome to medicare    welcome to medicare, dicusss bladder issues, covid in January and now has spells of anixety, out of energy    Theresa Schneider is a 64 y.o. female who presents for Welcome to Medicare visit, and follow-up on chronic medical conditions.   She has the following concerns:  She had COVID in January--headache x 6 days/nights and dizziness, no other symptoms.  She finds that some things just seem to be worse since then, as described below.  L AC joint impingement seems worse than prior to COVID, and L wrist pain is also worse.  She has other "weird pains"--neck, upper arms. +lifts grandkids, but not regularly, as they are not in town.  She has soreness at her L [facility] joint all the time.  Not bad enough to even take tylenol.  Has more headaches than prior to COVID (in addition to those caused by Ditropan).  She worries a lot about her parents. She has intermittent anxiety, comes and goes.  She was very anxious when she had COVID and hasn't gone back down to normal yet.   Sleeping much better than in the past, worse when anxious. She has been in therapy for years, but now that she Medicare, it isn't covered, so cut back in frequency.  Has been talking to him over the phone every 3 weeks.  Husband has throat cancer, had surgery in March, and is doing better overall. Caring for elderly parents who live in [place] (84, 86).  38 yo nephew lives there, helps, but isn't the most responsible.  She still goes back to take them to doctors.  Cystocele and OAB:  Under the care of Dr. Gilford Rile at [facility], last seen 03/2019. She is on Ditropan and topical premarin cream. She reports the medication gives her headaches.  Previously took vesicare, which also caused headaches/SE. Discussed Botox injections as a possible treatment, and she worries about this. She has stress urinary incontinence (with sneezing/laughing). Denies urge incontinence, unless she  doesn't take Ditropan.    GERD: She bought a bed where the head elevates.  She takes omeprazole only when sleeping flat (traveling, etc).  She uses Tums prn. She used to use omeprazole prn with foods that trigger, but had some periodic flares. Eating earlier since retirement has helped.  Denies dysphagia.   She has h/o vitamin D deficiency, monitored by Dr. Dwyane Dee. She is currently taking 1000 IU of D3 daily, and Ca+D once or twice daily. Last level was 50.37 in 10/2018.  Osteoporosis--previously treated with Actonel x 2 years in the distant past, also took Prolia (twice, changed due to cost). She has been getting yearly Reclast infusions since 10/2013; last infusion was through Dr. Dwyane Dee on 11/2018. Last DEXA was 01/2019. T-2.3 spine, -1.8 L fem neck (improved from last check-- 04/2016, -2.4 at the spine, -2.0 at left femoral neck.).  She is compliant with her Calcium and D.  Walks, no other weight-bearing exercise.  H/o Grave's disease, s/p RAI treatment, with resultant hypothyroidism. Under the care of Dr. Dwyane Dee. She denies thyroid symptoms. Lab Results  Component Value Date   TSH 1.76 10/06/2018   Allergies: On immunotherapy, under the care of Dr. Donneta Romberg. She is on Flonase and montelukast. She tries to avoid antihistamines due to contributing to dry eyes, does use Zyrtec occasionally.   Immunization History  Administered Date(s) Administered  . DTaP 10/05/2006  . Influenza,inj,Quad PF,6+ Mos 03/10/2018  . Influenza-Unspecified 02/19/2017  .  Moderna SARS-COVID-2 Vaccination 05/18/2019, 06/21/2019  . Tdap 11/21/2013   Had her flu shot at Dr. Rush [facility] office Last Pap smear:07/2016, normal with no high risk HPV Last mammogram:10/2018 Last colonoscopy:04/2012 Dr. Bronson Ing, repeat 10 years Last DEXA: 01/2019. T-2.3 spine, -1.8 L fem neck  Dentist:twice yearly Ophtho:yearly, wears glasses. Exercise: Walks 2-5x/week (30-45 mins), no weight-bearing exercise. Lipid screen: Lab  Results  Component Value Date   CHOL 192 08/05/2016   HDL 70 08/05/2016   LDLCALC 107 (H) 08/05/2016   TRIG 75 08/05/2016   CHOLHDL 2.7 08/05/2016   Other doctors caring for patient include: Allergist: Dr. Donneta Romberg Ophtho: Dr. Gershon Crane Endocrinologist: Dr. Dwyane Dee Urologist: Dr. Lovena Neighbours Dentist: Dr. Loma Newton Derm: Dr. Jarome Matin Ortho: Dr. Noemi Chapel (not seen in a while) GI: Dr. Olevia Perches (retired). Holistic med: Dr. Bettey Costa (in [facility])  Depression screen:  PHQ-2 score of 1, occasional feels down Fall screen: negative Functional Status survey: unremarkable, see full screen Mini-Cog screen: 4/5--missed 1 in recall, normal clock drawing. Repeat testing of 3 word recall--got all 3 and remembered all 3 words from the nurse earlier See epic for full screens  End of Life Discussion:  Patient does not have a living will and medical power of attorney  Outpatient Encounter Medications as of 09/13/2019  Medication Sig Note  . Azelastine HCl 0.15 % SOLN    . Calcium-Magnesium-Vitamin D (CALCIUM 500 PO) Take 1 capsule by mouth 2 (two) times daily.    . Cetirizine HCl (ZYRTEC PO) Take by mouth.   . cholecalciferol (VITAMIN D) 1000 units tablet Take 1,000 Units by mouth daily.   . fluticasone (FLONASE) 50 MCG/ACT nasal spray Place 1 spray into both nostrils daily.    Marland Kitchen levothyroxine (SYNTHROID) 100 MCG tablet Take 1 tablet (100 mcg total) by mouth daily before breakfast.   . Magnesium 500 MG CAPS Take 1 capsule by mouth daily.   . montelukast (SINGULAIR) 10 MG tablet Take 10 mg by mouth daily. 09/13/2019: Doesn't always take every day  . omeprazole (PRILOSEC) 40 MG capsule TAKE ONE CAPSULE BY MOUTH DAILY BEFORE DINNER   . PREMARIN vaginal cream  02/12/2018: Uses peri-urethrally 2-3x/week  . PRESCRIPTION MEDICATION Inject 1 application into the muscle every 7 (seven) days. Allergy shots weekly   . PROAIR HFA 108 (90 Base) MCG/ACT inhaler INHALE 1 2 PUFFS AS NEEDED EVERY 4 6 HRS AS NEEDED FOR  COUGH, WHEEZE AND SHORTNESS OF BREATH 04/29/2019: Used just once yesterday, none for months prior to that  . Zinc Gluconate (COLD-EEZE PO) Take 1 tablet by mouth as needed.   . zoledronic acid (RECLAST) 5 MG/100ML SOLN injection Inject 100 mLs (5 mg total) into the vein once.   . Ascorbic Acid (VITAMIN C PO) Take 5,000 mg by mouth daily.   . diphenhydrAMINE (SOMINEX) 25 MG tablet Take 25 mg by mouth at bedtime as needed for sleep.   Marland Kitchen oxybutynin (DITROPAN-XL) 10 MG 24 hr tablet Take 10 mg by mouth at bedtime.    No facility-administered encounter medications on file as of 09/13/2019.   Allergies  Allergen Reactions  . Codeine Phosphate Nausea And Vomiting  . Latex Itching    senitivity    ROS:The patient denies anorexia, fever, significant weight changes,vision changes, decreased hearing, ear pain, sore throat, breast concerns, chest pain, palpitations, dizziness, syncope, dyspnea on exertion, cough, swelling, nausea (only if hasn't eaten in a while), vomiting, diarrhea, constipation, abdominal pain, melena, hematochezia, hematuria, dysuria, vaginal bleeding,discharge, odor or itch, genital lesions, numbness, tingling, weakness, tremor,  suspicious skin lesions, depression, abnormal bleeding/bruising, or enlarged lymph nodes. +anxiety and intermittent trouble sleeping. headaches (thinks contributed by Ditropan) Vaginal dryness. No hot flashes or night sweats. Some hip pain if her head is propped up too much when sleeping.  Uses Tylenol prn. L shoulder and thumb/wrist pain, as noted in HPI Chronic congestion/allergies, overall controlled. Nosebleeds only in the winter. Heartburn infrequently, r/b Tums prn. Denies dysphagia Leakageof urinewith sneeze/laughing. Incontinence if not taking OAB med.    PHYSICAL EXAM:  BP 120/80   Pulse 64   Temp 98 F (36.7 C)   Ht 5' 2.75" (1.594 m)   Wt 129 lb (58.5 kg)   LMP 01/04/2005   BMI 23.03 kg/m   Wt Readings from Last 3 Encounters:   09/13/19 129 lb (58.5 kg)  04/29/19 130 lb 9.6 oz (59.2 kg)  02/12/18 128 lb 9.6 oz (58.3 kg)    General Appearance:  Alert, cooperative, talkative female inno distress, appears stated age  Head:  Normocephalic, without obvious abnormality, atraumatic  Eyes:  PERRL, conjunctiva/corneas clear, EOM's intact, fundi benign  Ears:  Normal TM's and external ear canals  Nose: Not examined, wearing mask due to COVID-19 pandemic  Throat: Not examined, wearing mask due to COVID-19 pandemic  Neck: Supple, no lymphadenopathy; thyroid: no enlargement/tenderness/ nodules; no carotid bruit or JVD  Back:  Spine nontender, no curvature, ROM normal, no CVAtenderness  Lungs:  Clear to auscultation bilaterally without wheezes, rales orronchi; respirations unlabored  Chest Wall:  No tenderness or deformity. Not really tender at L [facility] joint  Heart:  Regular rate and rhythm, S1 and S2 normal, no murmur, rub or gallop  Breast Exam:  I believe this exam was overlooked today, not performed (due to the length of the visit).   Abdomen:  Soft, non-tender, nondistended, normoactive bowel sounds,  no masses, no hepatosplenomegaly  Genitalia:  Normal external genitalia without lesions, atrophic changes noted. Only small cystocele noted today.BUS and vagina normal;no cervicalmotion tenderness. No abnormal vaginal discharge. Uterus and adnexa not enlarged, nontender, no masses. Pap not performed  Rectal:  Normal tone, no masses or tenderness; no stool in vault for heme-testing  Extremities: No clubbing, cyanosis or edema  Pulses: 2+ and symmetric all extremities  Skin: Skin color, texture, turgor normal, no rashes or lesions  Lymph nodes: Cervical, supraclavicular nodes normal  Neurologic: Normal strength, sensation and gait; reflexes 2+ and symmetric throughout  Psych: Normal mood, affect, hygiene and grooming.   EKG NSR, rate 65 with  PVC/fusion complexes noted.  Possible LAE.  Poor RWP with early transition.  EKG (other than extra beats) was unchanged from that done in 04/2015.   ASSESSMENT/PLAN:  Welcome to Medicare preventive visit - Plan: Comprehensive metabolic panel, CBC with Differential/Platelet, Lipid panel, EKG 12-Lead  Other osteoporosis, unspecified pathological fracture presence - on reclast infusions yearly per Dr. Dwyane Dee. Cont Ca, D. Encouraged to start weight-bearing exercise  Postablative hypothyroidism - adequately replaced, per Dr. Dwyane Dee. Euthyroid today  Family history of premature CAD - Plan: Lipid panel  Perennial allergic rhinitis - controlled on current regimen and immunotherapy  Need for pneumococcal vaccination - ordered Prevnar, in error Pneumovax given (communication issue with nurse); will get Prevnar-13 next year. Order reversed, patient aware - Plan: Pneumococcal polysaccharide vaccine 23-valent greater than or equal to 2yo subcutaneous/IM, CANCELED: Pneumococcal conjugate vaccine 13-valent  Medication monitoring encounter - Plan: Comprehensive metabolic panel, CBC with Differential/Platelet  Stress due to illness of family member - counseled, to look into caregiver support  for in-law (parents OOT). f/u if worsening anxiety for meds; cont counseling  OAB (overactive bladder) - Ditropan controls, but causing headaches.  To address with Dr. Gilford Rile, discussed other meds to possibly try prior to Botox, if preferred  Gastroesophageal reflux disease without esophagitis - improved since sleeping with HOB elevated. Trial 20mg  omeprazole when traveling/laying flat. If not effective, can refill 40mg  dose  Chronic left shoulder pain - pain at [facility] joint, and also L wrist. Doubt worsening related to COVID. Discussed supportive care, f/u with ortho if worsening   Counseled re: stress/anxiety, mindfulness. Encouraged continued counseling, regular exercise (and to add weight-bearing exercise).  Talk to  Dr. Gilford Rile about other medications, if you aren't ready to try the recommended injections.  Not sure which he would recommend, but others include Toviaz, Myrbetriq.  Considering seeing your orthopedist if ongoing/chronic pain in wrist and shoulder; injections might be helpful.  Versus trying topical medications (CBD oil, Biofreeze, Voltaren gel, etc). Consider using wrist brace when babysitting your grandchildren.  Change to trying Prilosec OTC (20mg  omeprazole).  If this isn't effective, you can double up and contact us to refill the 40mg  prescription.  Discussed monthly self breast exams and yearly mammograms; at least 30 minutes of aerobic activity at least 5 days/week, weight-bearing exercise at least 2x/week; proper sunscreen use reviewed; healthy diet, including goals of calcium and vitamin D intake and alcohol recommendations (less than or equal to 1 drink/day) reviewed; regular seatbelt use; changing batteries in smoke detectors, carbon monoxide detectors. Immunization recommendations discussed--continue yearly flu shots (gets at Dr. Rush [facility] office). Shingrix recommended, to get from pharmacy. Risks/SE reviewed. Pneumovax given, to get prevnar-13 next year (order reversed, in error, see below). Colonoscopy recommendations reviewed, UTD. Pap UTD.  In error, pneumovax was given today instead of Prevnar-13.  Spoke with patient afterwards, and advised her of the reverse order of the plan that we discussed at the visit (will instead give Prevnar-13 next year). Also reviewed her EKG with her over the phone, and that no change (other than PVC) from prior EKG in 2016.  MOST form reviewed/signed, full code, full care She was given paperwork for Living Will and Healthcare Power of Attorney and this was discussed.  Asked to get [place] copies when completed and notarized  F/u 1 year, sooner prn  Total visit time was 65-70+ minutes face-to-face, with additional time spent in chart review and  documentation.   Medicare Attestation I have personally reviewed: The patient's medical and social history Their use of alcohol, tobacco or illicit drugs Their current medications and supplements The patient's functional ability including ADLs,fall risks, home safety risks, cognitive, and hearing and visual impairment Diet and physical activities Evidence for depression or mood disorders  The patient's weight, height, BMI have been recorded in the chart.  I have made referrals, counseling, and provided education to the patient based on review of the above and I have provided the patient with a written personalized care plan for preventive services.

## 2019-09-13 ENCOUNTER — Telehealth: Payer: Self-pay | Admitting: Internal Medicine

## 2019-09-13 ENCOUNTER — Other Ambulatory Visit: Payer: Self-pay

## 2019-09-13 ENCOUNTER — Ambulatory Visit (INDEPENDENT_AMBULATORY_CARE_PROVIDER_SITE_OTHER): Payer: Medicare Other | Admitting: Family Medicine

## 2019-09-13 ENCOUNTER — Encounter: Payer: Self-pay | Admitting: Family Medicine

## 2019-09-13 VITALS — BP 120/80 | HR 64 | Temp 98.0°F | Ht 62.75 in | Wt 129.0 lb

## 2019-09-13 DIAGNOSIS — Z23 Encounter for immunization: Secondary | ICD-10-CM | POA: Diagnosis not present

## 2019-09-13 DIAGNOSIS — G8929 Other chronic pain: Secondary | ICD-10-CM | POA: Diagnosis not present

## 2019-09-13 DIAGNOSIS — M818 Other osteoporosis without current pathological fracture: Secondary | ICD-10-CM | POA: Diagnosis not present

## 2019-09-13 DIAGNOSIS — N3281 Overactive bladder: Secondary | ICD-10-CM | POA: Diagnosis not present

## 2019-09-13 DIAGNOSIS — Z0000 Encounter for general adult medical examination without abnormal findings: Secondary | ICD-10-CM

## 2019-09-13 DIAGNOSIS — E890 Postprocedural hypothyroidism: Secondary | ICD-10-CM | POA: Diagnosis not present

## 2019-09-13 DIAGNOSIS — Z5181 Encounter for therapeutic drug level monitoring: Secondary | ICD-10-CM | POA: Diagnosis not present

## 2019-09-13 DIAGNOSIS — J3089 Other allergic rhinitis: Secondary | ICD-10-CM | POA: Diagnosis not present

## 2019-09-13 DIAGNOSIS — Z6379 Other stressful life events affecting family and household: Secondary | ICD-10-CM | POA: Diagnosis not present

## 2019-09-13 DIAGNOSIS — K219 Gastro-esophageal reflux disease without esophagitis: Secondary | ICD-10-CM | POA: Diagnosis not present

## 2019-09-13 DIAGNOSIS — M25512 Pain in left shoulder: Secondary | ICD-10-CM

## 2019-09-13 DIAGNOSIS — Z8249 Family history of ischemic heart disease and other diseases of the circulatory system: Secondary | ICD-10-CM | POA: Diagnosis not present

## 2019-09-13 NOTE — Telephone Encounter (Signed)
Error

## 2019-09-14 ENCOUNTER — Encounter: Payer: Self-pay | Admitting: Family Medicine

## 2019-09-14 ENCOUNTER — Other Ambulatory Visit: Payer: Self-pay | Admitting: Family Medicine

## 2019-09-14 DIAGNOSIS — Z1231 Encounter for screening mammogram for malignant neoplasm of breast: Secondary | ICD-10-CM

## 2019-09-14 DIAGNOSIS — E7800 Pure hypercholesterolemia, unspecified: Secondary | ICD-10-CM | POA: Insufficient documentation

## 2019-09-14 LAB — CBC WITH DIFFERENTIAL/PLATELET
Basophils Absolute: 0.1 10*3/uL (ref 0.0–0.2)
Basos: 1 %
EOS (ABSOLUTE): 0.1 10*3/uL (ref 0.0–0.4)
Eos: 2 %
Hematocrit: 40.3 % (ref 34.0–46.6)
Hemoglobin: 13.2 g/dL (ref 11.1–15.9)
Immature Grans (Abs): 0 10*3/uL (ref 0.0–0.1)
Immature Granulocytes: 0 %
Lymphocytes Absolute: 2.2 10*3/uL (ref 0.7–3.1)
Lymphs: 26 %
MCH: 28.8 pg (ref 26.6–33.0)
MCHC: 32.8 g/dL (ref 31.5–35.7)
MCV: 88 fL (ref 79–97)
Monocytes Absolute: 0.7 10*3/uL (ref 0.1–0.9)
Monocytes: 9 %
Neutrophils Absolute: 5.1 10*3/uL (ref 1.4–7.0)
Neutrophils: 62 %
Platelets: 395 10*3/uL (ref 150–450)
RBC: 4.58 x10E6/uL (ref 3.77–5.28)
RDW: 13.2 % (ref 11.7–15.4)
WBC: 8.2 10*3/uL (ref 3.4–10.8)

## 2019-09-14 LAB — LIPID PANEL
Chol/HDL Ratio: 3.5 ratio (ref 0.0–4.4)
Cholesterol, Total: 238 mg/dL — ABNORMAL HIGH (ref 100–199)
HDL: 68 mg/dL (ref >39)
LDL Chol Calc (NIH): 150 mg/dL — ABNORMAL HIGH (ref 0–99)
Triglycerides: 116 mg/dL (ref 0–149)
VLDL Cholesterol Cal: 20 mg/dL (ref 5–40)

## 2019-09-14 LAB — COMPREHENSIVE METABOLIC PANEL
ALT: 24 IU/L (ref 0–32)
AST: 27 IU/L (ref 0–40)
Albumin/Globulin Ratio: 2 (ref 1.2–2.2)
Albumin: 4.5 g/dL (ref 3.8–4.8)
Alkaline Phosphatase: 101 IU/L (ref 39–117)
BUN/Creatinine Ratio: 11 — ABNORMAL LOW (ref 12–28)
BUN: 8 mg/dL (ref 8–27)
Bilirubin Total: 0.3 mg/dL (ref 0.0–1.2)
CO2: 23 mmol/L (ref 20–29)
Calcium: 9.2 mg/dL (ref 8.7–10.3)
Chloride: 105 mmol/L (ref 96–106)
Creatinine, Ser: 0.73 mg/dL (ref 0.57–1.00)
GFR calc Af Amer: 100 mL/min/{1.73_m2} (ref >59)
GFR calc non Af Amer: 87 mL/min/{1.73_m2} (ref >59)
Globulin, Total: 2.2 g/dL (ref 1.5–4.5)
Glucose: 87 mg/dL (ref 65–99)
Potassium: 4.5 mmol/L (ref 3.5–5.2)
Sodium: 141 mmol/L (ref 134–144)
Total Protein: 6.7 g/dL (ref 6.0–8.5)

## 2019-09-17 DIAGNOSIS — J3089 Other allergic rhinitis: Secondary | ICD-10-CM | POA: Diagnosis not present

## 2019-09-30 DIAGNOSIS — J3089 Other allergic rhinitis: Secondary | ICD-10-CM | POA: Diagnosis not present

## 2019-09-30 DIAGNOSIS — J3081 Allergic rhinitis due to animal (cat) (dog) hair and dander: Secondary | ICD-10-CM | POA: Diagnosis not present

## 2019-10-06 DIAGNOSIS — J3089 Other allergic rhinitis: Secondary | ICD-10-CM | POA: Diagnosis not present

## 2019-10-08 ENCOUNTER — Other Ambulatory Visit: Payer: 59

## 2019-10-11 ENCOUNTER — Ambulatory Visit: Payer: 59 | Admitting: Endocrinology

## 2019-10-14 DIAGNOSIS — J301 Allergic rhinitis due to pollen: Secondary | ICD-10-CM | POA: Diagnosis not present

## 2019-10-14 DIAGNOSIS — J3089 Other allergic rhinitis: Secondary | ICD-10-CM | POA: Diagnosis not present

## 2019-10-14 DIAGNOSIS — J3081 Allergic rhinitis due to animal (cat) (dog) hair and dander: Secondary | ICD-10-CM | POA: Diagnosis not present

## 2019-10-18 ENCOUNTER — Telehealth: Payer: Self-pay

## 2019-10-18 NOTE — Telephone Encounter (Signed)
Called pt and informed her that because she has Medicare, they will not speak with this office to verify coverage for Reclast. For this reason, it was requested of pt to call her insurance company and verify coverage and once determined, let this office know.  Pt stated that she would do this. Pt also requested that a MyChart message be sent to her with the specific instructions on what to do and ask about when speaking with Medicare. This MyChart message will be sent.

## 2019-10-28 ENCOUNTER — Other Ambulatory Visit: Payer: Self-pay

## 2019-10-28 ENCOUNTER — Ambulatory Visit
Admission: RE | Admit: 2019-10-28 | Discharge: 2019-10-28 | Disposition: A | Discharge status: Home / Self Care | Payer: Medicare Other | Source: Ambulatory Visit | Attending: Family Medicine | Admitting: Family Medicine

## 2019-10-28 DIAGNOSIS — Z1231 Encounter for screening mammogram for malignant neoplasm of breast: Secondary | ICD-10-CM

## 2019-11-18 DIAGNOSIS — J3089 Other allergic rhinitis: Secondary | ICD-10-CM | POA: Diagnosis not present

## 2019-11-18 DIAGNOSIS — J3081 Allergic rhinitis due to animal (cat) (dog) hair and dander: Secondary | ICD-10-CM | POA: Diagnosis not present

## 2019-11-21 ENCOUNTER — Other Ambulatory Visit: Payer: Self-pay | Admitting: Endocrinology

## 2019-11-21 DIAGNOSIS — M810 Age-related osteoporosis without current pathological fracture: Secondary | ICD-10-CM

## 2019-11-21 DIAGNOSIS — E890 Postprocedural hypothyroidism: Secondary | ICD-10-CM

## 2019-11-23 ENCOUNTER — Other Ambulatory Visit: Payer: Self-pay

## 2019-11-23 ENCOUNTER — Other Ambulatory Visit (INDEPENDENT_AMBULATORY_CARE_PROVIDER_SITE_OTHER): Payer: Medicare Other

## 2019-11-23 DIAGNOSIS — E890 Postprocedural hypothyroidism: Secondary | ICD-10-CM

## 2019-11-23 DIAGNOSIS — M810 Age-related osteoporosis without current pathological fracture: Secondary | ICD-10-CM | POA: Diagnosis not present

## 2019-11-23 LAB — T4, FREE: Free T4: 1.11 ng/dL (ref 0.60–1.60)

## 2019-11-23 LAB — BASIC METABOLIC PANEL
BUN: 11 mg/dL (ref 6–23)
CO2: 29 mEq/L (ref 19–32)
Calcium: 9.4 mg/dL (ref 8.4–10.5)
Chloride: 99 mEq/L (ref 96–112)
Creatinine, Ser: 0.68 mg/dL (ref 0.40–1.20)
GFR: 86.55 mL/min (ref >60.00)
Glucose, Bld: 70 mg/dL (ref 70–99)
Potassium: 3.8 mEq/L (ref 3.5–5.1)
Sodium: 134 mEq/L — ABNORMAL LOW (ref 135–145)

## 2019-11-23 LAB — TSH: TSH: 2.06 u[IU]/mL (ref 0.35–4.50)

## 2019-11-23 LAB — VITAMIN D 25 HYDROXY (VIT D DEFICIENCY, FRACTURES): VITD: 46.53 ng/mL (ref 30.00–100.00)

## 2019-11-25 NOTE — Progress Notes (Signed)
Patient ID: Theresa Schneider, female   DOB: Jun 13, 1955, 64 y.o.   MRN: [id number]    Reason for Appointment: Follow-up of thyroid and of osteoporosis   History of Present Illness:   Osteoporosis history: She has previously been evaluated and treated by her gynecologist since about 2004. She was given a trial of Actonel about 10 years ago. She  had difficulty tolerating this because of abdominal discomfort and not clear if she took this regularly, probably not over 2 years  She was subsequently given Prolia starting in about 2012 and she got only 3 injections. The injection was stopped because of lack of insurance coverage.  She got Reclast in 10/2013 since her bone density done by her gynecologist on 09/21/13 showed a reduced T score of -3.5 at the lumbar spine.  Subsequently she has had Reclast about once a year without side effects Last injection was  08/05/2017  Intensity of 12/17 indicated the lumbar spine T-score of -2.4, showing statistically significant increase in BMD of Lumbar spine and left hip   Results from bone density of 01/14/2019 shows the BMD measured at AP Spine L1-L4 is 0.904 g/cm2 with a T-score of -2.3.  VITAMIN D.: She has had normal levels She is taking calcium and vitamin D combination Does not like to drink milk Also on vitamin D3, 1000 U daily   Lab Results  Component Value Date   VD25OH 46.53 11/23/2019    HYPOTHYROIDISM: Discussed in review of symptoms   Allergies as of 11/26/2019      Reactions   Codeine Phosphate Nausea And Vomiting   Latex Itching   senitivity      Medication List       Accurate as of November 26, 2019  8:35 AM. If you have any questions, ask your nurse or doctor.        Azelastine HCl 0.15 % Soln   CALCIUM 500 PO Take 1 capsule by mouth 2 (two) times daily.   cholecalciferol 1000 units tablet Commonly known as: VITAMIN D Take 1,000 Units by mouth daily.   COLD-EEZE PO Take 1 tablet by mouth as needed.     diphenhydrAMINE 25 MG tablet Commonly known as: SOMINEX Take 25 mg by mouth at bedtime as needed for sleep.   fluticasone 50 MCG/ACT nasal spray Commonly known as: FLONASE Place 1 spray into both nostrils daily.   levothyroxine 100 MCG tablet Commonly known as: SYNTHROID Take 1 tablet (100 mcg total) by mouth daily before breakfast.   Magnesium 500 MG Caps Take 1 capsule by mouth daily.   montelukast 10 MG tablet Commonly known as: SINGULAIR Take 10 mg by mouth daily.   omeprazole 40 MG capsule Commonly known as: PRILOSEC TAKE ONE CAPSULE BY MOUTH DAILY BEFORE DINNER   oxybutynin 10 MG 24 hr tablet Commonly known as: DITROPAN-XL Take 10 mg by mouth at bedtime.   Premarin vaginal cream Generic drug: conjugated estrogens   PRESCRIPTION MEDICATION Inject 1 application into the muscle every 7 (seven) days. Allergy shots weekly   ProAir HFA 108 (90 Base) MCG/ACT inhaler Generic drug: albuterol INHALE 1 2 PUFFS AS NEEDED EVERY 4 6 HRS AS NEEDED FOR COUGH, WHEEZE AND SHORTNESS OF BREATH   VITAMIN C PO Take 5,000 mg by mouth daily.   zoledronic acid 5 MG/100ML Soln injection Commonly known as: RECLAST Inject 100 mLs (5 mg total) into the vein once.   ZYRTEC PO Take by mouth.       Past Medical  History:  Diagnosis Date  . Allergy    SEASONAL/PERENNIAL  . Anxiety   . Asthma    allergy related  . Depression   . GAD (generalized anxiety disorder)   . Gastritis 2013   from NSAID's after foot surgery  . Hallux rigidus   . Hyperthyroidism     hx graves disease. dr Demetrios Isaacs pcp     . Hypothyroidism    (since I-131 treatment)  . Osteoporosis 2009  . Tendinitis of right ankle 2007    Past Surgical History:  Procedure Laterality Date  . BREAST EXCISIONAL BIOPSY Right 1994   benign   . BREAST SURGERY     fibroadenoma, right  . hallux rigidus Right 11/06/2011  . ROTATOR CUFF REPAIR     right  . TONSILLECTOMY      Family History  Problem Relation Age of  Onset  . Arthritis Mother        OSTEO  . Hypothyroidism Mother   . Ulcers Mother        in esophagus  . Vision loss Mother        macular hole  . Heart attack Father 69  . Colon polyps Father        benign  . Heart disease Father        MI at 34; stents and pacemaker 19; CHF  . Hypothyroidism Father   . Diabetes Brother        obese; pre-diabetic  . Heart disease Brother 59       MI  . Hypothyroidism Sister   . Colon polyps Sister   . Depression Sister        poss bipolar  . Hypothyroidism Sister        Hashimoto's  . Deep vein thrombosis Brother   . Colon cancer Paternal Grandfather 19  . Cancer Maternal Grandfather        esophageal  . Esophageal cancer Maternal Grandfather 58  . Breast cancer Paternal Aunt   . Colon polyps Paternal Aunt   . COPD Paternal 64   . Osteoporosis Paternal Aunt   . Osteoporosis Cousin   . Osteoporosis Paternal Grandmother   . Cancer Maternal Grandmother        leukemia  . Deep vein thrombosis Other   . Breast cancer Maternal Aunt   . Rectal cancer Neg Hx   . Stomach cancer Neg Hx     Social History:  reports that she has never smoked. She has never used smokeless tobacco. She reports current alcohol use. She reports that she does not use drugs.  Allergies:  Allergies  Allergen Reactions  . Codeine Phosphate Nausea And Vomiting  . Latex Itching    senitivity   ROS     Wt Readings from Last 3 Encounters:  11/26/19 125 lb 6.4 oz (56.9 kg)  09/13/19 129 lb (58.5 kg)  04/29/19 130 lb 9.6 oz (59.2 kg)    She has hypothyroidism which was first diagnosed after treatment for Graves' disease several years ago  She was previously taking the brand-name Synthroid, from the Synthroid direct program  Now she is only taking generic because of Medicare coverage She has been trying to lose weight with diet and exercise She takes her levothyroxine daily in the morning before eating  Has been feeling fairly well overall and is quite  active  Her TSH is again very normal at 2.1   Lab Results  Component Value Date   TSH 2.06 11/23/2019   TSH  1.76 10/06/2018   TSH 1.22 07/29/2017   FREET4 1.11 11/23/2019   FREET4 1.07 10/06/2018   FREET4 1.14 07/29/2017      Examination:   BP 120/78 (BP Location: Left Arm, Patient Position: Sitting, Cuff Size: Normal)   Pulse 82   Ht 5' 2.75" (1.594 m)   Wt 125 lb 6.4 oz (56.9 kg)   LMP 01/04/2005   SpO2 97%   BMI 22.39 kg/m       Assessments   Osteoporosis: She is continue to be asymptomatic Had a baseline T score of -3.5 along with a family history of osteoporosis  She has had Reclast 5 times, with the last infusion done in 11/2018  In 01/2019 bone density had shown an improvement in her T score, with the result of -2.3  Vitamin D level is therapeutic Since she has had 5 treatments and her bone density is now continuing to be in the osteopenic range she will take 1 year drug holiday  Continue calcium supplements and vitamin D meanwhile  Hypothyroidism, post ablative with stable TSH on current regimen of 100 mcg generic of Synthroid No change with switching to generic and she is doing well clinically  She will be followed annually   Elayne Snare 11/26/2019, 8:35 AM

## 2019-11-26 ENCOUNTER — Other Ambulatory Visit: Payer: Self-pay

## 2019-11-26 ENCOUNTER — Encounter: Payer: Self-pay | Admitting: Endocrinology

## 2019-11-26 ENCOUNTER — Ambulatory Visit (INDEPENDENT_AMBULATORY_CARE_PROVIDER_SITE_OTHER): Payer: Medicare Other | Admitting: Endocrinology

## 2019-11-26 VITALS — BP 120/78 | HR 82 | Ht 62.75 in | Wt 125.4 lb

## 2019-11-26 DIAGNOSIS — E890 Postprocedural hypothyroidism: Secondary | ICD-10-CM | POA: Diagnosis not present

## 2019-11-26 DIAGNOSIS — M810 Age-related osteoporosis without current pathological fracture: Secondary | ICD-10-CM

## 2019-11-26 DIAGNOSIS — J3089 Other allergic rhinitis: Secondary | ICD-10-CM | POA: Diagnosis not present

## 2019-11-26 DIAGNOSIS — J3081 Allergic rhinitis due to animal (cat) (dog) hair and dander: Secondary | ICD-10-CM | POA: Diagnosis not present

## 2019-11-30 ENCOUNTER — Telehealth: Payer: Self-pay | Admitting: Family Medicine

## 2019-11-30 DIAGNOSIS — K219 Gastro-esophageal reflux disease without esophagitis: Secondary | ICD-10-CM

## 2019-11-30 MED ORDER — OMEPRAZOLE 40 MG PO CPDR: DELAYED_RELEASE_CAPSULE | ORAL | 0 refills | Status: DC

## 2019-11-30 NOTE — Telephone Encounter (Signed)
Refill sent in

## 2019-11-30 NOTE — Telephone Encounter (Signed)
Pt left voicemail and said she needs a new prescription for her omeprazole sent to the [facility] in [place] [address]. She said she has not used this medication in awhile but had a flare up of acid reflux yesterday

## 2019-12-06 DIAGNOSIS — J3089 Other allergic rhinitis: Secondary | ICD-10-CM | POA: Diagnosis not present

## 2019-12-06 DIAGNOSIS — L820 Inflamed seborrheic keratosis: Secondary | ICD-10-CM | POA: Diagnosis not present

## 2019-12-06 DIAGNOSIS — L821 Other seborrheic keratosis: Secondary | ICD-10-CM | POA: Diagnosis not present

## 2019-12-06 DIAGNOSIS — I788 Other diseases of capillaries: Secondary | ICD-10-CM | POA: Diagnosis not present

## 2019-12-10 DIAGNOSIS — J3089 Other allergic rhinitis: Secondary | ICD-10-CM | POA: Diagnosis not present

## 2019-12-13 DIAGNOSIS — J3081 Allergic rhinitis due to animal (cat) (dog) hair and dander: Secondary | ICD-10-CM | POA: Diagnosis not present

## 2019-12-13 DIAGNOSIS — J3089 Other allergic rhinitis: Secondary | ICD-10-CM | POA: Diagnosis not present

## 2019-12-17 ENCOUNTER — Other Ambulatory Visit: Payer: Self-pay | Admitting: Endocrinology

## 2019-12-20 DIAGNOSIS — J3089 Other allergic rhinitis: Secondary | ICD-10-CM | POA: Diagnosis not present

## 2020-01-06 DIAGNOSIS — N952 Postmenopausal atrophic vaginitis: Secondary | ICD-10-CM | POA: Diagnosis not present

## 2020-01-06 DIAGNOSIS — N3941 Urge incontinence: Secondary | ICD-10-CM | POA: Diagnosis not present

## 2020-01-06 NOTE — Telephone Encounter (Signed)
Called pt and left detailed voicemail informing her that she is now in need of new blood work so it is within 30 days of infusion. Requested call back to schedule new blood work.

## 2020-01-07 DIAGNOSIS — J3089 Other allergic rhinitis: Secondary | ICD-10-CM | POA: Diagnosis not present

## 2020-01-07 DIAGNOSIS — J301 Allergic rhinitis due to pollen: Secondary | ICD-10-CM | POA: Diagnosis not present

## 2020-01-07 DIAGNOSIS — J3081 Allergic rhinitis due to animal (cat) (dog) hair and dander: Secondary | ICD-10-CM | POA: Diagnosis not present

## 2020-01-12 DIAGNOSIS — J3089 Other allergic rhinitis: Secondary | ICD-10-CM | POA: Diagnosis not present

## 2020-01-12 DIAGNOSIS — J3081 Allergic rhinitis due to animal (cat) (dog) hair and dander: Secondary | ICD-10-CM | POA: Diagnosis not present

## 2020-01-21 DIAGNOSIS — J4530 Mild persistent asthma, uncomplicated: Secondary | ICD-10-CM | POA: Diagnosis not present

## 2020-01-21 DIAGNOSIS — H1045 Other chronic allergic conjunctivitis: Secondary | ICD-10-CM | POA: Diagnosis not present

## 2020-01-21 DIAGNOSIS — J3081 Allergic rhinitis due to animal (cat) (dog) hair and dander: Secondary | ICD-10-CM | POA: Diagnosis not present

## 2020-01-21 DIAGNOSIS — J3089 Other allergic rhinitis: Secondary | ICD-10-CM | POA: Diagnosis not present

## 2020-01-24 NOTE — Telephone Encounter (Signed)
A letter has been sent to the pt informing her that she is now overdue for her Reclast infusion. Requested that the patient call this office and schedule upon receiving letter.

## 2020-01-25 DIAGNOSIS — J3089 Other allergic rhinitis: Secondary | ICD-10-CM | POA: Diagnosis not present

## 2020-02-10 DIAGNOSIS — J3089 Other allergic rhinitis: Secondary | ICD-10-CM | POA: Diagnosis not present

## 2020-02-10 DIAGNOSIS — J301 Allergic rhinitis due to pollen: Secondary | ICD-10-CM | POA: Diagnosis not present

## 2020-02-10 DIAGNOSIS — J3081 Allergic rhinitis due to animal (cat) (dog) hair and dander: Secondary | ICD-10-CM | POA: Diagnosis not present

## 2020-02-21 ENCOUNTER — Telehealth: Payer: Self-pay | Admitting: Endocrinology

## 2020-02-21 NOTE — Telephone Encounter (Signed)
Patient called to advised that per Dr Dwyane Dee.  We do not need to attempt to schedule at this time.

## 2020-02-22 DIAGNOSIS — J3089 Other allergic rhinitis: Secondary | ICD-10-CM | POA: Diagnosis not present

## 2020-02-24 ENCOUNTER — Other Ambulatory Visit: Payer: Self-pay | Admitting: *Deleted

## 2020-02-24 DIAGNOSIS — K219 Gastro-esophageal reflux disease without esophagitis: Secondary | ICD-10-CM

## 2020-02-24 MED ORDER — OMEPRAZOLE 40 MG PO CPDR: DELAYED_RELEASE_CAPSULE | ORAL | 0 refills | Status: DC

## 2020-03-15 DIAGNOSIS — J3089 Other allergic rhinitis: Secondary | ICD-10-CM | POA: Diagnosis not present

## 2020-03-24 DIAGNOSIS — J3089 Other allergic rhinitis: Secondary | ICD-10-CM | POA: Diagnosis not present

## 2020-04-03 DIAGNOSIS — J3089 Other allergic rhinitis: Secondary | ICD-10-CM | POA: Diagnosis not present

## 2020-04-08 ENCOUNTER — Other Ambulatory Visit: Payer: Self-pay | Admitting: Family Medicine

## 2020-04-08 DIAGNOSIS — K219 Gastro-esophageal reflux disease without esophagitis: Secondary | ICD-10-CM

## 2020-04-10 DIAGNOSIS — J3089 Other allergic rhinitis: Secondary | ICD-10-CM | POA: Diagnosis not present

## 2020-04-13 DIAGNOSIS — Z23 Encounter for immunization: Secondary | ICD-10-CM | POA: Diagnosis not present

## 2020-04-18 DIAGNOSIS — J3089 Other allergic rhinitis: Secondary | ICD-10-CM | POA: Diagnosis not present

## 2020-04-20 ENCOUNTER — Telehealth: Payer: Self-pay

## 2020-04-20 DIAGNOSIS — J3089 Other allergic rhinitis: Secondary | ICD-10-CM | POA: Diagnosis not present

## 2020-04-20 NOTE — Telephone Encounter (Signed)
Called patient, LVM to call back to discuss if still interested in doing the reclast infusion.   Left call back number.

## 2020-04-24 DIAGNOSIS — J301 Allergic rhinitis due to pollen: Secondary | ICD-10-CM | POA: Diagnosis not present

## 2020-04-24 DIAGNOSIS — J3089 Other allergic rhinitis: Secondary | ICD-10-CM | POA: Diagnosis not present

## 2020-05-02 DIAGNOSIS — Z1159 Encounter for screening for other viral diseases: Secondary | ICD-10-CM | POA: Diagnosis not present

## 2020-05-17 DIAGNOSIS — J3089 Other allergic rhinitis: Secondary | ICD-10-CM | POA: Diagnosis not present

## 2020-05-17 DIAGNOSIS — J301 Allergic rhinitis due to pollen: Secondary | ICD-10-CM | POA: Diagnosis not present

## 2020-06-01 DIAGNOSIS — J3089 Other allergic rhinitis: Secondary | ICD-10-CM | POA: Diagnosis not present

## 2020-06-08 DIAGNOSIS — J301 Allergic rhinitis due to pollen: Secondary | ICD-10-CM | POA: Diagnosis not present

## 2020-06-08 DIAGNOSIS — J3089 Other allergic rhinitis: Secondary | ICD-10-CM | POA: Diagnosis not present

## 2020-06-12 DIAGNOSIS — L84 Corns and callosities: Secondary | ICD-10-CM | POA: Diagnosis not present

## 2020-06-12 DIAGNOSIS — D225 Melanocytic nevi of trunk: Secondary | ICD-10-CM | POA: Diagnosis not present

## 2020-06-12 DIAGNOSIS — L820 Inflamed seborrheic keratosis: Secondary | ICD-10-CM | POA: Diagnosis not present

## 2020-06-14 DIAGNOSIS — N952 Postmenopausal atrophic vaginitis: Secondary | ICD-10-CM | POA: Diagnosis not present

## 2020-06-14 DIAGNOSIS — J3089 Other allergic rhinitis: Secondary | ICD-10-CM | POA: Diagnosis not present

## 2020-06-14 DIAGNOSIS — N3941 Urge incontinence: Secondary | ICD-10-CM | POA: Diagnosis not present

## 2020-06-19 ENCOUNTER — Other Ambulatory Visit: Payer: Self-pay | Admitting: *Deleted

## 2020-06-19 DIAGNOSIS — J3089 Other allergic rhinitis: Secondary | ICD-10-CM | POA: Diagnosis not present

## 2020-06-19 DIAGNOSIS — J301 Allergic rhinitis due to pollen: Secondary | ICD-10-CM | POA: Diagnosis not present

## 2020-06-19 MED ORDER — LEVOTHYROXINE SODIUM 100 MCG PO TABS: ORAL_TABLET | ORAL | 1 refills | Status: DC

## 2020-06-27 DIAGNOSIS — J3089 Other allergic rhinitis: Secondary | ICD-10-CM | POA: Diagnosis not present

## 2020-06-29 DIAGNOSIS — R350 Frequency of micturition: Secondary | ICD-10-CM | POA: Diagnosis not present

## 2020-06-29 DIAGNOSIS — N3941 Urge incontinence: Secondary | ICD-10-CM | POA: Diagnosis not present

## 2020-07-06 DIAGNOSIS — R350 Frequency of micturition: Secondary | ICD-10-CM | POA: Diagnosis not present

## 2020-07-06 DIAGNOSIS — R3915 Urgency of urination: Secondary | ICD-10-CM | POA: Diagnosis not present

## 2020-07-06 DIAGNOSIS — N3941 Urge incontinence: Secondary | ICD-10-CM | POA: Diagnosis not present

## 2020-07-10 DIAGNOSIS — J3089 Other allergic rhinitis: Secondary | ICD-10-CM | POA: Diagnosis not present

## 2020-07-13 DIAGNOSIS — N3941 Urge incontinence: Secondary | ICD-10-CM | POA: Diagnosis not present

## 2020-07-13 DIAGNOSIS — R350 Frequency of micturition: Secondary | ICD-10-CM | POA: Diagnosis not present

## 2020-07-17 DIAGNOSIS — J3089 Other allergic rhinitis: Secondary | ICD-10-CM | POA: Diagnosis not present

## 2020-07-20 DIAGNOSIS — R350 Frequency of micturition: Secondary | ICD-10-CM | POA: Diagnosis not present

## 2020-07-20 DIAGNOSIS — N3941 Urge incontinence: Secondary | ICD-10-CM | POA: Diagnosis not present

## 2020-07-24 DIAGNOSIS — J3089 Other allergic rhinitis: Secondary | ICD-10-CM | POA: Diagnosis not present

## 2020-07-27 DIAGNOSIS — R3915 Urgency of urination: Secondary | ICD-10-CM | POA: Diagnosis not present

## 2020-07-27 DIAGNOSIS — N3941 Urge incontinence: Secondary | ICD-10-CM | POA: Diagnosis not present

## 2020-07-27 DIAGNOSIS — R350 Frequency of micturition: Secondary | ICD-10-CM | POA: Diagnosis not present

## 2020-07-31 DIAGNOSIS — J3089 Other allergic rhinitis: Secondary | ICD-10-CM | POA: Diagnosis not present

## 2020-08-03 DIAGNOSIS — N3941 Urge incontinence: Secondary | ICD-10-CM | POA: Diagnosis not present

## 2020-08-10 DIAGNOSIS — J3089 Other allergic rhinitis: Secondary | ICD-10-CM | POA: Diagnosis not present

## 2020-08-10 DIAGNOSIS — R350 Frequency of micturition: Secondary | ICD-10-CM | POA: Diagnosis not present

## 2020-08-17 DIAGNOSIS — N3941 Urge incontinence: Secondary | ICD-10-CM | POA: Diagnosis not present

## 2020-08-17 DIAGNOSIS — J3089 Other allergic rhinitis: Secondary | ICD-10-CM | POA: Diagnosis not present

## 2020-08-23 DIAGNOSIS — J3089 Other allergic rhinitis: Secondary | ICD-10-CM | POA: Diagnosis not present

## 2020-08-24 DIAGNOSIS — N3941 Urge incontinence: Secondary | ICD-10-CM | POA: Diagnosis not present

## 2020-08-30 DIAGNOSIS — J301 Allergic rhinitis due to pollen: Secondary | ICD-10-CM | POA: Diagnosis not present

## 2020-08-30 DIAGNOSIS — J3081 Allergic rhinitis due to animal (cat) (dog) hair and dander: Secondary | ICD-10-CM | POA: Diagnosis not present

## 2020-08-30 DIAGNOSIS — J3089 Other allergic rhinitis: Secondary | ICD-10-CM | POA: Diagnosis not present

## 2020-08-31 DIAGNOSIS — N3941 Urge incontinence: Secondary | ICD-10-CM | POA: Diagnosis not present

## 2020-09-07 DIAGNOSIS — N3941 Urge incontinence: Secondary | ICD-10-CM | POA: Diagnosis not present

## 2020-09-07 DIAGNOSIS — J3089 Other allergic rhinitis: Secondary | ICD-10-CM | POA: Diagnosis not present

## 2020-09-07 DIAGNOSIS — R350 Frequency of micturition: Secondary | ICD-10-CM | POA: Diagnosis not present

## 2020-09-07 DIAGNOSIS — J301 Allergic rhinitis due to pollen: Secondary | ICD-10-CM | POA: Diagnosis not present

## 2020-09-12 DIAGNOSIS — L720 Epidermal cyst: Secondary | ICD-10-CM | POA: Diagnosis not present

## 2020-09-12 DIAGNOSIS — L821 Other seborrheic keratosis: Secondary | ICD-10-CM | POA: Diagnosis not present

## 2020-09-12 DIAGNOSIS — L820 Inflamed seborrheic keratosis: Secondary | ICD-10-CM | POA: Diagnosis not present

## 2020-09-12 NOTE — Patient Instructions (Addendum)
  HEALTH MAINTENANCE RECOMMENDATIONS:  It is recommended that you get at least 30 minutes of aerobic exercise at least 5 days/week (for weight loss, you may need as much as 60-90 minutes). This can be any activity that gets your heart rate up. This can be divided in 10-15 minute intervals if needed, but try and build up your endurance at least once a week.  Weight bearing exercise is also recommended twice weekly.  Eat a healthy diet with lots of vegetables, fruits and fiber.  "Colorful" foods have a lot of vitamins (ie green vegetables, tomatoes, red peppers, etc).  Limit sweet tea, regular sodas and alcoholic beverages, all of which has a lot of calories and sugar.  Up to 1 alcoholic drink daily may be beneficial for women (unless trying to lose weight, watch sugars).  Drink a lot of water.  Calcium recommendations are 1200-1500 mg daily (1500 mg for postmenopausal women or women without ovaries), and vitamin D 1000 IU daily.  This should be obtained from diet and/or supplements (vitamins), and calcium should not be taken all at once, but in divided doses.  Monthly self breast exams and yearly mammograms for women over the age of 55 is recommended.  Sunscreen of at least SPF 30 should be used on all sun-exposed parts of the skin when outside between the hours of 10 am and 4 pm (not just when at beach or pool, but even with exercise, golf, tennis, and yard work!)  Use a sunscreen that says "broad spectrum" so it covers both UVA and UVB rays, and make sure to reapply every 1-2 hours.  Remember to change the batteries in your smoke detectors when changing your clock times in the spring and fall. Carbon monoxide detectors are recommended for your home.  Use your seat belt every time you are in a car, and please drive safely and not be distracted with cell phones and texting while driving.   Theresa Schneider , Thank you for taking time to come for your Medicare Wellness Visit. I appreciate your ongoing  commitment to your health goals. Please review the following plan we discussed and let me know if I can assist you in the future.    This is a list of the screening recommended for you and due dates:  Health Maintenance  Topic Date Due  . Pneumonia vaccines (2 of 2 - PCV13) 09/12/2020  . Flu Shot  12/04/2020  . Mammogram  10/27/2020  . Colon Cancer Screening  04/14/2022  . Tetanus Vaccine  11/22/2023  . DEXA scan (bone density measurement)  Completed  . COVID-19 Vaccine  Completed  . Hepatitis C Screening: USPSTF Recommendation to screen - Ages 40-79 yo.  Completed  . HPV Vaccine  Aged Out   COVID booster is recommended. You need to wait 2 weeks from today's vaccine.  You can decide between [employer] or Moderna and schedule a nurse visit vs get at a pharmacy.  [id number] was given today.  I recommend getting the new shingles vaccine (Shingrix). Since you have Medicare, you will need to get this from the pharmacy, as it is covered by Part D. This is a series of 2 injections, spaced 2 months apart. All vaccines should be separated from each other (Prevnar, COVID booster, Shingrix) by at least 2 weeks.  I encourage you to consider finding a new therapist that takes your insurance.

## 2020-09-12 NOTE — Progress Notes (Signed)
Chief Complaint  Patient presents with  . Medicare Wellness    Fasting AWV with pap. Increased stress due to husband's health and her parents and her husband's mother and her health. No other concerns. Is doing PTNS for overactive bladder, tomorrow is last treatment. Also states she uses her premarin cream but still has vaginal dryness issues. Has not gotten Shingrix vaccine yet-wanted to wait to meet deductible. Had a bad reaction to 2nd covid vaccine and is hesitant to getting any boosters at all-has photo to show. Will discuss prevnar 20 with you today.    Theresa Schneider is a 65 y.o. female who presents for Medicare Wellness visit, and follow-up on chronic medical conditions.    L AC joint impingement and L wrist pain were bothersome to her last year.  She reports that her shoulder pain improved since she started taking Turmeric.  She has occasional L wrist pain, relieved by icing prn.  Last year we discussed her anxiety, worrying a lot about her elderly parents.  She used to get regular therapy, which helped.  She had cut back on the frequency related to cost/coverage, and now has stopped completely related to cost. Husband has throat cancer, had aspiration pneumonia twice in the last year. Getting swallowing therapy.  This occurred while on vacation, and she is afraid to go on vacation with him again (has one scheduled for next month). Still stressors related to her elderly parents and her mother-in-law (how is now in a nursing home). She has some trouble sleeping, but melatonin helps.  Cystocele and OAB:  Under the care of Dr. Gilford Rile at [facility], last seen 06/2020.  She was given a trial of Myrbetriq in 01/2020, it made her feel anxious, pulse was faster. Found it did help her bladder . She took it only for a month. She got HA's from ditropan and Vesicare as well, as well as dry eyes. +Stress incontinence with sneezing/laughing, not every time, if bladder is full. She has been getting PTNS for  OAB, weekly x 3 months (tomorrow is last day).  She feels like it is better. She still has some urgency, sometimes has leakage, improved if voiding more regularly (not waiting too long).   She continues to use topical premarin cream, using peri-urethrally only once a week, and is complaining of vaginal dryness.   GERD: This is overall improved.  Head of bed is elevated at night. She uses 71m of omeprazole only when laying flat (traveling, babysitting and sleeping in other beds).  She uses Tums prn.  Hasn't tried 258momeprazole (because still has 4049m  She has been eating earlier since retiring, rarely eats after 3pm, never after 5pm. She had some reflux last night--had been bending over a lot (moving plants) a few hours after eating.  Didn't treat the symptoms, no issues at night.  Denies dysphagia.   She has h/o vitamin D deficiency, monitored by Dr. KumDwyane Deehe is currently taking 5000 IU of D3 several times/week (previously took 1000 IU daily--she increased the dose to see if it helps with her energy--energy isn't as good since she had COVID), and Ca+D once daily. Last level was 46 in 11/2019.  Osteoporosis--previously treated with Actonel x 2 years in the distant past, also took Prolia (twice, changed due to cost). She got yearly Reclast infusions 10/2013-11/2018 through Dr. KumDwyane Deeast DEXA was 01/2019. T-2.3 spine, -1.8 L fem neck (improved from last check-- 04/2016, -2.4 at the spine, -2.0 at left femoral neck.). He recommended  a drug holiday at 11/2019 visit.  She is scheduled for 11/2020. She is compliant with her Calcium and D (see above).  Walks, no other weight-bearing exercise.  H/o Grave's disease, s/p RAI treatment, with resultant hypothyroidism. Under the care of Dr. Dwyane Dee. She denies thyroid symptoms. Scheduled to see him in July. Lab Results  Component Value Date   TSH 2.06 11/23/2019   Allergies: On immunotherapy, under the care of Dr. Donneta Romberg. She is on Flonase and montelukast.  She stopped the cat allergy shots, may stop the others this summer. She has been taking zyrtec every night (prev didn't, related to dry eyes); still has some congestion, feels like her ears could pop.  Immunization History  Administered Date(s) Administered  . DTaP 10/05/2006  . Influenza,inj,Quad PF,6+ Mos 03/10/2018  . Influenza-Unspecified 02/19/2017  . Moderna Sars-Covid-2 Vaccination 05/18/2019, 06/21/2019  . Pneumococcal Polysaccharide-23 09/13/2019  . Tdap 11/21/2013   Waiting until deductible met before getting Shingrix from pharmacy Had her flu shot at pharmacy this year [facility] Oppen[facility]) Last Pap smear:07/2016, normal with no high risk HPV Last mammogram:10/2019 Last colonoscopy:04/2012 Dr. Bronson Ing, repeat 10 years Last DEXA: 01/2019. T-2.3 spine, -1.8 L fem neck  Dentist:twice yearly Ophtho:yearly, wears glasses. Exercise: Walks 4-5x/week (45 mins--and is walking faster and further than last year), no weight-bearing exercise. Lipid screen: Lab Results  Component Value Date   CHOL 238 (H) 09/13/2019   HDL 68 09/13/2019   LDLCALC 150 (H) 09/13/2019   TRIG 116 09/13/2019   CHOLHDL 3.5 09/13/2019   Other doctors caring for patient include: Allergist: Dr. Donneta Romberg Ophtho: Dr. Gershon Crane Endocrinologist: Dr. Dwyane Dee Urologist: Dr. Lovena Neighbours Dentist: Dr. Loma Newton Derm: Dr. Jarome Matin Ortho: Dr. Noemi Chapel (not seen in a while) GI: Dr. Olevia Perches (retired). Holistic med: Dr. Bettey Costa (in [place])  Depression screen:  Negative Fall screen: negative Functional Status survey: unremarkable, see full screen Mini-Cog screen: normal See epic for full screens  End of Life Discussion:  Patient does not have a living will and medical power of attorney. Given paperwork last year, unsure if she still has it.  New set given today.   PMH, PSH, SH and FH were reviewed and updated.  Outpatient Encounter Medications as of 09/13/2020  Medication Sig Note  . Ascorbic Acid (VITAMIN  C PO) Take 5,000 mg by mouth daily.   . Calcium-Magnesium-Vitamin D (CALCIUM 500 PO) Take 1 capsule by mouth 2 (two) times daily.  09/13/2020: Taking once daily  . Cetirizine HCl (ZYRTEC PO) Take by mouth.   . cholecalciferol (VITAMIN D) 1000 units tablet Take 1,000 Units by mouth daily. 09/13/2020: Taking 5000 IU a few times/week  . co-enzyme Q-10 30 MG capsule Take 30 mg by mouth 3 (three) times daily.   . fluticasone (FLONASE) 50 MCG/ACT nasal spray Place 1 spray into both nostrils daily.    Marland Kitchen levothyroxine (SYNTHROID) 100 MCG tablet TAKE ONE TABLET BY MOUTH DAILY BEFORE BREAKFAST 09/13/2020: Taking 79mg (changed about a year ago by Dr. KDwyane Dee . Magnesium 500 MG CAPS Take 1 capsule by mouth daily.   .Marland KitchenMELATONIN PO Take 1 capsule by mouth daily.   . montelukast (SINGULAIR) 10 MG tablet Take 10 mg by mouth daily. 09/13/2019: Doesn't always take every day  . omeprazole (PRILOSEC) 40 MG capsule TAKE ONE CAPSULE BY MOUTH DAILY BEFORE DINNER   . PREMARIN vaginal cream  09/13/2020: Only using it once a week, peri-urethrally only   . PRESCRIPTION MEDICATION Inject 1 application into the muscle every 7 (seven) days.  Allergy shots weekly   . TURMERIC PO Take 1 capsule by mouth daily.   Marland Kitchen zinc gluconate 50 MG tablet Take 50 mg by mouth daily.   . Azelastine HCl 0.15 % SOLN  (Patient not taking: Reported on 09/13/2020)   . PROAIR HFA 108 (90 Base) MCG/ACT inhaler INHALE 1 2 PUFFS AS NEEDED EVERY 4 6 HRS AS NEEDED FOR COUGH, WHEEZE AND SHORTNESS OF BREATH (Patient not taking: No sig reported) 09/13/2020: Uses prn, usually just when ill  . Zinc Gluconate (COLD-EEZE PO) Take 1 tablet by mouth as needed. (Patient not taking: Reported on 09/13/2020)   . zoledronic acid (RECLAST) 5 MG/100ML SOLN injection Inject 100 mLs (5 mg total) into the vein once. (Patient not taking: Reported on 09/13/2020)   . [DISCONTINUED] diphenhydrAMINE (SOMINEX) 25 MG tablet Take 25 mg by mouth at bedtime as needed for sleep. (Patient not  taking: Reported on 09/13/2020)   . [DISCONTINUED] oxybutynin (DITROPAN-XL) 10 MG 24 hr tablet Take 10 mg by mouth at bedtime.    No facility-administered encounter medications on file as of 09/13/2020.   Allergies  Allergen Reactions  . Codeine Phosphate Nausea And Vomiting  . Latex Itching    senitivity    ROS:The patient denies anorexia, fever, significant weight changes,vision changes, decreased hearing, ear pain, sore throat, breast concerns, chest pain, palpitations, dizziness, syncope, dyspnea on exertion, cough, swelling, nausea, vomiting, diarrhea, constipation, abdominal pain, melena, hematochezia, hematuria, dysuria, vaginal bleeding,discharge, odor or itch, genital lesions, numbness, tingling, weakness, tremor, suspicious skin lesions, depression, abnormal bleeding/bruising, or enlarged lymph nodes. +anxiety and intermittent trouble sleeping (sleep improved with melatonin, still +stress). Headaches are infrequent, probably from stress (no longer on ditropan). Vaginal dryness. No hot flashes or night sweats. Some hip pain if her head is propped up too much when sleeping.  Uses Tylenol prn. L shoulder pain resolved, and thumb/wrist pain is mild, intermittent not always needing medication. Chronic congestion/allergies, overall controlled. Heartburn infrequently, r/b Tums prn. Denies dysphagia Leakageof urinewith sneeze/laughing. Some urinary urgency.   PHYSICAL EXAM:  BP 130/80   Pulse 72   Ht 5' 3" (1.6 m)   Wt 128 lb (58.1 kg)   LMP 01/04/2005   BMI 22.67 kg/m   Wt Readings from Last 3 Encounters:  09/13/20 128 lb (58.1 kg)  11/26/19 125 lb 6.4 oz (56.9 kg)  09/13/19 129 lb (58.5 kg)    General Appearance:  Alert, cooperative, talkative female inno distress, appears stated age  Head:  Normocephalic, without obvious abnormality, atraumatic  Eyes:  PERRL, conjunctiva/corneas clear, EOM's intact, fundi benign  Ears:  Normal TM's and external ear canals   Nose: Not examined, wearing mask due to COVID-19 pandemic  Throat: Not examined, wearing mask due to COVID-19 pandemic  Neck: Supple, no lymphadenopathy; thyroid: no enlargement/tenderness/ nodules; no carotid bruit or JVD  Back:  Spine nontender, no curvature, ROM normal, no CVAtenderness  Lungs:  Clear to auscultation bilaterally without wheezes, rales orronchi; respirations unlabored  Chest Wall:  No tenderness or deformity.   Heart:  Regular rate and rhythm, S1 and S2 normal, no murmur, rub or gallop  Breast Exam:  No nipple discharge or inversion.  No skin dimpling breast masses or tenderness. No axillary lymphadenopathy  Abdomen:  Soft, non-tender, nondistended, normoactive bowel sounds,  no masses, no hepatosplenomegaly  Genitalia:  Normal external genitalia without lesions, atrophic changes noted. Only small cystocele noted today.BUS and vagina normal;no cervicalmotion tenderness. No abnormal vaginal discharge. Uterus and adnexa not enlarged, nontender, no  masses. Pap not performed  Rectal:  Normal tone, no masses or tenderness; heme negative stool  Extremities: No clubbing, cyanosis or edema  Pulses: 2+ and symmetric all extremities  Skin: Skin color, texture, turgor normal, no rashes. Large blood blister with central scab between breasts from recent cryotherapy. Bandages on back, R chest  Lymph nodes: Cervical, supraclavicular nodes normal  Neurologic: Normal strength, sensation and gait; reflexes 2+ and symmetric throughout  Psych: Normal mood, affect, hygiene and grooming.  ASSESSMENT/PLAN:    Discussed monthly self breast exams and yearly mammograms; at least 30 minutes of aerobic activity at least 5 days/week, weight-bearing exercise at least 2x/week; proper sunscreen use reviewed; healthy diet, including goals of calcium and vitamin D intake and alcohol recommendations (less than or equal to 1  drink/day) reviewed; regular seatbelt use; changing batteries in smoke detectors, carbon monoxide detectors. Immunization recommendations discussed--continue yearly flu shots. Shingrix recommended, to get from pharmacy. Risks/SE reviewed. [id number] recommended, given today. COVID booster recommended, to wait 2 weeks from today's vaccine.  She is hesitant due to prior SE. Advised she can mix and match if she prefers. Colonoscopy recommendations reviewed, UTD, due 04/2022. Pap next year  MOST form reviewed/signed, full code, full care She was given another set of paperwork for Living Will and [place], encouraged to get [place] copies when completed and notarized  F/u 1 year, sooner prn   Medicare Attestation I have personally reviewed: The patient's medical and social history Their use of alcohol, tobacco or illicit drugs Their current medications and supplements The patient's functional ability including ADLs,fall risks, home safety risks, cognitive, and hearing and visual impairment Diet and physical activities Evidence for depression or mood disorders  The patient's weight, height, BMI have been recorded in the chart.  I have made referrals, counseling, and provided education to the patient based on review of the above and I have provided the patient with a written personalized care plan for preventive services.

## 2020-09-13 ENCOUNTER — Encounter: Payer: Self-pay | Admitting: Family Medicine

## 2020-09-13 ENCOUNTER — Ambulatory Visit (INDEPENDENT_AMBULATORY_CARE_PROVIDER_SITE_OTHER): Payer: Medicare Other | Admitting: Family Medicine

## 2020-09-13 VITALS — BP 130/80 | HR 72 | Ht 63.0 in | Wt 128.0 lb

## 2020-09-13 DIAGNOSIS — M818 Other osteoporosis without current pathological fracture: Secondary | ICD-10-CM | POA: Diagnosis not present

## 2020-09-13 DIAGNOSIS — Z23 Encounter for immunization: Secondary | ICD-10-CM | POA: Diagnosis not present

## 2020-09-13 DIAGNOSIS — J3089 Other allergic rhinitis: Secondary | ICD-10-CM | POA: Diagnosis not present

## 2020-09-13 DIAGNOSIS — Z0000 Encounter for general adult medical examination without abnormal findings: Secondary | ICD-10-CM

## 2020-09-13 DIAGNOSIS — R5383 Other fatigue: Secondary | ICD-10-CM

## 2020-09-13 DIAGNOSIS — E7800 Pure hypercholesterolemia, unspecified: Secondary | ICD-10-CM

## 2020-09-13 DIAGNOSIS — N3281 Overactive bladder: Secondary | ICD-10-CM

## 2020-09-13 DIAGNOSIS — K219 Gastro-esophageal reflux disease without esophagitis: Secondary | ICD-10-CM

## 2020-09-13 DIAGNOSIS — E890 Postprocedural hypothyroidism: Secondary | ICD-10-CM | POA: Diagnosis not present

## 2020-09-13 DIAGNOSIS — Z01411 Encounter for gynecological examination (general) (routine) with abnormal findings: Secondary | ICD-10-CM

## 2020-09-14 DIAGNOSIS — J301 Allergic rhinitis due to pollen: Secondary | ICD-10-CM | POA: Diagnosis not present

## 2020-09-14 DIAGNOSIS — N3941 Urge incontinence: Secondary | ICD-10-CM | POA: Diagnosis not present

## 2020-09-14 DIAGNOSIS — J3089 Other allergic rhinitis: Secondary | ICD-10-CM | POA: Diagnosis not present

## 2020-09-14 LAB — CBC WITH DIFFERENTIAL/PLATELET
Basophils Absolute: 0.1 10*3/uL (ref 0.0–0.2)
Basos: 1 %
EOS (ABSOLUTE): 0.1 10*3/uL (ref 0.0–0.4)
Eos: 1 %
Hematocrit: 43.8 % (ref 34.0–46.6)
Hemoglobin: 14 g/dL (ref 11.1–15.9)
Immature Grans (Abs): 0 10*3/uL (ref 0.0–0.1)
Immature Granulocytes: 0 %
Lymphocytes Absolute: 2.8 10*3/uL (ref 0.7–3.1)
Lymphs: 31 %
MCH: 28.5 pg (ref 26.6–33.0)
MCHC: 32 g/dL (ref 31.5–35.7)
MCV: 89 fL (ref 79–97)
Monocytes Absolute: 0.9 10*3/uL (ref 0.1–0.9)
Monocytes: 10 %
Neutrophils Absolute: 5.1 10*3/uL (ref 1.4–7.0)
Neutrophils: 57 %
Platelets: 412 10*3/uL (ref 150–450)
RBC: 4.91 x10E6/uL (ref 3.77–5.28)
RDW: 12.9 % (ref 11.7–15.4)
WBC: 8.9 10*3/uL (ref 3.4–10.8)

## 2020-09-14 LAB — LIPID PANEL
Chol/HDL Ratio: 3.2 ratio (ref 0.0–4.4)
Cholesterol, Total: 243 mg/dL — ABNORMAL HIGH (ref 100–199)
HDL: 76 mg/dL (ref >39)
LDL Chol Calc (NIH): 153 mg/dL — ABNORMAL HIGH (ref 0–99)
Triglycerides: 85 mg/dL (ref 0–149)
VLDL Cholesterol Cal: 14 mg/dL (ref 5–40)

## 2020-09-20 DIAGNOSIS — J3089 Other allergic rhinitis: Secondary | ICD-10-CM | POA: Diagnosis not present

## 2020-10-11 ENCOUNTER — Encounter: Payer: Self-pay | Admitting: Family Medicine

## 2020-10-12 ENCOUNTER — Encounter: Payer: Self-pay | Admitting: Family Medicine

## 2020-10-12 ENCOUNTER — Telehealth (INDEPENDENT_AMBULATORY_CARE_PROVIDER_SITE_OTHER): Payer: Medicare Other | Admitting: Family Medicine

## 2020-10-12 VITALS — BP 103/72 | HR 88 | Temp 97.2°F | Ht 63.0 in | Wt 126.8 lb

## 2020-10-12 DIAGNOSIS — H2513 Age-related nuclear cataract, bilateral: Secondary | ICD-10-CM | POA: Diagnosis not present

## 2020-10-12 DIAGNOSIS — F411 Generalized anxiety disorder: Secondary | ICD-10-CM | POA: Diagnosis not present

## 2020-10-12 DIAGNOSIS — R350 Frequency of micturition: Secondary | ICD-10-CM | POA: Diagnosis not present

## 2020-10-12 DIAGNOSIS — N3941 Urge incontinence: Secondary | ICD-10-CM | POA: Diagnosis not present

## 2020-10-12 DIAGNOSIS — R3915 Urgency of urination: Secondary | ICD-10-CM | POA: Diagnosis not present

## 2020-10-12 MED ORDER — ESCITALOPRAM OXALATE 10 MG PO TABS: ORAL_TABLET | ORAL | 1 refills | Status: DC

## 2020-10-12 MED ORDER — ALPRAZOLAM 0.25 MG PO TABS: 0.2500 mg | ORAL_TABLET | Freq: Three times a day (TID) | ORAL | 0 refills | Status: DC | PRN

## 2020-10-12 NOTE — Progress Notes (Signed)
Start time: 2:50 End time: 3:31  Virtual Visit via Video Note  I connected with Theresa Schneider on 10/12/20 by a video enabled telemedicine application and verified that I am speaking with the correct person using two identifiers.  Location: Patient: home Provider: office   I discussed the limitations of evaluation and management by telemedicine and the availability of in person appointments. The patient expressed understanding and agreed to proceed.  History of Present Illness:  Chief Complaint  Patient presents with   Anxiety    VIRTUAL discuss starting a medication for anxiety.   Patient presents requesting start of anxiety medication.  We have previously discussed her anxiety and ongoing stressors.   She and her husband took a recent vacation, and her husband ended up in hospital (dehydrated, Na 121). She was nervous the whole time, was worried about him eating/drinking. They are now home, and daughter came to help.   She said they are barking at each other (pt and husband), needs to get on anxiety meds. Husband saw [facility] today for follow-up.  They feel she isn't patient, understanding.  She feels they don't understand what it is like for her. She gets angry when he isn't compliant. "I'm too tired to be angry, now I'm just broken-hearted" CBD oil (oral) helps her sleep, Melatonin also helps.  She reports she "feels like a rubber band" (feels stretched thin), admits she isn't very understanding. Anticipates things, worries a lot  About 20 years ago took effexor--didn't feel like herself, felt too sedated.  PMH, PSH, SH reviewed  Outpatient Encounter Medications as of 10/12/2020  Medication Sig Note   Ascorbic Acid (VITAMIN C PO) Take 5,000 mg by mouth daily.    Azelastine HCl 0.15 % SOLN     Calcium-Magnesium-Vitamin D (CALCIUM 500 PO) Take 1 capsule by mouth 2 (two) times daily.  09/13/2020: Taking once daily   Cetirizine HCl (ZYRTEC PO) Take by mouth.    cholecalciferol  (VITAMIN D) 1000 units tablet Take 1,000 Units by mouth daily. 09/13/2020: Taking 5000 IU a few times/week   co-enzyme Q-10 30 MG capsule Take 30 mg by mouth 3 (three) times daily.    fluticasone (FLONASE) 50 MCG/ACT nasal spray Place 1 spray into both nostrils daily.     levothyroxine (SYNTHROID) 88 MCG tablet Take 88 mcg by mouth daily before breakfast.    Magnesium 500 MG CAPS Take 1 capsule by mouth daily.    montelukast (SINGULAIR) 10 MG tablet Take 10 mg by mouth daily. 09/13/2019: Doesn't always take every day   NON FORMULARY Take 4-6 drops by mouth 2 (two) times daily. 10/12/2020: CBD OIL    PRESCRIPTION MEDICATION Inject 1 application into the muscle every 7 (seven) days. Allergy shots weekly    TURMERIC PO Take 1 capsule by mouth daily.    zinc gluconate 50 MG tablet Take 50 mg by mouth daily.    MELATONIN PO Take 1 capsule by mouth daily. (Patient not taking: Reported on 10/12/2020)    omeprazole (PRILOSEC) 40 MG capsule TAKE ONE CAPSULE BY MOUTH DAILY BEFORE DINNER (Patient not taking: Reported on 10/12/2020)    PREMARIN vaginal cream  (Patient not taking: Reported on 10/12/2020) 09/13/2020: Only using it once a week, peri-urethrally only    PROAIR HFA 108 (90 Base) MCG/ACT inhaler INHALE 1 2 PUFFS AS NEEDED EVERY 4 6 HRS AS NEEDED FOR COUGH, WHEEZE AND SHORTNESS OF BREATH (Patient not taking: No sig reported) 09/13/2020: Uses prn, usually just when ill  zoledronic acid (RECLAST) 5 MG/100ML SOLN injection Inject 100 mLs (5 mg total) into the vein once. (Patient not taking: No sig reported)    [DISCONTINUED] levothyroxine (SYNTHROID) 100 MCG tablet TAKE ONE TABLET BY MOUTH DAILY BEFORE BREAKFAST 09/13/2020: Taking 29mcg (changed about a year ago by Dr. Dwyane Dee   [DISCONTINUED] Zinc Gluconate (COLD-EEZE PO) Take 1 tablet by mouth as needed. (Patient not taking: Reported on 09/13/2020)    No facility-administered encounter medications on file as of 10/12/2020.   Allergies  Allergen Reactions    Codeine Phosphate Nausea And Vomiting   Latex Itching    senitivity    ROS: no fever, chills, URI symptoms.  Anxiety, irritability per HPI.  Insomnia relieved by OTC meds (CBD, melatonin).  No other concerns, except as noted in HPI.    Observations/Objective:  BP 103/72   Pulse 88   Temp (!) 97.2 F (36.2 C) (Temporal)   Ht 5\' 3"  (1.6 m)   Wt 126 lb 12.8 oz (57.5 kg)   LMP 01/04/2005   BMI 22.46 kg/m   Pleasant female, in no distress. She is alert, oriented.   She has normal eye contact, speech, hygiene and grooming. She has anxious mood, full range of affect.  GAD-7 score of 10 PHQ-9 score of 2   Assessment and Plan:  Generalized anxiety disorder - various treatment options, risks/SE of meds reviewd. Start lexapro, xanax prn. Counseling encouraged - Plan: escitalopram (LEXAPRO) 10 MG tablet, ALPRAZolam (XANAX) 0.25 MG tablet  Lexapro 10mg  #30 x 1 RF Alprazolam 0.25 #15  F/u 6 weeks  We reviewed potential SSRI, SNRI and Buspar as preventative options, and xanax as prn. Can change to Buspar, if not tolerating SSRI.   Follow Up Instructions:    I discussed the assessment and treatment plan with the patient. The patient was provided an opportunity to ask questions and all were answered. The patient agreed with the plan and demonstrated an understanding of the instructions.   The patient was advised to call back or seek an in-person evaluation if the symptoms worsen or if the condition fails to improve as anticipated.  I provided 43 minutes of video face-to-face time during this encounter.  Additional time was spent in chart review and documentation.   Vikki Ports, MD

## 2020-10-12 NOTE — Patient Instructions (Signed)
Start lexapro at 1/2 tablet by mouth once daily. If you have side effects, you can start even lower.   After a week, assuming that any side effects have resolved, then increase to full tablet.  Stay on the full tablet until follow-up visit. If it makes you sleepy, change to taking it at bedtime.  You may have headaches, nausea, or other stomach upset early on, which hopefully will resolve with time. You may have slight increase in anxiety early on when starting lexapro.  We are prescribing alprazolam to have on hand in case of severe anxiety, panic attacks. Do not mix with alcohol, and don't drive while taking it, as it may be sedating.  Counseling is encouraged. You may want to also look for support groups.  We will follow up in 6 weeks, but feel free to reach out sooner if you are having side effects, or other concerns.

## 2020-10-13 ENCOUNTER — Other Ambulatory Visit: Payer: Self-pay | Admitting: Family Medicine

## 2020-10-13 DIAGNOSIS — Z1231 Encounter for screening mammogram for malignant neoplasm of breast: Secondary | ICD-10-CM

## 2020-10-16 ENCOUNTER — Institutional Professional Consult (permissible substitution): Payer: Self-pay | Admitting: Family Medicine

## 2020-11-02 DIAGNOSIS — N3941 Urge incontinence: Secondary | ICD-10-CM | POA: Diagnosis not present

## 2020-11-28 ENCOUNTER — Other Ambulatory Visit: Payer: 59

## 2020-12-01 ENCOUNTER — Ambulatory Visit: Payer: 59 | Admitting: Endocrinology

## 2020-12-04 ENCOUNTER — Other Ambulatory Visit: Payer: Self-pay | Admitting: Endocrinology

## 2020-12-04 ENCOUNTER — Other Ambulatory Visit (INDEPENDENT_AMBULATORY_CARE_PROVIDER_SITE_OTHER): Payer: Medicare Other

## 2020-12-04 ENCOUNTER — Other Ambulatory Visit: Payer: Self-pay

## 2020-12-04 DIAGNOSIS — M810 Age-related osteoporosis without current pathological fracture: Secondary | ICD-10-CM

## 2020-12-04 DIAGNOSIS — E559 Vitamin D deficiency, unspecified: Secondary | ICD-10-CM

## 2020-12-04 DIAGNOSIS — E890 Postprocedural hypothyroidism: Secondary | ICD-10-CM | POA: Diagnosis not present

## 2020-12-04 LAB — VITAMIN D 25 HYDROXY (VIT D DEFICIENCY, FRACTURES): VITD: 60.35 ng/mL (ref 30.00–100.00)

## 2020-12-04 LAB — COMPREHENSIVE METABOLIC PANEL
ALT: 15 U/L (ref 0–35)
AST: 21 U/L (ref 0–37)
Albumin: 4.1 g/dL (ref 3.5–5.2)
Alkaline Phosphatase: 62 U/L (ref 39–117)
BUN: 8 mg/dL (ref 6–23)
CO2: 26 mEq/L (ref 19–32)
Calcium: 9.3 mg/dL (ref 8.4–10.5)
Chloride: 101 mEq/L (ref 96–112)
Creatinine, Ser: 0.62 mg/dL (ref 0.40–1.20)
GFR: 92.35 mL/min (ref >60.00)
Glucose, Bld: 83 mg/dL (ref 70–99)
Potassium: 4.4 mEq/L (ref 3.5–5.1)
Sodium: 135 mEq/L (ref 135–145)
Total Bilirubin: 0.5 mg/dL (ref 0.2–1.2)
Total Protein: 7 g/dL (ref 6.0–8.3)

## 2020-12-04 LAB — TSH: TSH: 0.49 u[IU]/mL (ref 0.35–5.50)

## 2020-12-04 LAB — T4, FREE: Free T4: 1.03 ng/dL (ref 0.60–1.60)

## 2020-12-06 ENCOUNTER — Other Ambulatory Visit: Payer: Self-pay

## 2020-12-06 ENCOUNTER — Ambulatory Visit
Admission: RE | Admit: 2020-12-06 | Discharge: 2020-12-06 | Disposition: A | Discharge status: Home / Self Care | Payer: Medicare Other | Source: Ambulatory Visit | Attending: Family Medicine | Admitting: Family Medicine

## 2020-12-06 DIAGNOSIS — Z1231 Encounter for screening mammogram for malignant neoplasm of breast: Secondary | ICD-10-CM | POA: Diagnosis not present

## 2020-12-07 ENCOUNTER — Ambulatory Visit (INDEPENDENT_AMBULATORY_CARE_PROVIDER_SITE_OTHER): Payer: Medicare Other | Admitting: Endocrinology

## 2020-12-07 ENCOUNTER — Encounter: Payer: Self-pay | Admitting: Endocrinology

## 2020-12-07 VITALS — BP 118/78 | HR 83 | Ht 62.75 in | Wt 128.8 lb

## 2020-12-07 DIAGNOSIS — N3941 Urge incontinence: Secondary | ICD-10-CM | POA: Diagnosis not present

## 2020-12-07 DIAGNOSIS — E559 Vitamin D deficiency, unspecified: Secondary | ICD-10-CM

## 2020-12-07 DIAGNOSIS — E890 Postprocedural hypothyroidism: Secondary | ICD-10-CM

## 2020-12-07 DIAGNOSIS — M810 Age-related osteoporosis without current pathological fracture: Secondary | ICD-10-CM | POA: Diagnosis not present

## 2020-12-07 NOTE — Progress Notes (Signed)
Patient ID: Theresa Schneider, female   DOB: Jun 22, 1955, 65 y.o.   MRN: [id number]    Reason for Appointment: Follow-up of thyroid and of osteoporosis   History of Present Illness:   Osteoporosis history: She has previously been evaluated and treated by her gynecologist since about 2004. She was given a trial of Actonel about 10 years ago. She  had difficulty tolerating this because of abdominal discomfort and not clear if she took this regularly, probably not over 2 years  She was subsequently given Prolia starting in about 2012 and she got only 3 injections. The injection was stopped because of lack of insurance coverage.  She got Reclast in 10/2013 since her bone density done by her gynecologist on 09/21/13 showed a reduced T score of -3.5 at the lumbar spine.  Subsequently she has had Reclast about once a year without side effects Last injection was 11/09/2018  Bone density of 12/17 indicated the lumbar spine T-score of -2.4, showing statistically significant increase in BMD of Lumbar spine and left hip   Results from bone density of 01/14/2019 shows the BMD measured at [facility] L1-L4 is 0.904 g/cm2 with a T-score of -2.3.  VITAMIN D.: She has had normal levels on supplementation  Does not like to drink milk She is taking vitamin D3, 5000 U every third day   Lab Results  Component Value Date   VD25OH 60.35 12/04/2020    HYPOTHYROIDISM: Discussed in review of symptoms   Allergies as of 12/07/2020       Reactions   Codeine Phosphate Nausea And Vomiting   Latex Itching   senitivity        Medication List        Accurate as of December 07, 2020  9:37 AM. If you have any questions, ask your nurse or doctor.          ALPRAZolam 0.25 MG tablet Commonly known as: XANAX Take 1-2 tablets (0.25-0.5 mg total) by mouth 3 (three) times daily as needed for anxiety.   Azelastine HCl 0.15 % Soln   CALCIUM 500 PO Take 1 capsule by mouth 2 (two) times daily.    cholecalciferol 1000 units tablet Commonly known as: VITAMIN D Take 1,000 Units by mouth daily.   co-enzyme Q-10 30 MG capsule Take 30 mg by mouth 3 (three) times daily.   escitalopram 10 MG tablet Commonly known as: Lexapro Take 1/2 tablet by mouth every morning.  Increase to full tablet after a week if/when tolerated   fluticasone 50 MCG/ACT nasal spray Commonly known as: FLONASE Place 1 spray into both nostrils daily.   levothyroxine 88 MCG tablet Commonly known as: SYNTHROID Take 88 mcg by mouth daily before breakfast.   Magnesium 500 MG Caps Take 1 capsule by mouth daily.   MELATONIN PO Take 1 capsule by mouth daily.   montelukast 10 MG tablet Commonly known as: SINGULAIR Take 10 mg by mouth daily.   NON FORMULARY Take 4-6 drops by mouth 2 (two) times daily.   omeprazole 40 MG capsule Commonly known as: PRILOSEC TAKE ONE CAPSULE BY MOUTH DAILY BEFORE DINNER   Premarin vaginal cream Generic drug: conjugated estrogens   PRESCRIPTION MEDICATION Inject 1 application into the muscle every 7 (seven) days. Allergy shots weekly   ProAir HFA 108 (90 Base) MCG/ACT inhaler Generic drug: albuterol INHALE 1 2 PUFFS AS NEEDED EVERY 4 6 HRS AS NEEDED FOR COUGH, WHEEZE AND SHORTNESS OF BREATH   TURMERIC PO Take 1 capsule  by mouth daily.   VITAMIN C PO Take 5,000 mg by mouth daily.   zinc gluconate 50 MG tablet Take 50 mg by mouth daily.   zoledronic acid 5 MG/100ML Soln injection Commonly known as: RECLAST Inject 100 mLs (5 mg total) into the vein once.   ZYRTEC PO Take by mouth.        Past Medical History:  Diagnosis Date   Allergy    SEASONAL/PERENNIAL   Anxiety    Asthma    allergy related   Depression    GAD (generalized anxiety disorder)    Gastritis 2013   from NSAID's after foot surgery   Hallux rigidus    Hyperthyroidism     hx graves disease. dr Demetrios Isaacs pcp      Hypothyroidism    (since I-131 treatment)   Osteoporosis 2009    Tendinitis of right ankle 2007    Past Surgical History:  Procedure Laterality Date   BREAST EXCISIONAL BIOPSY Right 1994   benign    BREAST SURGERY     fibroadenoma, right   hallux rigidus Right 11/06/2011   ROTATOR CUFF REPAIR     right   TONSILLECTOMY      Family History  Problem Relation Age of Onset   Arthritis Mother        OSTEO, vs poss RA   Hypothyroidism Mother    Ulcers Mother        in esophagus   Vision loss Mother        macular hole   Heart attack Father 28   Colon polyps Father        benign   Heart disease Father        MI at 39; stents and pacemaker 54; CHF   Hypothyroidism Father    Diabetes Brother        obese; pre-diabetic   Heart disease Brother 75       MI   Hypothyroidism Sister    Colon polyps Sister    Depression Sister        poss bipolar   Hypothyroidism Sister        Hashimoto's   Deep vein thrombosis Brother    Colon cancer Paternal Grandfather 51   Cancer Maternal Grandfather        esophageal   Esophageal cancer Maternal Grandfather 34   Breast cancer Paternal Aunt    Colon polyps Paternal Aunt    COPD Paternal Aunt    Osteoporosis Paternal Aunt    Osteoporosis Cousin    Osteoporosis Paternal Grandmother    Cancer Maternal Grandmother        leukemia   Deep vein thrombosis Other    Breast cancer Maternal Aunt    Rectal cancer Neg Hx    Stomach cancer Neg Hx     Social History:  reports that she has never smoked. She has never used smokeless tobacco. She reports current alcohol use. She reports that she does not use drugs.  Allergies:  Allergies  Allergen Reactions   Codeine Phosphate Nausea And Vomiting   Latex Itching    senitivity   ROS     Wt Readings from Last 3 Encounters:  12/07/20 128 lb 12.8 oz (58.4 kg)  10/12/20 126 lb 12.8 oz (57.5 kg)  09/13/20 128 lb (58.1 kg)    She has hypothyroidism which was first diagnosed after treatment for Graves' disease several years ago  She was previously taking  the brand-name Synthroid, from the Synthroid direct program  Again she is taking generic preparation because of Medicare coverage  She takes her levothyroxine consistently in the morning before eating  Because of family stress she has had more fatigue but no cold intolerance or shakiness  Her TSH is as follows   Lab Results  Component Value Date   TSH 0.49 12/04/2020   TSH 2.06 11/23/2019   TSH 1.76 10/06/2018   FREET4 1.03 12/04/2020   FREET4 1.11 11/23/2019   FREET4 1.07 10/06/2018      Examination:   BP 118/78   Pulse 83   Ht 5' 2.75" (1.594 m)   Wt 128 lb 12.8 oz (58.4 kg)   LMP 01/04/2005   SpO2 97%   BMI 23.00 kg/m       Assessments   Osteoporosis: She is continue to be asymptomatic Had a baseline T score of -3.5 along with a family history of osteoporosis  She has had Reclast 5 times, with the last infusion done in 11/2018  In 01/2019 bone density had shown an improvement in her T score, with the result of -2.3  Vitamin D level is consistently therapeutic  She will now be scheduled for her Reclast infusion and likely continue this for at least 3 more years Will consider doing bone density next year  Continue calcium supplements and vitamin D as before  Hypothyroidism, post ablative with stable TSH on current regimen of 88 mcg generic of Synthroid Although asymptomatic her TSH is low normal Because of her osteoporosis we will reduce her dosage to 6-1/2 tablets a week  Follow-up in 4 months   Celie Desrochers 12/07/2020, 9:37 AM

## 2020-12-07 NOTE — Patient Instructions (Signed)
Take 1/2 thyroid on Sundays

## 2020-12-09 ENCOUNTER — Other Ambulatory Visit: Payer: Self-pay | Admitting: Endocrinology

## 2020-12-12 ENCOUNTER — Telehealth: Payer: Self-pay | Admitting: Nutrition

## 2020-12-12 ENCOUNTER — Other Ambulatory Visit: Payer: Self-pay | Admitting: Endocrinology

## 2020-12-12 NOTE — Telephone Encounter (Signed)
LVM to call me to schedule reclast infusion

## 2020-12-26 ENCOUNTER — Ambulatory Visit: Payer: Medicare Other

## 2021-01-01 ENCOUNTER — Ambulatory Visit (INDEPENDENT_AMBULATORY_CARE_PROVIDER_SITE_OTHER): Payer: Medicare Other | Admitting: Nutrition

## 2021-01-01 ENCOUNTER — Other Ambulatory Visit: Payer: Self-pay

## 2021-01-01 DIAGNOSIS — M810 Age-related osteoporosis without current pathological fracture: Secondary | ICD-10-CM

## 2021-01-04 NOTE — Patient Instructions (Signed)
Drink 4-6 glasses of water today Continue to take your calcium and vit. D per Dr. Ronnie Derby orders

## 2021-01-04 NOTE — Progress Notes (Signed)
Per Dr. Ronnie Derby note on 12/07/20, and after the patient signed the consent, an IV was started in her right arm.  3 ccs of normal saline was infused, and the site showed no signs of infiltration.  At 10:40 AM, '5mg'$ . Of zolendronic acid was infused until 11:05.  The patient reported no discomfort or dizziness.  2 ccs of normal saline was infused and the IV was discontinued at 11:07 AM.  She was encouraged to continue her Calcium and Vit. D per Dr. Ronnie Derby directions.  She agreed to do this and had no final questions.

## 2021-01-08 NOTE — Progress Notes (Signed)
Start time: 10:35 End time: 11:02  Virtual Visit via Video Note  I connected with Theresa Schneider on 01/10/21 by a video enabled telemedicine application and verified that I am speaking with the correct person using two identifiers.  Location: Patient: home Provider: office   I discussed the limitations of evaluation and management by telemedicine and the availability of in person appointments. The patient expressed understanding and agreed to proceed.  History of Present Illness:  Chief Complaint  Patient presents with   Follow-up    VIRTUAL follow up since starting Lexapro. Patient states that the medication was making her feel like a "zombie". She is only taking 1/4 tablet, started this about a week ago and is taking at night.    Patient presents for f/u on anxiety. She was started on Lexapro in June.  Stressors include her husband's health. She stated they were "barking at each other", family felt she wasn't patient or understanding (she didn't feel they understood what she was also going through). She was getting angry at him when he wasn't complaints, though at last visit she reported being "too tired to be angry, now I'm just broken-hearted". She felt overwhelmed, stretched too thin ("like a rubber band"). Prior to starting lexapro she had used some CBD oil (oral) to help her sleep, Melatonin also helped. GAD-7 score was 10, PHQ-9 score was 2 prior to Lexapro.  Today she reports  that her husband got a feeding tube in July. He can't eat or drink, so she doesn't want to cook. She thinks he is depressed but won't do anything about it. Her dad isn't well. Husband's mother is his first priority rather than himself, which bothers her some.. She has been walking, but having some pain (needed surgery on other foot). She has started painting, which helps a lot with her moods/anxiety. She has started volunteering at her church.  She started at 1/2 tablet of Lexapro in June, went up to the  full pill, but wasn't motivated to do anything, was sleepy all the time, felt like a Zombie.  She wasn't anxious, "I could have cared less about anything", nothing fazed her.  She was taking it in the morning.  She stopped it while doing training (volunteer), needed to be awake.  She started feeling anxious again, so she restarted it, at 1/4 tablet, taking it at night. She has been taking this for a week. Denies side effects currently, can't really tell a difference yet.  She hasn't been getting counseling (had a therapist, who didn't take Medicare).   Osteoporosis and post-ablative hypothyroidism: Since her last visit she has seen Dr. Dwyane Dee.  She was give Reclast infusion (with plans to repeat DEXA next year), and thyroid dose was lowered to 6.5 tablets/week (1/2 dose on Sundays), given her osteoporosis and low-normal TSH.   Lab Results  Component Value Date   TSH 0.49 12/04/2020    PMH, PSH, SH reviewed  Outpatient Encounter Medications as of 01/10/2021  Medication Sig Note   cholecalciferol (VITAMIN D) 1000 units tablet Take 1,000 Units by mouth daily. 09/13/2020: Taking 5000 IU a few times/week   co-enzyme Q-10 30 MG capsule Take 30 mg by mouth 3 (three) times daily.    escitalopram (LEXAPRO) 10 MG tablet Take 1/2 tablet by mouth every morning.  Increase to full tablet after a week if/when tolerated 01/10/2021: Taking 1/4 tablet   levothyroxine (SYNTHROID) 88 MCG tablet Take 88 mcg by mouth daily before breakfast.    Magnesium 500  MG CAPS Take 1 capsule by mouth daily.    PREMARIN vaginal cream  09/13/2020: Only using it once a week, peri-urethrally only    PRESCRIPTION MEDICATION Inject 1 application into the muscle every 7 (seven) days. Allergy shots weekly    TURMERIC PO Take 1 capsule by mouth daily.    zinc gluconate 50 MG tablet Take 50 mg by mouth daily.    zoledronic acid (RECLAST) 5 MG/100ML SOLN injection Inject 100 mLs (5 mg total) into the vein once.    ALPRAZolam (XANAX) 0.25 MG  tablet Take 1-2 tablets (0.25-0.5 mg total) by mouth 3 (three) times daily as needed for anxiety. (Patient not taking: Reported on 01/10/2021)    Ascorbic Acid (VITAMIN C PO) Take 5,000 mg by mouth daily. (Patient not taking: Reported on 01/10/2021)    Azelastine HCl 0.15 % SOLN  (Patient not taking: Reported on 01/10/2021)    Calcium-Magnesium-Vitamin D (CALCIUM 500 PO) Take 1 capsule by mouth 2 (two) times daily.  (Patient not taking: Reported on 01/10/2021) 09/13/2020: Taking once daily   Cetirizine HCl (ZYRTEC PO) Take by mouth. (Patient not taking: Reported on 01/10/2021)    fluticasone (FLONASE) 50 MCG/ACT nasal spray Place 1 spray into both nostrils daily.  (Patient not taking: Reported on 01/10/2021)    MELATONIN PO Take 1 capsule by mouth daily. (Patient not taking: Reported on 01/10/2021)    montelukast (SINGULAIR) 10 MG tablet Take 10 mg by mouth daily. (Patient not taking: Reported on 01/10/2021) 09/13/2019: Doesn't always take every day   NON FORMULARY Take 4-6 drops by mouth 2 (two) times daily. (Patient not taking: Reported on 01/10/2021) 10/12/2020: CBD OIL    omeprazole (PRILOSEC) 40 MG capsule TAKE ONE CAPSULE BY MOUTH DAILY BEFORE DINNER (Patient not taking: Reported on 01/10/2021)    PROAIR HFA 108 (90 Base) MCG/ACT inhaler INHALE 1 2 PUFFS AS NEEDED EVERY 4 6 HRS AS NEEDED FOR COUGH, WHEEZE AND SHORTNESS OF BREATH (Patient not taking: Reported on 01/10/2021) 09/13/2020: Uses prn, usually just when ill   [DISCONTINUED] levothyroxine (SYNTHROID) 100 MCG tablet TAKE ONE TABLET BY MOUTH DAILY BEFORE BREAKFAST    No facility-administered encounter medications on file as of 01/10/2021.   Allergies  Allergen Reactions   Codeine Phosphate Nausea And Vomiting   Latex Itching    senitivity    ROS: no fever, chills, URI symptoms, dizziness, chest pain, shortness of breath, n/v/d. No GI or GU complaints. Occasional headaches. Anxiety, irritability per HPI.   She takes CBD most nights, not needing melatonin.   It helps with her anxiety.  Denies insomnia.    Observations/Objective:  BP 101/77   Pulse 83   Ht 5' 2.75" (1.594 m)   Wt 127 lb 3.2 oz (57.7 kg)   LMP 01/04/2005   BMI 22.71 kg/m   Wt Readings from Last 3 Encounters:  01/10/21 127 lb 3.2 oz (57.7 kg)  12/07/20 128 lb 12.8 oz (58.4 kg)  10/12/20 126 lb 12.8 oz (57.5 kg)   Well-appearing, pleasant female, in no distress. She is alert, oriented, in good spirits. Normal eye contact, speech. Exam is limited due to virtual nature of the visit.   GAD 7 : Generalized Anxiety Score 01/10/2021 10/12/2020  Nervous, Anxious, on Edge 2 3  Control/stop worrying 3 1  Worry too much - different things 0 1  Trouble relaxing 0 1  Restless 1 0  Easily annoyed or irritable 1 3  Afraid - awful might happen 1 1  Total GAD 7  Score 8 10  Anxiety Difficulty Somewhat difficult -    Assessment and Plan:  Generalized anxiety disorder - painting, walking, volunteering has helped some. Medication only restarted at low dose recently; increase to 1/2 tablet qhs.   Postablative hypothyroidism - dose recently decreased slightly.  Has f/u scheduled with endo  Other osteoporosis, unspecified pathological fracture presence  Counseled re: potential risks/SE of lexapro.  Reminded that she should reach out with any concerns or side effects (rather than managing on her own). Since tolerating 1/4 tablet qHS, will increase now to 1/2 tablet qHS, hold there for 4 weeks, and if anxiety not adequately treated, but tolerating, can further increase if needed.  Can change to '5mg'$  tablet if need to titrate up slower (ie 7.'5mg'$  dose).  Discussed Prozac as last sedating SSRI, versus Buspar as alternative treatment if not tolerating SSRI or not getting to goal.   Follow Up Instructions:    I discussed the assessment and treatment plan with the patient. The patient was provided an opportunity to ask questions and all were answered. The patient agreed with the plan and  demonstrated an understanding of the instructions.   The patient was advised to call back or seek an in-person evaluation if the symptoms worsen or if the condition fails to improve as anticipated.  I spent 30 minutes dedicated to the care of this patient, including pre-visit review of records, face to face time, post-visit ordering of testing and documentation.    Vikki Ports, MD

## 2021-01-10 ENCOUNTER — Encounter: Payer: Self-pay | Admitting: Family Medicine

## 2021-01-10 ENCOUNTER — Telehealth (INDEPENDENT_AMBULATORY_CARE_PROVIDER_SITE_OTHER): Payer: Medicare Other | Admitting: Family Medicine

## 2021-01-10 VITALS — BP 101/77 | HR 83 | Ht 62.75 in | Wt 127.2 lb

## 2021-01-10 DIAGNOSIS — E890 Postprocedural hypothyroidism: Secondary | ICD-10-CM

## 2021-01-10 DIAGNOSIS — F411 Generalized anxiety disorder: Secondary | ICD-10-CM

## 2021-01-10 DIAGNOSIS — M818 Other osteoporosis without current pathological fracture: Secondary | ICD-10-CM

## 2021-01-10 NOTE — Patient Instructions (Addendum)
Increase to half tablet, and continue to take at bedtime. You can stay at the half tablet for 4 weeks to see how you feel. If you aren't having side effects, but are still feeling anxious, then increase the dose further. You can increase to 10 mg tablet. If this is too much/too strong, we can send in a new prescription for 5 mg tablet so that you can take 7.'5mg'$  dose (1.5 tablets).  Let us know when you need a refill, and if you'd prefer the '5mg'$  vs the '10mg'$  (if still cutting tablets or taking 7.'5mg'$  dose).    We also discussed Prozac (as least sedating SSRI), and buspar as a different class of medication to treat anxiety (but is twice daily).   Please remember to get your flu shot.  Please get the new COVID booster when available (bivalent). You can either get them the same day or 2 weeks apart.

## 2021-01-11 DIAGNOSIS — N3941 Urge incontinence: Secondary | ICD-10-CM | POA: Diagnosis not present

## 2021-02-07 DIAGNOSIS — H1045 Other chronic allergic conjunctivitis: Secondary | ICD-10-CM | POA: Diagnosis not present

## 2021-02-07 DIAGNOSIS — J3089 Other allergic rhinitis: Secondary | ICD-10-CM | POA: Diagnosis not present

## 2021-02-07 DIAGNOSIS — J4530 Mild persistent asthma, uncomplicated: Secondary | ICD-10-CM | POA: Diagnosis not present

## 2021-02-07 DIAGNOSIS — J3081 Allergic rhinitis due to animal (cat) (dog) hair and dander: Secondary | ICD-10-CM | POA: Diagnosis not present

## 2021-02-08 DIAGNOSIS — N3941 Urge incontinence: Secondary | ICD-10-CM | POA: Diagnosis not present

## 2021-02-12 DIAGNOSIS — R350 Frequency of micturition: Secondary | ICD-10-CM | POA: Diagnosis not present

## 2021-02-26 ENCOUNTER — Telehealth: Payer: Self-pay | Admitting: Family Medicine

## 2021-02-26 NOTE — Telephone Encounter (Signed)
Pt advised.

## 2021-02-26 NOTE — Telephone Encounter (Signed)
Pt has a small puncture wound on her finger received from a old piece of furniture. Pt states it is not red, no streaks and is not sore. She was concerned about tetanus. Her last one was 07/15. Does she need a booster. Please advise pt at [phone].

## 2021-03-08 DIAGNOSIS — N3941 Urge incontinence: Secondary | ICD-10-CM | POA: Diagnosis not present

## 2021-04-05 DIAGNOSIS — N3941 Urge incontinence: Secondary | ICD-10-CM | POA: Diagnosis not present

## 2021-04-09 ENCOUNTER — Other Ambulatory Visit: Payer: Self-pay

## 2021-04-09 ENCOUNTER — Other Ambulatory Visit (INDEPENDENT_AMBULATORY_CARE_PROVIDER_SITE_OTHER): Payer: Medicare Other

## 2021-04-09 DIAGNOSIS — E890 Postprocedural hypothyroidism: Secondary | ICD-10-CM | POA: Diagnosis not present

## 2021-04-09 LAB — T4, FREE: Free T4: 0.91 ng/dL (ref 0.60–1.60)

## 2021-04-09 LAB — TSH: TSH: 4.12 u[IU]/mL (ref 0.35–5.50)

## 2021-04-12 ENCOUNTER — Other Ambulatory Visit: Payer: Self-pay

## 2021-04-12 ENCOUNTER — Encounter: Payer: Self-pay | Admitting: Endocrinology

## 2021-04-12 ENCOUNTER — Ambulatory Visit (INDEPENDENT_AMBULATORY_CARE_PROVIDER_SITE_OTHER): Payer: Medicare Other | Admitting: Endocrinology

## 2021-04-12 VITALS — BP 136/84 | HR 88 | Ht 62.0 in | Wt 130.8 lb

## 2021-04-12 DIAGNOSIS — E890 Postprocedural hypothyroidism: Secondary | ICD-10-CM | POA: Diagnosis not present

## 2021-04-12 DIAGNOSIS — M810 Age-related osteoporosis without current pathological fracture: Secondary | ICD-10-CM | POA: Diagnosis not present

## 2021-04-12 NOTE — Patient Instructions (Signed)
Take 5000 U weekly of D3

## 2021-04-12 NOTE — Progress Notes (Signed)
Patient ID: Theresa Schneider, female   DOB: 1955-05-17, 65 y.o.   MRN: [id number]    Reason for Appointment: Follow-up of thyroid and of osteoporosis   History of Present Illness:   Osteoporosis history: She has previously been evaluated and treated by her gynecologist since about 2004. She was given a trial of Actonel about 10 years ago. She  had difficulty tolerating this because of abdominal discomfort and not clear if she took this regularly, probably not over 2 years  She was subsequently given Prolia starting in about 2012 and she got only 3 injections. The injection was stopped because of lack of insurance coverage.  She recently received Reclast in 10/2013 when her bone density done by her gynecologist on 09/21/13 showed a T score of -3.5 at the lumbar spine.  Subsequently she has had a total of 6 Reclast infusions  She has not had any side effects from these Last infusion was 01/01/2021 with a gap of 2 years  Bone density in 12/17 indicated the lumbar spine T-score of -2.4, showing significant increase in BMD of Lumbar spine and left hip  Results from bone density of 01/14/2019 shows the following BMD measured at [facility] L1-L4 is 0.904 g/cm2 with a T-score of -2.3.  VITAMIN D.: She has had normal levels on supplementation  Does not like to drink milk, is on Calcium tablets She is taking vitamin D only in the form of multivitamins about 1000 units a day, previously was taking higher doses   Lab Results  Component Value Date   VD25OH 60.35 12/04/2020    HYPOTHYROIDISM: Discussed in review of symptoms   Allergies as of 04/12/2021       Reactions   Codeine Phosphate Nausea And Vomiting   Latex Itching   senitivity        Medication List        Accurate as of April 12, 2021  9:22 AM. If you have any questions, ask your nurse or doctor.          ALPRAZolam 0.25 MG tablet Commonly known as: XANAX Take 1-2 tablets (0.25-0.5 mg total) by mouth 3  (three) times daily as needed for anxiety.   Azelastine HCl 0.15 % Soln   CALCIUM 500 PO Take 1 capsule by mouth 2 (two) times daily.   cholecalciferol 1000 units tablet Commonly known as: VITAMIN D Take 1,000 Units by mouth daily.   co-enzyme Q-10 30 MG capsule Take 30 mg by mouth 3 (three) times daily.   escitalopram 10 MG tablet Commonly known as: Lexapro Take 1/2 tablet by mouth every morning.  Increase to full tablet after a week if/when tolerated   fluticasone 50 MCG/ACT nasal spray Commonly known as: FLONASE Place 1 spray into both nostrils daily.   levothyroxine 88 MCG tablet Commonly known as: SYNTHROID Take 88 mcg by mouth daily before breakfast.   Magnesium 500 MG Caps Take 1 capsule by mouth daily.   MELATONIN PO Take 1 capsule by mouth daily.   montelukast 10 MG tablet Commonly known as: SINGULAIR Take 10 mg by mouth daily.   NON FORMULARY Take 4-6 drops by mouth 2 (two) times daily.   omeprazole 40 MG capsule Commonly known as: PRILOSEC TAKE ONE CAPSULE BY MOUTH DAILY BEFORE DINNER   Premarin vaginal cream Generic drug: conjugated estrogens   PRESCRIPTION MEDICATION Inject 1 application into the muscle every 7 (seven) days. Allergy shots weekly   ProAir HFA 108 (90 Base) MCG/ACT inhaler Generic  drug: albuterol   TURMERIC PO Take 1 capsule by mouth daily.   VITAMIN C PO Take 5,000 mg by mouth daily.   zinc gluconate 50 MG tablet Take 50 mg by mouth daily.   zoledronic acid 5 MG/100ML Soln injection Commonly known as: RECLAST Inject 100 mLs (5 mg total) into the vein once.   ZYRTEC PO Take by mouth.        Past Medical History:  Diagnosis Date   Allergy    SEASONAL/PERENNIAL   Anxiety    Asthma    allergy related   Depression    GAD (generalized anxiety disorder)    Gastritis 2013   from NSAID's after foot surgery   Hallux rigidus    Hyperthyroidism     hx graves disease. dr Demetrios Isaacs pcp      Hypothyroidism    (since  I-131 treatment)   Osteoporosis 2009   Tendinitis of right ankle 2007    Past Surgical History:  Procedure Laterality Date   BREAST EXCISIONAL BIOPSY Right 1994   benign    BREAST SURGERY     fibroadenoma, right   hallux rigidus Right 11/06/2011   ROTATOR CUFF REPAIR     right   TONSILLECTOMY      Family History  Problem Relation Age of Onset   Arthritis Mother        OSTEO, vs poss RA   Hypothyroidism Mother    Ulcers Mother        in esophagus   Vision loss Mother        macular hole   Heart attack Father 37   Colon polyps Father        benign   Heart disease Father        MI at 45; stents and pacemaker 9; CHF   Hypothyroidism Father    Diabetes Brother        obese; pre-diabetic   Heart disease Brother 43       MI   Hypothyroidism Sister    Colon polyps Sister    Depression Sister        poss bipolar   Hypothyroidism Sister        Hashimoto's   Deep vein thrombosis Brother    Colon cancer Paternal Grandfather 55   Cancer Maternal Grandfather        esophageal   Esophageal cancer Maternal Grandfather 30   Breast cancer Paternal Aunt    Colon polyps Paternal Aunt    COPD Paternal Aunt    Osteoporosis Paternal Aunt    Osteoporosis Cousin    Osteoporosis Paternal Grandmother    Cancer Maternal Grandmother        leukemia   Deep vein thrombosis Other    Breast cancer Maternal Aunt    Rectal cancer Neg Hx    Stomach cancer Neg Hx     Social History:  reports that she has never smoked. She has never used smokeless tobacco. She reports current alcohol use. She reports that she does not use drugs.  Allergies:  Allergies  Allergen Reactions   Codeine Phosphate Nausea And Vomiting   Latex Itching    senitivity   ROS     Wt Readings from Last 3 Encounters:  04/12/21 130 lb 12.8 oz (59.3 kg)  01/10/21 127 lb 3.2 oz (57.7 kg)  12/07/20 128 lb 12.8 oz (58.4 kg)    She has hypothyroidism which was first diagnosed after treatment for Graves' disease  several years ago  She  was previously taking the brand-name Synthroid, from the Synthroid direct program  Again she is taking generic preparation because of Medicare coverage  She takes her levothyroxine consistently in the morning before eating  Because of family stress she has had continued fatigue but no cold intolerance or weight gain Her dose was reduced by half tablet weekly in August because of low normal TSH  Her TSH is as follows   Lab Results  Component Value Date   TSH 4.12 04/09/2021   TSH 0.49 12/04/2020   TSH 2.06 11/23/2019   FREET4 0.91 04/09/2021   FREET4 1.03 12/04/2020   FREET4 1.11 11/23/2019      Examination:   BP 136/84 (BP Location: Left Arm, Patient Position: Sitting, Cuff Size: Normal)   Pulse 88   Ht 5\' 2"  (1.575 m)   Wt 130 lb 12.8 oz (59.3 kg)   LMP 01/04/2005   SpO2 98%   BMI 23.92 kg/m       Assessments   Osteoporosis: She has postmenopausal osteoporosis without secondary factors She had a baseline T score of -3.5 along with a family history of osteoporosis  She has had Reclast infusions 6 times, with the last infusion done in 12/2020  In 01/2019 bone density had shown an improvement in her T score, with the result of -2.3  Vitamin D level is previously therapeutic  She will now be scheduled for a follow-up bone density Unless her bone density shows secondary decline in T score she will not have any further infusions   She will continue calcium supplements Will need to take vitamin D extra 5000 units on weekends  Hypothyroidism, post ablative with stable TSH on current regimen of 88 mcg generic of Synthroid She is following her dosage of levothyroxine 88 mcg, 6-1/2 tablets a week Although she is showing a TSH of about 4 not clear if she is symptomatic, has fatigue for other reasons  Follow-up in 6 months on the same regimen   Elayne Snare 04/12/2021, 9:22 AM   -

## 2021-04-17 ENCOUNTER — Telehealth: Payer: Self-pay

## 2021-04-17 ENCOUNTER — Ambulatory Visit (INDEPENDENT_AMBULATORY_CARE_PROVIDER_SITE_OTHER): Payer: Medicare Other | Admitting: Podiatry

## 2021-04-17 ENCOUNTER — Other Ambulatory Visit: Payer: Self-pay

## 2021-04-17 ENCOUNTER — Ambulatory Visit (INDEPENDENT_AMBULATORY_CARE_PROVIDER_SITE_OTHER): Payer: Medicare Other

## 2021-04-17 ENCOUNTER — Encounter: Payer: Self-pay | Admitting: Podiatry

## 2021-04-17 DIAGNOSIS — M205X2 Other deformities of toe(s) (acquired), left foot: Secondary | ICD-10-CM

## 2021-04-17 DIAGNOSIS — H1045 Other chronic allergic conjunctivitis: Secondary | ICD-10-CM | POA: Insufficient documentation

## 2021-04-17 DIAGNOSIS — J4530 Mild persistent asthma, uncomplicated: Secondary | ICD-10-CM | POA: Insufficient documentation

## 2021-04-17 DIAGNOSIS — J301 Allergic rhinitis due to pollen: Secondary | ICD-10-CM | POA: Insufficient documentation

## 2021-04-17 DIAGNOSIS — J3081 Allergic rhinitis due to animal (cat) (dog) hair and dander: Secondary | ICD-10-CM | POA: Insufficient documentation

## 2021-04-17 DIAGNOSIS — J309 Allergic rhinitis, unspecified: Secondary | ICD-10-CM | POA: Insufficient documentation

## 2021-04-17 NOTE — Telephone Encounter (Signed)
Bone density order has now been faxed over to [facility].  Fax Number: [phone]

## 2021-04-17 NOTE — Progress Notes (Signed)
Subjective:  Patient ID: Theresa Schneider, female    DOB: [birth year],  MRN: [id number] HPI Chief Complaint  Patient presents with   Foot Pain    1st MPJ left - aching x years, worsening, redness, swelling, limited ROM, certain shoes aggravate, had other foot corrected already (hallux rigidus)   New Patient (Initial Visit)    65 y.o. female presents with the above complaint.   ROS: Denies fever chills nausea vomiting muscle aches pains calf pain back pain chest pain shortness of breath.  Past Medical History:  Diagnosis Date   Allergy    SEASONAL/PERENNIAL   Anxiety    Asthma    allergy related   Depression    GAD (generalized anxiety disorder)    Gastritis 2013   from NSAID's after foot surgery   Hallux rigidus    Hyperthyroidism     hx graves disease. dr Demetrios Isaacs pcp      Hypothyroidism    (since I-131 treatment)   Osteoporosis 2009   Tendinitis of right ankle 2007   Past Surgical History:  Procedure Laterality Date   BREAST EXCISIONAL BIOPSY Right 1994   benign    BREAST SURGERY     fibroadenoma, right   hallux rigidus Right 11/06/2011   ROTATOR CUFF REPAIR     right   TONSILLECTOMY      Current Outpatient Medications:    ALPRAZolam (XANAX) 0.25 MG tablet, Take 1-2 tablets (0.25-0.5 mg total) by mouth 3 (three) times daily as needed for anxiety., Disp: 15 tablet, Rfl: 0   Ascorbic Acid (VITAMIN C PO), Take 5,000 mg by mouth daily., Disp: , Rfl:    Azelastine HCl 0.15 % SOLN, , Disp: , Rfl: 6   Calcium-Magnesium-Vitamin D (CALCIUM 500 PO), Take 1 capsule by mouth 2 (two) times daily., Disp: , Rfl:    Cetirizine HCl (ZYRTEC PO), Take by mouth., Disp: , Rfl:    cholecalciferol (VITAMIN D) 1000 units tablet, Take 1,000 Units by mouth daily., Disp: , Rfl:    co-enzyme Q-10 30 MG capsule, Take 30 mg by mouth 3 (three) times daily., Disp: , Rfl:    escitalopram (LEXAPRO) 10 MG tablet, Take 1/2 tablet by mouth every morning.  Increase to full tablet after a week if/when  tolerated, Disp: 30 tablet, Rfl: 1   fluticasone (FLONASE) 50 MCG/ACT nasal spray, Place 1 spray into both nostrils daily., Disp: , Rfl:    levothyroxine (SYNTHROID) 88 MCG tablet, Take 88 mcg by mouth daily before breakfast., Disp: , Rfl:    Magnesium 500 MG CAPS, Take 1 capsule by mouth daily., Disp: , Rfl:    MELATONIN PO, Take 1 capsule by mouth daily., Disp: , Rfl:    montelukast (SINGULAIR) 10 MG tablet, Take 10 mg by mouth daily., Disp: , Rfl: 6   NON FORMULARY, Take 4-6 drops by mouth 2 (two) times daily., Disp: , Rfl:    omeprazole (PRILOSEC) 40 MG capsule, TAKE ONE CAPSULE BY MOUTH DAILY BEFORE DINNER, Disp: 30 capsule, Rfl: 0   PREMARIN vaginal cream, , Disp: , Rfl: 3   PRESCRIPTION MEDICATION, Inject 1 application into the muscle every 7 (seven) days. Allergy shots weekly, Disp: , Rfl:    PROAIR HFA 108 (90 Base) MCG/ACT inhaler, , Disp: , Rfl:    TURMERIC PO, Take 1 capsule by mouth daily., Disp: , Rfl:    zinc gluconate 50 MG tablet, Take 50 mg by mouth daily., Disp: , Rfl:    zoledronic acid (RECLAST) 5 MG/100ML SOLN  injection, Inject 100 mLs (5 mg total) into the vein once., Disp: 100 mL, Rfl: 0  Allergies  Allergen Reactions   Codeine Phosphate Nausea And Vomiting   Latex Itching    senitivity   Review of Systems Objective:  There were no vitals filed for this visit.  General: Well developed, nourished, in no acute distress, alert and oriented x3   Dermatological: Skin is warm, dry and supple bilateral. Nails x 10 are well maintained; remaining integument appears unremarkable at this time. There are no open sores, no preulcerative lesions, no rash or signs of infection present.  Vascular: Dorsalis Pedis artery and Posterior Tibial artery pedal pulses are 2/4 bilateral with immedate capillary fill time. Pedal hair growth present. No varicosities and no lower extremity edema present bilateral.   Neruologic: Grossly intact via light touch bilateral. Vibratory intact via  tuning fork bilateral. Protective threshold with Semmes Wienstein monofilament intact to all pedal sites bilateral. Patellar and Achilles deep tendon reflexes 2+ bilateral. No Babinski or clonus noted bilateral.   Musculoskeletal: No gross boney pedal deformities bilateral. No pain, crepitus, or limitation noted with foot and ankle range of motion bilateral. Muscular strength 5/5 in all groups tested bilateral.  She has pain on end range of motion of the first metatarsophalangeal joint left barely getting about 10 degrees of dorsiflexion.  5 to 10 degrees of dorsiflexion.  5 degrees of plantarflexion.  Gait: Unassisted, Nonantalgic.    Radiographs:  Radiographs taken today demonstrate an osseously mature foot mild osteopenia is noted left.  Normal parabola slight increase in the first metatarsal angle about 10 degrees with a hallux abductus angle of about 8 degrees.  Slightly elevated first metatarsal dorsal spurring is noted with some fragmentation at the metatarsal phalangeal joint.  Assessment & Plan:   Assessment: Hallux limitus hallux valgus osteoarthritis first metatarsophalangeal joint left.  Plan: Discussed etiology pathology conservative versus surgical therapies.  At this point we discussed surgical intervention in great detail conservative therapies have failed her particularly shoe gear changes and decreasing her ambulation.  Consented her today for a Jake Michaelis arthroplast with this arthroplasty with a single silicone implant or possibly an Austin Youngswick osteotomy with screw.  She understands that this will be an intraoperative call.  Radiographically her joint does not appear to be as bad as it is in physical exam.  We did discuss possible postop complications which may include but not limited to postop pain bleeding swelling failure recurrence need further surgery overcorrection under correction loss of digit loss of limb loss of life.  We discussed the surgery center as well as  anesthesia group.  Provided her with information regarding this center as well as the group and a cam walker.  Follow-up with her in 1 month     Zyaire Dumas T. [place], [place]

## 2021-04-17 NOTE — Telephone Encounter (Signed)
-----   Message from Elayne Snare, MD sent at 04/12/2021 12:26 PM EST ----- Regarding: Bone density Bone density has been ordered for [facility] center of [facility].  May need to fax the order to them unless they can release it online

## 2021-05-03 DIAGNOSIS — N3941 Urge incontinence: Secondary | ICD-10-CM | POA: Diagnosis not present

## 2021-05-16 ENCOUNTER — Other Ambulatory Visit: Payer: Self-pay | Admitting: Podiatry

## 2021-05-16 MED ORDER — ONDANSETRON HCL 4 MG PO TABS: 4.0000 mg | ORAL_TABLET | Freq: Three times a day (TID) | ORAL | 0 refills | Status: DC | PRN

## 2021-05-16 MED ORDER — CEPHALEXIN 500 MG PO CAPS: 500.0000 mg | ORAL_CAPSULE | Freq: Three times a day (TID) | ORAL | 0 refills | Status: DC

## 2021-05-16 MED ORDER — HYDROCODONE-ACETAMINOPHEN 10-325 MG PO TABS: 1.0000 | ORAL_TABLET | Freq: Four times a day (QID) | ORAL | 0 refills | Status: AC | PRN

## 2021-05-17 ENCOUNTER — Telehealth: Payer: Self-pay | Admitting: Sports Medicine

## 2021-05-17 NOTE — Telephone Encounter (Signed)
Patient called answering service stating that her husband was rushed to the hospital tonight and she is on the way to the ER she is unsure of the husband status and if he is going to be admitted.  Patient is scheduled for elective surgery on tomorrow and is worried about her postoperative recovery and care.  After discussing case with her surgeon Dr. Milinda Pointer it has been recommended for her to postpone her surgery until a later time.  Patient is agreeable and we will have our surgery scheduler to her get her scheduled for surgery again at a later time once the status of her husband improves. -Dr.Jkwon Treptow

## 2021-05-24 ENCOUNTER — Encounter: Payer: Medicare Other | Admitting: Podiatry

## 2021-05-31 DIAGNOSIS — N3941 Urge incontinence: Secondary | ICD-10-CM | POA: Diagnosis not present

## 2021-06-05 ENCOUNTER — Encounter: Payer: Medicare Other | Admitting: Podiatry

## 2021-06-08 ENCOUNTER — Telehealth: Payer: Self-pay

## 2021-06-08 NOTE — Telephone Encounter (Signed)
Pt called to advise she is under a lot of stress. Her mother is falling and her husband is now on a feeding tube. She has not been able to have time to her self. Pt is also getting ready to have foot surgery soon. Appt was made to go over this with Dr. Tomi Bamberger. [place]

## 2021-06-10 NOTE — Progress Notes (Deleted)
° °  Patient called on 2/3 to inform us that she is under a lot of stress. Her mother is falling and her husband is now on a feeding tube. She has not been able to have time to her self. Pt is also getting ready to have foot surgery soon (was postponed when her husband went into the hospital last month).  We last discussed her anxiety at 01/2021 visit.  She had started on Lexapro in 10/2020, but only taking 1/4 tablet at the tiime of her visit.  At the full 10mg  dose she felt unmotivated to do anything, felt sleepy all the time (taking it in the morning).  She stopped the med, anxiety recurred, so restarted it at 1/4 tablet qHS.  At her Sept visit we encouraged her to increase to 1/2 tablet qHS, and if needed, could further titrate up dose (and possibly tolerate HS dosing at higher levels, vs change med). We had also discussed fluoxetine and buspar.  GAD-7 in June (prior to meds) was 10; in 01/2021 it was 8 (only on 1/4 tab for short while).    In September she reported she had started painting, which helps a lot with her moods/anxiety. She also started volunteering at her church. She was encouraged to get counseling (had a therapist, who didn't take Medicare).   She saw Dr. Dwyane Dee in December.  Her dose was reduced by half tablet weekly in August because of low normal TSH.  When labs last checked, taking levothyroxine 88 mcg, 6-1/2 tablets a week. No changes were made, to recheck in 28mos. Lab Results  Component Value Date   TSH 4.12 04/09/2021    PMH, PSH, SH reviewed     PHYSICAL EXAM:  LMP 01/04/2005   Wt Readings from Last 3 Encounters:  04/12/21 130 lb 12.8 oz (59.3 kg)  01/10/21 127 lb 3.2 oz (57.7 kg)  12/07/20 128 lb 12.8 oz (58.4 kg)      ASSESSMENT/PLAN:  GAD-7  Enter/offer flu and COVID vaccines Did she ever get counseling???  Need help finding therapist?

## 2021-06-11 ENCOUNTER — Telehealth (INDEPENDENT_AMBULATORY_CARE_PROVIDER_SITE_OTHER): Payer: Medicare Other | Admitting: Family Medicine

## 2021-06-11 ENCOUNTER — Encounter: Payer: Self-pay | Admitting: Family Medicine

## 2021-06-11 ENCOUNTER — Other Ambulatory Visit: Payer: Self-pay

## 2021-06-11 ENCOUNTER — Ambulatory Visit: Payer: Medicare Other | Admitting: Family Medicine

## 2021-06-11 VITALS — BP 137/88 | HR 90 | Ht 62.0 in | Wt 128.0 lb

## 2021-06-11 DIAGNOSIS — F411 Generalized anxiety disorder: Secondary | ICD-10-CM

## 2021-06-11 DIAGNOSIS — Z6379 Other stressful life events affecting family and household: Secondary | ICD-10-CM | POA: Diagnosis not present

## 2021-06-11 MED ORDER — BUSPIRONE HCL 5 MG PO TABS: ORAL_TABLET | ORAL | 2 refills | Status: DC

## 2021-06-11 NOTE — Progress Notes (Signed)
Start time: 3:32 End time: 4:06  Virtual Visit via Video Note  I connected with Theresa Schneider on 06/11/21 by a video enabled telemedicine application and verified that I am speaking with the correct person using two identifiers.  Location: Patient: home Provider: office   I discussed the limitations of evaluation and management by telemedicine and the availability of in person appointments. The patient expressed understanding and agreed to proceed.  History of Present Illness:  Chief Complaint  Patient presents with   Consult    VIRTUAL increased stress. Having foot surgery 2/24 and cannot exercise. Her parents are elderly and will not move. Her husband's health in declining. Her stress level has increased.    Patient reports being under a lot of stress. Her mother is falling, parents are losing medication, really struggling.  They live near [place], she goes to take her mom to the doctor, hard for her to deal with the heavy wheelchair. They are resistant to help from others.  Her parents are a "5 alarm mess".  Her husband is now on a feeding tube, permanently. He was hospitalized with aspiration pneumonia again, she reports she felt panicked the whole time.  The feeding tube is permanent, and is a big adjustment. She no longer wants to cook (feels bad/guilty, he can't eat), sad that they can no longer enjoy meals together.  Pt is also getting ready to have foot surgery soon (was postponed when her husband went into the hospital last month). Pain is managed with Tylenol, swelling doesn't go down, can't wear certain shoes. Since her last visit, her mother-in-law has died.   We last discussed her anxiety at 01/2021 visit.  She had started on Lexapro in 10/2020, but only taking 1/4 tablet at the tiime of her visit.  At the full 10mg  dose she felt unmotivated to do anything, felt sleepy all the time (she had been taking it in the morning).  She stopped the med, anxiety recurred, so  restarted it at 1/4 tablet qHS. At her Sept visit we encouraged her to increase to 1/2 tablet qHS, and if needed, could further titrate up dose (and possibly tolerate HS dosing at higher levels, vs change med). We had also discussed fluoxetine and buspar.  Today she recalls that when she went up on the lexapro, she still felt "sedated", also a little flat--couldn't cry at a sad movie, when taking 1/2 tablet at bedtime.  Doesn't recall how long she took it, not for very long. She isn't currently taking lexapro.   In September she reported she had started painting, which helped a lot with her moods/anxiety. She is still painting.  She hasn't been volunteering at her church.  She was encouraged to get counseling at prior visits (had a therapist, who didn't take Medicare). She has not yet done this.   Currently she reports sometimes feeling jittery, overwhelmed, emotionally overloaded. She is sleeping well.     PMH, PSH, SH reviewed  Outpatient Encounter Medications as of 06/11/2021  Medication Sig Note   azelastine (ASTELIN) 0.1 % nasal spray 1-2 puff in each nostril    busPIRone (BUSPAR) 5 MG tablet Start at 1 tablet twice daily.  May increase to three times daily vs 1.5 tablets twice daily    Calcium-Magnesium-Vitamin D (CALCIUM 500 PO) Take 1 capsule by mouth 2 (two) times daily. 09/13/2020: Taking once daily   Cetirizine HCl (ZYRTEC PO) Take by mouth. 06/11/2021: Takes every other day   cholecalciferol (VITAMIN D) 1000 units  tablet Take 1,000 Units by mouth daily. 09/13/2020: Taking 5000 IU a few times/week   levothyroxine (SYNTHROID) 88 MCG tablet Take 88 mcg by mouth daily before breakfast.    Magnesium 500 MG CAPS Take 1 capsule by mouth daily.    MELATONIN PO Take 1 capsule by mouth daily. 06/11/2021: 1.5mg    montelukast (SINGULAIR) 10 MG tablet Take 10 mg by mouth daily. 09/13/2019: Doesn't always take every day   NON FORMULARY Take 4-6 drops by mouth 2 (two) times daily. 10/12/2020: CBD OIL     PREMARIN vaginal cream  09/13/2020: Only using it once a week, peri-urethrally only    TURMERIC PO Take 1 capsule by mouth daily.    zinc gluconate 50 MG tablet Take 50 mg by mouth daily.    zoledronic acid (RECLAST) 5 MG/100ML SOLN injection Inject 100 mLs (5 mg total) into the vein once.    ALPRAZolam (XANAX) 0.25 MG tablet Take 1-2 tablets (0.25-0.5 mg total) by mouth 3 (three) times daily as needed for anxiety. (Patient not taking: Reported on 06/11/2021) 06/11/2021: As needed   Ascorbic Acid (VITAMIN C PO) Take 5,000 mg by mouth daily. (Patient not taking: Reported on 06/11/2021)    cephALEXin (KEFLEX) 500 MG capsule Take 1 capsule (500 mg total) by mouth 3 (three) times daily. (Patient not taking: Reported on 06/11/2021) 06/11/2021: For post op   co-enzyme Q-10 30 MG capsule Take 30 mg by mouth 3 (three) times daily. (Patient not taking: Reported on 06/11/2021)    escitalopram (LEXAPRO) 10 MG tablet Take 1/2 tablet by mouth every morning.  Increase to full tablet after a week if/when tolerated (Patient not taking: Reported on 06/11/2021) 06/11/2021: Stopped because she felt "like a zombie"   fluticasone (FLONASE) 50 MCG/ACT nasal spray Place 1 spray into both nostrils daily. (Patient not taking: Reported on 06/11/2021) 06/11/2021: Few times a week   omeprazole (PRILOSEC) 40 MG capsule TAKE ONE CAPSULE BY MOUTH DAILY BEFORE DINNER (Patient not taking: Reported on 06/11/2021) 06/11/2021: Takes when she is going out of town, lying flat to sleep   ondansetron (ZOFRAN) 4 MG tablet Take 1 tablet (4 mg total) by mouth every 8 (eight) hours as needed. (Patient not taking: Reported on 06/11/2021) 06/11/2021: For post op   PROAIR HFA 108 (90 Base) MCG/ACT inhaler  (Patient not taking: Reported on 06/11/2021) 09/13/2020: Uses prn, usually just when ill   [DISCONTINUED] Azelastine HCl 0.15 % SOLN     [DISCONTINUED] PRESCRIPTION MEDICATION Inject 1 application into the muscle every 7 (seven) days. Allergy shots weekly (Patient not taking:  Reported on 06/11/2021)    No facility-administered encounter medications on file as of 06/11/2021.   NOT taking buspar prior to today's visit  Allergies  Allergen Reactions   Codeine Phosphate Nausea And Vomiting   Latex Itching    senitivity    ROS: no fever, chills, headaches, dizziness, URI symptoms, chest pain.  +anxiety per HPI. Sleeping well.  Some decrease in appetite, but not always making the best food choices, so no overall change in weight. See HPI.    Observations/Objective:  BP 137/88    Pulse 90    Ht 5\' 2"  (1.575 m)    Wt 128 lb (58.1 kg)    LMP 01/04/2005    BMI 23.41 kg/m   Wt Readings from Last 3 Encounters:  06/11/21 128 lb (58.1 kg)  04/12/21 130 lb 12.8 oz (59.3 kg)  01/10/21 127 lb 3.2 oz (57.7 kg)   Well-appearing, pleasant female.  She appears to be in good spirits. She is alert, oriented, normal eye contact, speech, grooming. She is introspective, good insight.  Appears calm.   GAD 7 : Generalized Anxiety Score 06/11/2021 01/10/2021 10/12/2020  Nervous, Anxious, on Edge 2 2 3   Control/stop worrying 1 3 1   Worry too much - different things 2 0 1  Trouble relaxing 2 0 1  Restless 1 1 0  Easily annoyed or irritable 2 1 3   Afraid - awful might happen 0 1 1  Total GAD 7 Score 10 8 10   Anxiety Difficulty Somewhat difficult Somewhat difficult -      Assessment and Plan:  Generalized anxiety disorder - didn't tolerate lexapro.  Trial of low dose Buspar, titrate up if needed/tolerated.   Strongly encouraged counseling. Some techniques taught/discussed today - Plan: busPIRone (BUSPAR) 5 MG tablet  Stress due to illness of family member - husband and declining parents.  Will refer to [facility] for therapist  Virtual, text okay.   Follow up in 6 weeks (can be virtual). NV for flu and COVID boosters sooner.    Follow Up Instructions:    I discussed the assessment and treatment plan with the patient. The patient was provided an opportunity to ask  questions and all were answered. The patient agreed with the plan and demonstrated an understanding of the instructions.   The patient was advised to call back or seek an in-person evaluation if the symptoms worsen or if the condition fails to improve as anticipated.  I spent 36 minutes dedicated to the care of this patient, including pre-visit review of records, face to face time, post-visit ordering of testing and documentation.    Vikki Ports, MD

## 2021-06-11 NOTE — Patient Instructions (Signed)
We are referring you to [facility] to help get you matched up with a therapist.    Start Buspar 5mg  twice daily for up to a week.  If tolerating it without side effects, but if not noticing adequate relief in anxiety symptoms, you can either increase to 1 pill three times daily, vs take 1.5 tablet twice daily (total dose of 15 mg daily).  Contact us if you are not improving or aren't tolerating the medication.  We will have you return for a 6 week follow-up visit, but would like to hear from you before then if having any issues/concerns.  Please come for a nurse visit for your yearly flu shot and bivalent COVID booster. Someone should be calling you to schedule these.

## 2021-06-19 ENCOUNTER — Encounter: Payer: Medicare Other | Admitting: Podiatry

## 2021-06-27 ENCOUNTER — Encounter: Payer: Self-pay | Admitting: Endocrinology

## 2021-06-27 ENCOUNTER — Other Ambulatory Visit: Payer: Self-pay | Admitting: Podiatry

## 2021-06-27 DIAGNOSIS — E890 Postprocedural hypothyroidism: Secondary | ICD-10-CM

## 2021-06-27 MED ORDER — LEVOTHYROXINE SODIUM 88 MCG PO TABS: 88.0000 ug | ORAL_TABLET | Freq: Every day | ORAL | 2 refills | Status: DC

## 2021-06-28 DIAGNOSIS — N3941 Urge incontinence: Secondary | ICD-10-CM | POA: Diagnosis not present

## 2021-06-29 DIAGNOSIS — M25572 Pain in left ankle and joints of left foot: Secondary | ICD-10-CM | POA: Diagnosis not present

## 2021-06-29 DIAGNOSIS — M2012 Hallux valgus (acquired), left foot: Secondary | ICD-10-CM | POA: Diagnosis not present

## 2021-06-29 DIAGNOSIS — M205X2 Other deformities of toe(s) (acquired), left foot: Secondary | ICD-10-CM | POA: Diagnosis not present

## 2021-06-29 HISTORY — PX: OTHER SURGICAL HISTORY: SHX169

## 2021-07-02 ENCOUNTER — Telehealth: Payer: Self-pay | Admitting: *Deleted

## 2021-07-02 ENCOUNTER — Other Ambulatory Visit: Payer: Self-pay | Admitting: Podiatry

## 2021-07-02 MED ORDER — TRAMADOL HCL 50 MG PO TABS: 50.0000 mg | ORAL_TABLET | Freq: Three times a day (TID) | ORAL | 0 refills | Status: AC | PRN

## 2021-07-02 NOTE — Telephone Encounter (Signed)
Patient is calling because they hydrocodone is causing nausea and dizziness when taking and doesn't feel the medicine is helping with pain. Is there something else she can take? Please advise.

## 2021-07-03 ENCOUNTER — Other Ambulatory Visit: Payer: Self-pay | Admitting: Podiatry

## 2021-07-03 ENCOUNTER — Encounter: Payer: Medicare Other | Admitting: Podiatry

## 2021-07-03 ENCOUNTER — Telehealth: Payer: Self-pay | Admitting: Podiatry

## 2021-07-03 MED ORDER — TAPENTADOL HCL 50 MG PO TABS: 50.0000 mg | ORAL_TABLET | Freq: Four times a day (QID) | ORAL | 0 refills | Status: DC | PRN

## 2021-07-03 NOTE — Telephone Encounter (Signed)
Called patient and let her know that prescription was called into pharmacy. Also advised her to unwrap the ace bandage and rewrap it loosely.  Patient very appreciative of the assistance. She states she is only going to take it at night so that it takes the edge off the pain so she can sleep.

## 2021-07-03 NOTE — Telephone Encounter (Signed)
error 

## 2021-07-03 NOTE — Telephone Encounter (Signed)
Patient called back she is really sick with the hydrocodone and she took tramadol before and it did not really work for her, she would like to try nucynta , a friend of her takes it and it doesn't make them sick.

## 2021-07-05 ENCOUNTER — Ambulatory Visit (INDEPENDENT_AMBULATORY_CARE_PROVIDER_SITE_OTHER): Payer: Medicare Other | Admitting: Podiatry

## 2021-07-05 ENCOUNTER — Encounter: Payer: Self-pay | Admitting: Podiatry

## 2021-07-05 ENCOUNTER — Ambulatory Visit (INDEPENDENT_AMBULATORY_CARE_PROVIDER_SITE_OTHER): Payer: Medicare Other

## 2021-07-05 ENCOUNTER — Other Ambulatory Visit: Payer: Self-pay

## 2021-07-05 DIAGNOSIS — M205X2 Other deformities of toe(s) (acquired), left foot: Secondary | ICD-10-CM

## 2021-07-05 DIAGNOSIS — Z9889 Other specified postprocedural states: Secondary | ICD-10-CM

## 2021-07-05 NOTE — Progress Notes (Signed)
She presents today for her first postop visit date of surgery is 06/29/2021 Jake Michaelis arthroplasty single silicone implant left foot.  She states that is okay I could not take the pain medication because of kidney upset stomach and some lightheaded.  So she is been taking Tylenol and ibuprofen separately she had takes tramadol at nighttime. ? ?Objective: Vital signs are stable she is alert oriented x3 she denies fever chills nausea vomit muscle aches pains calf pain back pain chest pain shortness of breath.  She does have a dry sterile dressing intact was removed demonstrates moderate edema overlying the first metatarsophalangeal joint she does have good active range of motion dorsiflexion and plantarflexion.  Incision site is healing very nicely is well coapted there is no purulence no malodor.  Radiographs taken today demonstrate an implant that is in place with 2 grommets and in good position. ? ?Assessment: Well-healing surgical foot. ? ?Plan: I encouraged her to take 3 ibuprofen 1 extra-strength Tylenol 4 times a day if necessary.  She continue to take her tramadol at nighttime with Tylenol.  She will continue the cam boot and keeping it dry and elevate is much as possible but range of motion of the toe is imperative.  We redressed it today dressed a compressive dressing. ?

## 2021-07-12 ENCOUNTER — Ambulatory Visit (INDEPENDENT_AMBULATORY_CARE_PROVIDER_SITE_OTHER): Payer: Medicare Other | Admitting: Podiatry

## 2021-07-12 ENCOUNTER — Other Ambulatory Visit: Payer: Self-pay

## 2021-07-12 ENCOUNTER — Encounter: Payer: Self-pay | Admitting: Podiatry

## 2021-07-12 DIAGNOSIS — M205X2 Other deformities of toe(s) (acquired), left foot: Secondary | ICD-10-CM

## 2021-07-12 DIAGNOSIS — Z9889 Other specified postprocedural states: Secondary | ICD-10-CM

## 2021-07-12 NOTE — Progress Notes (Signed)
She presents today 2 weeks status post Keller arthroplasty with a single silicone implant June 29, 2021.  She states that it feels better than it did. ? ?Objective: Vital signs are stable she alert oriented x3 there is no erythema edema cellulitis drainage or odor.  She has a small blood blister distally at the incision site which I lanced today and drained.  Does not appear to be infectious she has good range of motion passive and active. ? ?Assessment: Well-healing surgical foot. ? ?Plan: Redressed today encouraged range of motion exercises I will follow-up with her in 2 weeks.  I did place her in a [place] shoe today. ?

## 2021-07-22 ENCOUNTER — Encounter: Payer: Self-pay | Admitting: Family Medicine

## 2021-07-22 ENCOUNTER — Encounter: Payer: Self-pay | Admitting: Endocrinology

## 2021-07-22 NOTE — Progress Notes (Signed)
Start time: 10:58 ?End time: 11:18 ?Limited video access due to poor signal.   ? ?Virtual Visit via Video Note ? ?I connected with Theresa Schneider on 07/23/21 by a video enabled telemedicine application and verified that I am speaking with the correct person using two identifiers. ? ?Location: ?Patient: home ?Provider: office ?  ?I discussed the limitations of evaluation and management by telemedicine and the availability of in person appointments. The patient expressed understanding and agreed to proceed. ? ?History of Present Illness: ? ?Chief Complaint  ?Patient presents with  ? Follow-up  ?  Follow up on anxiety. Stopped taking buspar due to dizziness.   ? ?Patient presents for follow-up on anxiety.  She didn't tolerate lexapro in the past (felt flat, sedated, even at low dose). She was last seen in February, when she reported sometimes feeling jittery, overwhelmed, emotionally overloaded. GAD-7 score was 10.  She was started on Buspar at '5mg'$  BID, and advised how to titrate up, if needed and tolerating. ?We had discussed counseling, and referral was put in through [facility]. ? ?Stressors include her aging parents who live near [place].  They are struggling (falling, losing medication), and are resistant to help from others, so she goes frequently to help. ?Husband has a permanent feeding tube which has been an adjustment for her, as they can't enjoy meals together, not cooking much because she feels bad/guilty that he can't eat. ?She had foot surgery last month. ? ?Currently she reports that her mother was in the hospital prior to her surgery, she stayed with her. She cannot drive since her surgery, so things were delegated to her sisters (one of whom is bipolar, the other one still works).  She has had some visits cancelled, they don't seem to be as concerned about things, but she can't help, so she doesn't worry about it. She talks to them daily. ? ?[facility] sent her a list of places. She has an appointment  tomorrow afternoon at [employer], virtually, with a Education officer, museum. ? ?There were some times that she felt a little off balance when taking the Buspar.  She was taking '5mg'$  BID.  Thinks it helped the anxiety.  She had a lot of dizziness and nausea from her pain meds after her surgery.  She read that the medication (buspar) could cause dizziness, so stopped the buspar because she needed to manage the pain. She switched Tramadol once daily at night so she could sleep, which also made her feel a little dizzy and off balanced.   ?She stopped taking the Buspar.  No longer feels off-balanced (even when walking in a boot), so she thinks the buspar contributed to that. ? ?She is under care of Dr. Dwyane Dee for her thyroid. She reports contacting their office for a refill of her Synthroid, but reports getting 39mg dose when she had been taking 1067m.  She sent a message to try and clarify this, hasn't heard back yet. ? ? ?PMH, PSH, SH reviewed ? ?Outpatient Encounter Medications as of 07/23/2021  ?Medication Sig Note  ? azelastine (ASTELIN) 0.1 % nasal spray 1-2 puff in each nostril   ? calcium carbonate (TUMS - DOSED IN MG ELEMENTAL CALCIUM) 500 MG chewable tablet Chew 2 tablets by mouth daily. 07/23/2021: At night before bed  ? Calcium-Magnesium-Vitamin D (CALCIUM 500 PO) Take 1 capsule by mouth 2 (two) times daily. 09/13/2020: Taking once daily  ? Cetirizine HCl (ZYRTEC PO) Take by mouth. 06/11/2021: Takes every other day  ? cholecalciferol (VITAMIN D)  1000 units tablet Take 1,000 Units by mouth daily.   ? fluticasone (FLONASE) 50 MCG/ACT nasal spray Place 1 spray into both nostrils daily. 07/23/2021: nightly  ? levothyroxine (SYNTHROID) 88 MCG tablet Take 88 mcg by mouth daily before breakfast.   ? Magnesium 500 MG CAPS Take 1 capsule by mouth daily.   ? MELATONIN PO Take 1 capsule by mouth daily. 06/11/2021: 1.'5mg'$   ? montelukast (SINGULAIR) 10 MG tablet Take 10 mg by mouth daily. 09/13/2019: Doesn't always take every day  ? NON  FORMULARY Take 4-6 drops by mouth 2 (two) times daily. 10/12/2020: CBD OIL ?  ? Omega-3 Fatty Acids (FISH OIL) 1000 MG CAPS Take 1 capsule by mouth daily.   ? TURMERIC PO Take 1 capsule by mouth daily.   ? vitamin C (ASCORBIC ACID) 500 MG tablet Take 500 mg by mouth daily.   ? zinc gluconate 50 MG tablet Take 50 mg by mouth daily. 07/23/2021: Every 2-3 days  ? zoledronic acid (RECLAST) 5 MG/100ML SOLN injection Inject 100 mLs (5 mg total) into the vein once.   ? [DISCONTINUED] Ascorbic Acid (VITAMIN C PO) Take 5,000 mg by mouth daily.   ? ALPRAZolam (XANAX) 0.25 MG tablet Take 1-2 tablets (0.25-0.5 mg total) by mouth 3 (three) times daily as needed for anxiety. (Patient not taking: Reported on 06/11/2021) 06/11/2021: As needed  ? busPIRone (BUSPAR) 5 MG tablet Start at 1 tablet twice daily.  May increase to three times daily vs 1.5 tablets twice daily (Patient not taking: Reported on 07/23/2021)   ? escitalopram (LEXAPRO) 10 MG tablet Take 1/2 tablet by mouth every morning.  Increase to full tablet after a week if/when tolerated (Patient not taking: Reported on 06/11/2021) 06/11/2021: Stopped because she felt "like a zombie"  ? omeprazole (PRILOSEC) 40 MG capsule TAKE ONE CAPSULE BY MOUTH DAILY BEFORE DINNER (Patient not taking: Reported on 06/11/2021) 06/11/2021: Takes when she is going out of town, lying flat to sleep  ? ondansetron (ZOFRAN) 4 MG tablet Take 1 tablet (4 mg total) by mouth every 8 (eight) hours as needed. (Patient not taking: Reported on 06/11/2021) 06/11/2021: For post op  ? PREMARIN vaginal cream  (Patient not taking: Reported on 07/23/2021) 09/13/2020: Only using it once a week, peri-urethrally only ?  ? PROAIR HFA 108 (90 Base) MCG/ACT inhaler  (Patient not taking: Reported on 06/11/2021) 09/13/2020: Uses prn, usually just when ill  ? [DISCONTINUED] co-enzyme Q-10 30 MG capsule Take 30 mg by mouth 3 (three) times daily. (Patient not taking: Reported on 06/11/2021)   ? [DISCONTINUED] levothyroxine (SYNTHROID) 100 MCG  tablet Take 100 mcg by mouth daily.   ? [DISCONTINUED] tapentadol (NUCYNTA) 50 MG tablet Take 1 tablet (50 mg total) by mouth every 6 (six) hours as needed.   ? ?No facility-administered encounter medications on file as of 07/23/2021.  ? ?Allergies  ?Allergen Reactions  ? Codeine Phosphate Nausea And Vomiting  ? Latex Itching  ?  senitivity  ? ?ROS:  no fever, chills, URI symptoms. Still has some foot pain, improving. No longer feels dizzy or off-balance.  Anxiety is improved. GI complaints improved, needing less pain meds (tramadol at night). ? ?  ?Observations/Objective: ? ?BP 121/80   Pulse 72   Temp (!) 97.3 ?F (36.3 ?C) (Oral)   Ht '5\' 2"'$  (1.575 m)   Wt 133 lb (60.3 kg)   LMP 01/04/2005   BMI 24.33 kg/m?  ? ?Well-appearing, pleasant female. ?She is alert and oriented, in good spirits. ?She  has normal mood, affect, hygiene, grooming, speech, eye contact. ? ?GAD 7 : Generalized Anxiety Score 07/23/2021 06/11/2021 01/10/2021 10/12/2020  ?Nervous, Anxious, on Edge '1 2 2 3  '$ ?Control/stop worrying 0 '1 3 1  '$ ?Worry too much - different things 0 2 0 1  ?Trouble relaxing 0 2 0 1  ?Restless 0 1 1 0  ?Easily annoyed or irritable '1 2 1 3  '$ ?Afraid - awful might happen 0 0 1 1  ?Total GAD 7 Score '2 10 8 10  '$ ?Anxiety Difficulty - Somewhat difficult Somewhat difficult -  ? ?Somewhat easily irritated by her husband not helping as much as she would like since her surgery. Only felt anxious the day she had to go to the doctor. ? ? ? ?Assessment and Plan: ? ?Generalized anxiety disorder - improved since not caring for her parents currently. Encouraged her to cont to rely on sisters to help with the burden. Re-try buspar if anxiety worsens ? ?Postablative hypothyroidism - last TSH borderline, dose shouldn't have been lowered. Pt to address with her endo, suspect dose change was error. ? ?She starts counseling tomorrow. ?Discussed that the concern would be increased levels of anxiety as she accepts back more responsibility for caring  for he parents.  Discussed having her two sisters continue to help, even if they aren't doing things the way she might do it.  Can further discuss with therapist. ?If anxiety worsens, can re-try buspar (can start at

## 2021-07-23 ENCOUNTER — Telehealth (INDEPENDENT_AMBULATORY_CARE_PROVIDER_SITE_OTHER): Payer: Medicare Other | Admitting: Family Medicine

## 2021-07-23 ENCOUNTER — Other Ambulatory Visit: Payer: Self-pay

## 2021-07-23 ENCOUNTER — Encounter: Payer: Self-pay | Admitting: Family Medicine

## 2021-07-23 VITALS — BP 121/80 | HR 72 | Temp 97.3°F | Ht 62.0 in | Wt 133.0 lb

## 2021-07-23 DIAGNOSIS — F411 Generalized anxiety disorder: Secondary | ICD-10-CM

## 2021-07-23 DIAGNOSIS — E890 Postprocedural hypothyroidism: Secondary | ICD-10-CM

## 2021-07-23 DIAGNOSIS — Z6379 Other stressful life events affecting family and household: Secondary | ICD-10-CM

## 2021-07-23 NOTE — Patient Instructions (Addendum)
?  You do not need to restart the Buspar at this moment, since anxiety is much better controlled. ?I am glad to see that you will start with therapy. ?If your anxiety worsens, I'd like for you to re-try the buspar.  Feel free to start at 1/2 tablet twice daily, and increase as tolerated. Stay at the lowest-effective dose. ? ?Please contact Dr. Ronnie Derby office regarding your thyroid dose. ?

## 2021-07-24 DIAGNOSIS — F4381 Prolonged grief disorder: Secondary | ICD-10-CM | POA: Diagnosis not present

## 2021-07-24 DIAGNOSIS — F4323 Adjustment disorder with mixed anxiety and depressed mood: Secondary | ICD-10-CM | POA: Diagnosis not present

## 2021-07-24 DIAGNOSIS — F439 Reaction to severe stress, unspecified: Secondary | ICD-10-CM | POA: Diagnosis not present

## 2021-07-25 ENCOUNTER — Other Ambulatory Visit: Payer: Self-pay | Admitting: Endocrinology

## 2021-07-25 MED ORDER — LEVOTHYROXINE SODIUM 100 MCG PO TABS: ORAL_TABLET | ORAL | 1 refills | Status: DC

## 2021-07-26 ENCOUNTER — Ambulatory Visit (INDEPENDENT_AMBULATORY_CARE_PROVIDER_SITE_OTHER): Payer: Medicare Other

## 2021-07-26 ENCOUNTER — Ambulatory Visit (INDEPENDENT_AMBULATORY_CARE_PROVIDER_SITE_OTHER): Payer: Medicare Other | Admitting: Podiatry

## 2021-07-26 ENCOUNTER — Other Ambulatory Visit: Payer: Self-pay

## 2021-07-26 ENCOUNTER — Encounter: Payer: Self-pay | Admitting: Podiatry

## 2021-07-26 DIAGNOSIS — M205X2 Other deformities of toe(s) (acquired), left foot: Secondary | ICD-10-CM | POA: Diagnosis not present

## 2021-07-26 DIAGNOSIS — Z9889 Other specified postprocedural states: Secondary | ICD-10-CM

## 2021-07-26 NOTE — Progress Notes (Signed)
She presents today postop visit #3 date of surgery 06/29/2021 left foot Keller arthroplasty single silicone implant.  She states that it feels good it is still little swollen so still tender. ? ?Objective: Vital signs are stable she is alert and oriented x3.  Pulses are palpable.  She has sort of a stiff range of motion of the first metatarsophalangeal joint sutures are intact margins are well coapted I will remove the sutures today margins remain well coapted.  Radiographs taken today demonstrate a Keller arthroplasty single silicone implant. ? ?Assessment: Well-healing surgical foot. ? ?Plan: Encouraged range of motion exercises and allow her to start getting this wet.  Encouraged her to try to get into a regular tennis shoe and I will follow-up with her in about 2 weeks. ?

## 2021-07-27 DIAGNOSIS — N3941 Urge incontinence: Secondary | ICD-10-CM | POA: Diagnosis not present

## 2021-07-31 DIAGNOSIS — F439 Reaction to severe stress, unspecified: Secondary | ICD-10-CM | POA: Diagnosis not present

## 2021-07-31 DIAGNOSIS — F4323 Adjustment disorder with mixed anxiety and depressed mood: Secondary | ICD-10-CM | POA: Diagnosis not present

## 2021-07-31 DIAGNOSIS — F4381 Prolonged grief disorder: Secondary | ICD-10-CM | POA: Diagnosis not present

## 2021-08-07 DIAGNOSIS — F439 Reaction to severe stress, unspecified: Secondary | ICD-10-CM | POA: Diagnosis not present

## 2021-08-07 DIAGNOSIS — F4323 Adjustment disorder with mixed anxiety and depressed mood: Secondary | ICD-10-CM | POA: Diagnosis not present

## 2021-08-07 DIAGNOSIS — F4381 Prolonged grief disorder: Secondary | ICD-10-CM | POA: Diagnosis not present

## 2021-08-09 ENCOUNTER — Encounter: Payer: Self-pay | Admitting: Podiatry

## 2021-08-09 ENCOUNTER — Ambulatory Visit (INDEPENDENT_AMBULATORY_CARE_PROVIDER_SITE_OTHER): Payer: Medicare Other | Admitting: Podiatry

## 2021-08-09 ENCOUNTER — Ambulatory Visit (INDEPENDENT_AMBULATORY_CARE_PROVIDER_SITE_OTHER): Payer: Medicare Other

## 2021-08-09 DIAGNOSIS — M205X2 Other deformities of toe(s) (acquired), left foot: Secondary | ICD-10-CM

## 2021-08-09 DIAGNOSIS — Z9889 Other specified postprocedural states: Secondary | ICD-10-CM

## 2021-08-11 NOTE — Progress Notes (Signed)
Lotter presents today for follow-up of her Jake Michaelis arthroplasty single silicone implant date of surgery June 29, 2021 states that is just swelling still but otherwise it really does not hurt that much still continues to take ibuprofen when it does. ? ?Objective: Vital signs are stable alert and oriented x3.  She has a great range of motion of the first metatarsophalangeal joint.  But it is moderately stiff on the top end. ? ?Assessment: Well-healing surgical foot. ? ?Plan: At this point I think we will go to physical therapy to see if we can loosen that and reduce the edema. ?

## 2021-08-14 ENCOUNTER — Ambulatory Visit: Payer: Medicare Other | Attending: Podiatry

## 2021-08-14 DIAGNOSIS — Z9889 Other specified postprocedural states: Secondary | ICD-10-CM | POA: Insufficient documentation

## 2021-08-14 DIAGNOSIS — M79672 Pain in left foot: Secondary | ICD-10-CM | POA: Insufficient documentation

## 2021-08-14 DIAGNOSIS — M6281 Muscle weakness (generalized): Secondary | ICD-10-CM | POA: Insufficient documentation

## 2021-08-14 DIAGNOSIS — M205X2 Other deformities of toe(s) (acquired), left foot: Secondary | ICD-10-CM | POA: Diagnosis not present

## 2021-08-14 DIAGNOSIS — Z4889 Encounter for other specified surgical aftercare: Secondary | ICD-10-CM | POA: Insufficient documentation

## 2021-08-14 DIAGNOSIS — M25675 Stiffness of left foot, not elsewhere classified: Secondary | ICD-10-CM | POA: Insufficient documentation

## 2021-08-14 DIAGNOSIS — R2689 Other abnormalities of gait and mobility: Secondary | ICD-10-CM | POA: Diagnosis not present

## 2021-08-14 NOTE — Therapy (Signed)
?OUTPATIENT PHYSICAL THERAPY LOWER EXTREMITY EVALUATION ? ? ?Patient Name: Theresa Schneider ?MRN: [id number] ?DOB:18-May-1955, 66 y.o., female ?Today's Date: 08/14/2021 ? ? PT End of Session - 08/14/21 1246   ? ? Visit Number 1   ? Date for PT Re-Evaluation 10/09/21   ? Authorization Type Medicare A and B   ? Progress Note Due on Visit 10   ? PT Start Time 1230   ? PT Stop Time 1315   ? PT Time Calculation (min) 45 min   ? ?  ?  ? ?  ? ? ?Past Medical History:  ?Diagnosis Date  ? Allergy   ? SEASONAL/PERENNIAL  ? Anxiety   ? Asthma   ? allergy related  ? Depression   ? GAD (generalized anxiety disorder)   ? Gastritis 2013  ? from NSAID's after foot surgery  ? Hallux rigidus   ? Hyperthyroidism   ?  hx graves disease. dr Demetrios Isaacs pcp     ? Hypothyroidism   ? (since I-131 treatment)  ? Osteoporosis 2009  ? Tendinitis of right ankle 2007  ? ?Past Surgical History:  ?Procedure Laterality Date  ? BREAST EXCISIONAL BIOPSY Right 1994  ? benign   ? BREAST SURGERY    ? fibroadenoma, right  ? hallux rigidus Right 11/06/2011  ? keller arthroplasty Left 06/29/2021  ? for hallux limitus. Dr. Milinda Pointer  ? ROTATOR CUFF REPAIR    ? right  ? TONSILLECTOMY    ? ?Patient Active Problem List  ? Diagnosis Date Noted  ? Allergic rhinitis 04/17/2021  ? Allergic rhinitis due to animal (cat) (dog) hair and dander 04/17/2021  ? Allergic rhinitis due to pollen 04/17/2021  ? Chronic allergic conjunctivitis 04/17/2021  ? Mild persistent asthma, uncomplicated 75/17/0017  ? Pure hypercholesterolemia 09/14/2019  ? OAB (overactive bladder) 09/13/2019  ? Stress due to illness of family member 07/14/2017  ? Family history of premature CAD 02/03/2013  ? Postablative hypothyroidism 02/20/2011  ? Osteoporosis 02/20/2011  ? Anxiety state, unspecified 02/20/2011  ? TIBIALIS TENDINITIS 07/14/2007  ? OTHER ENTHESOPATHY OF ANKLE AND TARSUS 07/14/2007  ? ? ?PCP: Rita Ohara, MD ? ?REFERRING PROVIDER: Garrel Ridgel, DPM ? ?REFERRING DIAG: M20.5X2 (ICD-10-CM) - Hallux  limitus of left foot Z98.890 (ICD-10-CM) - Status post left foot surgery  ? ?THERAPY DIAG:  ?Pain in left foot ? ?Other abnormalities of gait and mobility ? ?Stiffness of left foot, not elsewhere classified ? ?Muscle weakness (generalized) ? ?ONSET DATE: 06-29-21 ? ?SUBJECTIVE:  ? ?SUBJECTIVE STATEMENT: ?Patient states she had left foot pain for a while.  She was an avid walker and ended up having to stop walking due to the pain.  She had surgery on 06-29-21 for hallux rigidus.  She states since surgery, she has been very compliant with wearing her shoe and has not had to take anything but Tylenol or Advil.  She was taking Tramadol to sleep but no longer needs this.  She hopes to resume her prior level of activity including walking regimen.   ? ?PERTINENT HISTORY: ?See above ? ?PAIN:  ?Are you having pain? Yes: NPRS scale: 0/10 ?Pain location: no pain today, "just feels weird", if I do too much, it hurts as much as 5/10 ?Pain description: throbbing if up on it too long ?Aggravating factors: standing/walking ?Relieving factors: elevation ? ?PRECAUTIONS: None ? ?WEIGHT BEARING RESTRICTIONS No ? ?FALLS:  ?Has patient fallen in last 6 months? No ? ?LIVING ENVIRONMENT: ?Lives with: lives with their spouse ?Lives in: House/apartment ?  Stairs: Yes: Internal: 12 steps; can reach both and External: 5 steps; can reach both ?Has following equipment at home: None ? ?OCCUPATION: retired/ works part time from home; cares for her spouse at home who had throat cancer (feeding tube) ? ?PLOF: Independent ? ?PATIENT GOALS  Would like to be able to wear normal shoes and be able to resume her walking regimen.   ? ? ?OBJECTIVE:  ? ?DIAGNOSTIC FINDINGS: na ? ?PATIENT SURVEYS:  ?FOTO 58 ? ?COGNITION: ? Overall cognitive status: Within functional limits for tasks assessed   ?  ?SENSATION: ?WFL ? ? ?POSTURE:  ?normal ? ?PALPATION: ?Decreased sensation and hypersensation around incision ? ?LE ROM: ?Left great toe: ext: 10 degrees, flexion, 20  degrees, ? ? ?LE MMT: ? ?Generally 4 to 4+/5 ? ? ? ?GAIT: ?Distance walked: 50 ?Assistive device utilized: None ?Level of assistance: Complete Independence ?Comments: Still using hard shoe, has not been able to fully transition to normal shoe.   ? ? ? ?TODAY'S TREATMENT: ?Initial eval completed and initiated HEP ? ? ?PATIENT EDUCATION:  ?Education details: Initiated HEP ?Person educated: Patient ?Education method: Explanation, Demonstration, and Verbal cues ?Education comprehension: verbalized understanding, returned demonstration, and verbal cues required ? ? ?HOME EXERCISE PROGRAM: ?Seated great toe manual flexion and extension,  ankle alphabet A-Z x 2 : all 2 times per day ? ?ASSESSMENT: ? ?CLINICAL IMPRESSION: ?Patient is a 66 y.o. female who was seen today for physical therapy evaluation and treatment for post op great toe surgery for Hallux rigidus. She is currently still wearing hard shoe but has been cleared to wear normal shoes as she feels comfortable.  She states the foot becomes quite swollen in normal shoes including sneakers. She has attempted wearing her friends larger size sneakers and this has helped and these are more comfortable.  She has some ROM limitations post op in great toe flexion and extension.  Ankle ROM WFL but she would benefit from stretching for ankle DF to allow for improve toe off.  She is overall, quite fit and should progress easily.  She would benefit from ankle and foot ROM, strength and stability training as well as gait training and proprioception.   ? ? ?OBJECTIVE IMPAIRMENTS Abnormal gait, decreased balance, difficulty walking, decreased ROM, decreased strength, increased fascial restrictions, impaired flexibility, impaired sensation, and pain.  ? ?ACTIVITY LIMITATIONS cleaning, community activity, meal prep, laundry, yard work, and shopping.  ? ?PERSONAL FACTORS Fitness are also affecting patient's functional outcome.  ? ? ?REHAB POTENTIAL: Excellent ? ?CLINICAL DECISION  MAKING: Stable/uncomplicated ? ?EVALUATION COMPLEXITY: Low ? ? ?GOALS: ?Goals reviewed with patient? Yes ? ?SHORT TERM GOALS: Target date: 09/11/2021 ? ?Patient will be independent with initial HEP  ?Baseline: ?Goal status: INITIAL ? ?2.  Pain report to be no greater than 4/10  ?Baseline:  ?Goal status: INITIAL ? ?3.  Patient to be able to transition to normal footwear ?Baseline:  ?Goal status: INITIAL ? ? ?LONG TERM GOALS: Target date: 10/09/2021 ? ?Patient to be independent with advanced HEP  ?Baseline:  ?Goal status: INITIAL ? ?2.  Patient to report pain no greater than 2/10  ?Baseline:  ?Goal status: INITIAL ? ?3.  Patient to be able to resume normal footwear consistently and be able to wear low heel dress shoe.  ?Baseline:  ?Goal status: INITIAL ? ?4.  Patient to demonstrate normal heel to toe progression on involved LE ?Baseline:  ?Goal status: INITIAL ? ?5.  Patient to be able to walk 1 mile painfree in  normal shoes ?Baseline:  ?Goal status: INITIAL ? ? ? ? ?PLAN: ?PT FREQUENCY: 1-2x/week ? ?PT DURATION: 8 weeks ? ?PLANNED INTERVENTIONS: Therapeutic exercises, Therapeutic activity, Neuromuscular re-education, Balance training, Gait training, Patient/Family education, Joint mobilization, Stair training, Aquatic Therapy, Dry Needling, Electrical stimulation, Cryotherapy, Moist heat, scar mobilization, Splintting, Taping, Ionotophoresis '4mg'$ /ml Dexamethasone, and Manual therapy ? ?PLAN FOR NEXT SESSION: Review HEP, begin manual techniques for ROM and scar tissue mobility ? ? ?Anderson Malta B. Ame Heagle, PT ?08/14/21 5:29 PM  ?

## 2021-08-16 ENCOUNTER — Ambulatory Visit: Payer: Medicare Other

## 2021-08-16 DIAGNOSIS — M205X2 Other deformities of toe(s) (acquired), left foot: Secondary | ICD-10-CM | POA: Diagnosis not present

## 2021-08-16 DIAGNOSIS — Z9889 Other specified postprocedural states: Secondary | ICD-10-CM | POA: Diagnosis not present

## 2021-08-16 DIAGNOSIS — R2689 Other abnormalities of gait and mobility: Secondary | ICD-10-CM | POA: Diagnosis not present

## 2021-08-16 DIAGNOSIS — M25675 Stiffness of left foot, not elsewhere classified: Secondary | ICD-10-CM | POA: Diagnosis not present

## 2021-08-16 DIAGNOSIS — Z4889 Encounter for other specified surgical aftercare: Secondary | ICD-10-CM | POA: Diagnosis not present

## 2021-08-16 DIAGNOSIS — M79672 Pain in left foot: Secondary | ICD-10-CM

## 2021-08-16 DIAGNOSIS — M6281 Muscle weakness (generalized): Secondary | ICD-10-CM

## 2021-08-16 NOTE — Therapy (Signed)
?OUTPATIENT PHYSICAL THERAPY TREATMENT NOTE ? ? ?Patient Name: MADISEN LUDVIGSEN ?MRN: [id number] ?DOB:1955/09/28, 66 y.o., female ?Today's Date: 08/16/2021 ? ?PCP: Rita Ohara, MD ?REFERRING PROVIDER: Garrel Ridgel, DPM ? ?END OF SESSION:  ? PT End of Session - 08/16/21 4332   ? ? Visit Number 2   ? Date for PT Re-Evaluation 10/09/21   ? Authorization Type Medicare A and B   ? Progress Note Due on Visit 10   ? PT Start Time 0930   ? PT Stop Time 1015   ? PT Time Calculation (min) 45 min   ? Activity Tolerance Patient tolerated treatment well   ? Behavior During Therapy [facility] for tasks assessed/performed   ? ?  ?  ? ?  ? ? ?Past Medical History:  ?Diagnosis Date  ? Allergy   ? SEASONAL/PERENNIAL  ? Anxiety   ? Asthma   ? allergy related  ? Depression   ? GAD (generalized anxiety disorder)   ? Gastritis 2013  ? from NSAID's after foot surgery  ? Hallux rigidus   ? Hyperthyroidism   ?  hx graves disease. dr Demetrios Isaacs pcp     ? Hypothyroidism   ? (since I-131 treatment)  ? Osteoporosis 2009  ? Tendinitis of right ankle 2007  ? ?Past Surgical History:  ?Procedure Laterality Date  ? BREAST EXCISIONAL BIOPSY Right 1994  ? benign   ? BREAST SURGERY    ? fibroadenoma, right  ? hallux rigidus Right 11/06/2011  ? keller arthroplasty Left 06/29/2021  ? for hallux limitus. Dr. Milinda Pointer  ? ROTATOR CUFF REPAIR    ? right  ? TONSILLECTOMY    ? ?Patient Active Problem List  ? Diagnosis Date Noted  ? Allergic rhinitis 04/17/2021  ? Allergic rhinitis due to animal (cat) (dog) hair and dander 04/17/2021  ? Allergic rhinitis due to pollen 04/17/2021  ? Chronic allergic conjunctivitis 04/17/2021  ? Mild persistent asthma, uncomplicated 95/18/8416  ? Pure hypercholesterolemia 09/14/2019  ? OAB (overactive bladder) 09/13/2019  ? Stress due to illness of family member 07/14/2017  ? Family history of premature CAD 02/03/2013  ? Postablative hypothyroidism 02/20/2011  ? Osteoporosis 02/20/2011  ? Anxiety state, unspecified 02/20/2011  ? TIBIALIS TENDINITIS  07/14/2007  ? OTHER ENTHESOPATHY OF ANKLE AND TARSUS 07/14/2007  ? ? ?REFERRING DIAG: M20.5X2 (ICD-10-CM) - Hallux limitus of left foot Z98.890 (ICD-10-CM) - Status post left foot surgery  ? ?THERAPY DIAG:  ?Pain in left foot ? ?Other abnormalities of gait and mobility ? ?Stiffness of left foot, not elsewhere classified ? ?Muscle weakness (generalized) ? ?PERTINENT HISTORY: See above  ? ?PRECAUTIONS: none ? ?SUBJECTIVE: "It swells and its hard to bend my toe" ? ?PAIN:  ?Are you having pain? Yes: NPRS scale: 2/10 ?Pain location: left great toe ?Pain description: aching ?Aggravating factors: walking ?Relieving factors: rest ? ? ? ? ? ?OBJECTIVE:  ?  ?DIAGNOSTIC FINDINGS: na ?  ?PATIENT SURVEYS:  ?FOTO 61 ?  ?COGNITION: ?          Overall cognitive status: Within functional limits for tasks assessed               ?           ?SENSATION: ?WFL ?  ?  ?POSTURE:  ?normal ?  ?PALPATION: ?Decreased sensation and hypersensation around incision ?  ?LE ROM: ?Left great toe: ext: 10 degrees, flexion, 20 degrees, ?  ?  ?LE MMT: ?  ?Generally 4 to 4+/5 ?  ?  ?  ?  GAIT: ?Distance walked: 50 ?Assistive device utilized: None ?Level of assistance: Complete Independence ?Comments: Still using hard shoe, has not been able to fully transition to normal shoe.   ?  ?  ?  ?TODAY'S TREATMENT: 08/16/21 ?Ankle alphabet x 1 ?Rocker board DF/PF , INV/ EV x 20 each ?Seated toe and heel raises x 20 ?Seated towel push Inv/Ev ?Seated towel crunch with toes x 20 ?Seated marble pick up x 10 ?Rocker board x 2 min ?Gastroc stretch x 5 hold 10 sec ?Soleus stretch x 5 hold 10 sec (didn't feel much of a stretch) ?Left foot in chair: lunge fwd for ankle mobilization ?Tap up with right foot on bottom step for weight shift, then standing on balance pad x 10 each ?Step up with right hip flexion on balance pad x 10 hold 2-3 sec ?Gait training without assist or assistive device with hard shoe focusing on normal heel to toe progression, step length and gait  speed ?Educated patient on appropriate foot wear and to avoid prolonged use of the rigid shoe as this will limit her gaining ankle mobility and interfere with proprioceptive return.   ? ?TODAY'S TREATMENT: 08/14/21 ?Initial eval completed and initiated HEP ?  ?  ?PATIENT EDUCATION:  ?Education details: added new exercises to HEP (see below) ?Person educated: Patient ?Education method: Explanation, Demonstration, and Verbal cues ?Education comprehension: verbalized understanding, returned demonstration, and verbal cues required ?  ?  ?HOME EXERCISE PROGRAM: ?Access Code: [email or web address] ?URL: [email or web address] ?Date: 08/16/2021 ?Prepared by: Candyce Churn ? ?Exercises ?- Seated Toe Raise  - 1 x daily - 7 x weekly - 3 sets - 10 reps ?- Seated Heel Raise  - 1 x daily - 7 x weekly - 3 sets - 10 reps ?- Towel Scrunches  - 1 x daily - 7 x weekly - 3 sets - 10 reps ?- Ankle Inversion Eversion Towel Slide  - 1 x daily - 7 x weekly - 3 sets - 10 reps ?- Seated Marble Pick-Up with Toes  - 1 x daily - 7 x weekly - 3 sets - 10 reps ?- Seated Self Great Toe Stretch  - 1 x daily - 7 x weekly - 3 sets - 10 reps ?- Seated Ankle Alphabet  - 1 x daily - 7 x weekly - 3 sets - 10 reps ?  ?ASSESSMENT: ?  ?CLINICAL IMPRESSION: ?Patient arrives with hard shoe and limping slightly.  She is still quite reserved with transitioning out of the hard shoe and is limiting her outings. She becomes anxious that she will damage her foot.  Although MD has reassured her that she cannot hurt the foot at this point.  She was encouraged to wean away from hard shoe and into appropriate foot wear. She was able to tolerate all activities today including weight shifting without pain or loss of balance.  Gait training at end of session to encourage good heel to toe progression which she was able to do with minimal verbal cues with good step length and gait speed.   She would benefit from ankle and foot ROM, strength and stability training as well  as gait training and proprioception.   ?  ?  ?OBJECTIVE IMPAIRMENTS Abnormal gait, decreased balance, difficulty walking, decreased ROM, decreased strength, increased fascial restrictions, impaired flexibility, impaired sensation, and pain.  ?  ?ACTIVITY LIMITATIONS cleaning, community activity, meal prep, laundry, yard work, and shopping.  ?  ?PERSONAL FACTORS Fitness are also affecting patient's functional outcome.  ?  ?  ?  REHAB POTENTIAL: Excellent ?  ?CLINICAL DECISION MAKING: Stable/uncomplicated ?  ?EVALUATION COMPLEXITY: Low ?  ?  ?GOALS: ?Goals reviewed with patient? Yes ?  ?SHORT TERM GOALS: Target date: 09/11/2021 ?  ?Patient will be independent with initial HEP  ?Baseline: ?Goal status: INITIAL ?  ?2.  Pain report to be no greater than 4/10  ?Baseline:  ?Goal status: INITIAL ?  ?3.  Patient to be able to transition to normal footwear ?Baseline:  ?Goal status: INITIAL ?  ?  ?LONG TERM GOALS: Target date: 10/09/2021 ?  ?Patient to be independent with advanced HEP  ?Baseline:  ?Goal status: INITIAL ?  ?2.  Patient to report pain no greater than 2/10  ?Baseline:  ?Goal status: INITIAL ?  ?3.  Patient to be able to resume normal footwear consistently and be able to wear low heel dress shoe.  ?Baseline:  ?Goal status: INITIAL ?  ?4.  Patient to demonstrate normal heel to toe progression on involved LE ?Baseline:  ?Goal status: INITIAL ?  ?5.  Patient to be able to walk 1 mile painfree in normal shoes ?Baseline:  ?Goal status: INITIAL ?  ?  ?  ?  ?PLAN: ?PT FREQUENCY: 1-2x/week ?  ?PT DURATION: 8 weeks ?  ?PLANNED INTERVENTIONS: Therapeutic exercises, Therapeutic activity, Neuromuscular re-education, Balance training, Gait training, Patient/Family education, Joint mobilization, Stair training, Aquatic Therapy, Dry Needling, Electrical stimulation, Cryotherapy, Moist heat, scar mobilization, Splintting, Taping, Ionotophoresis '4mg'$ /ml Dexamethasone, and Manual therapy ?  ?PLAN FOR NEXT SESSION: Review HEP, begin manual  techniques for ROM and scar tissue mobility ? ? ?Anderson Malta B. Cythina Mickelsen, PT ?08/16/21 10:27 AM  ? ?   ?

## 2021-08-17 DIAGNOSIS — F439 Reaction to severe stress, unspecified: Secondary | ICD-10-CM | POA: Diagnosis not present

## 2021-08-17 DIAGNOSIS — F4323 Adjustment disorder with mixed anxiety and depressed mood: Secondary | ICD-10-CM | POA: Diagnosis not present

## 2021-08-17 DIAGNOSIS — F4381 Prolonged grief disorder: Secondary | ICD-10-CM | POA: Diagnosis not present

## 2021-08-21 ENCOUNTER — Ambulatory Visit: Payer: Medicare Other | Admitting: Physical Therapy

## 2021-08-21 DIAGNOSIS — M79672 Pain in left foot: Secondary | ICD-10-CM

## 2021-08-21 DIAGNOSIS — R2689 Other abnormalities of gait and mobility: Secondary | ICD-10-CM

## 2021-08-21 DIAGNOSIS — M25675 Stiffness of left foot, not elsewhere classified: Secondary | ICD-10-CM

## 2021-08-21 DIAGNOSIS — M6281 Muscle weakness (generalized): Secondary | ICD-10-CM

## 2021-08-21 DIAGNOSIS — Z4889 Encounter for other specified surgical aftercare: Secondary | ICD-10-CM | POA: Diagnosis not present

## 2021-08-21 DIAGNOSIS — M205X2 Other deformities of toe(s) (acquired), left foot: Secondary | ICD-10-CM | POA: Diagnosis not present

## 2021-08-21 DIAGNOSIS — N3941 Urge incontinence: Secondary | ICD-10-CM | POA: Diagnosis not present

## 2021-08-21 DIAGNOSIS — Z9889 Other specified postprocedural states: Secondary | ICD-10-CM | POA: Diagnosis not present

## 2021-08-21 NOTE — Therapy (Signed)
?OUTPATIENT PHYSICAL THERAPY TREATMENT NOTE ? ? ?Patient Name: Theresa Schneider ?MRN: [id number] ?DOB:[birth year], 66 y.o., female ?Today's Date: 08/21/2021 ? ?PCP: Rita Ohara, MD ?REFERRING PROVIDER: Garrel Ridgel, DPM ? ?END OF SESSION:  ? PT End of Session - 08/21/21 1433   ? ? Visit Number 3   ? Date for PT Re-Evaluation 10/09/21   ? Authorization Type Medicare A and B   ? Progress Note Due on Visit 10   ? PT Start Time 1761   ? PT Stop Time 1525   ? PT Time Calculation (min) 40 min   ? Activity Tolerance Patient tolerated treatment well   ? ?  ?  ? ?  ? ? ?Past Medical History:  ?Diagnosis Date  ? Allergy   ? SEASONAL/PERENNIAL  ? Anxiety   ? Asthma   ? allergy related  ? Depression   ? GAD (generalized anxiety disorder)   ? Gastritis 2013  ? from NSAID's after foot surgery  ? Hallux rigidus   ? Hyperthyroidism   ?  hx graves disease. dr Demetrios Isaacs pcp     ? Hypothyroidism   ? (since I-131 treatment)  ? Osteoporosis 2009  ? Tendinitis of right ankle 2007  ? ?Past Surgical History:  ?Procedure Laterality Date  ? BREAST EXCISIONAL BIOPSY Right 1994  ? benign   ? BREAST SURGERY    ? fibroadenoma, right  ? hallux rigidus Right 11/06/2011  ? keller arthroplasty Left 06/29/2021  ? for hallux limitus. Dr. Milinda Pointer  ? ROTATOR CUFF REPAIR    ? right  ? TONSILLECTOMY    ? ?Patient Active Problem List  ? Diagnosis Date Noted  ? Allergic rhinitis 04/17/2021  ? Allergic rhinitis due to animal (cat) (dog) hair and dander 04/17/2021  ? Allergic rhinitis due to pollen 04/17/2021  ? Chronic allergic conjunctivitis 04/17/2021  ? Mild persistent asthma, uncomplicated 60/73/7106  ? Pure hypercholesterolemia 09/14/2019  ? OAB (overactive bladder) 09/13/2019  ? Stress due to illness of family member 07/14/2017  ? Family history of premature CAD 02/03/2013  ? Postablative hypothyroidism 02/20/2011  ? Osteoporosis 02/20/2011  ? Anxiety state, unspecified 02/20/2011  ? TIBIALIS TENDINITIS 07/14/2007  ? OTHER ENTHESOPATHY OF ANKLE AND TARSUS  07/14/2007  ? ? ?REFERRING DIAG: M20.5X2 (ICD-10-CM) - Hallux limitus of left foot Z98.890 (ICD-10-CM) - Status post left foot surgery  ? ?THERAPY DIAG:  ?Pain in left foot ? ?Other abnormalities of gait and mobility ? ?Stiffness of left foot, not elsewhere classified ? ?Muscle weakness (generalized) ? ?PERTINENT HISTORY: See above  ? ?PRECAUTIONS: none ? ?SUBJECTIVE:   New shoes from [employer].  Going to [facility] and then to [employer].    ? ?PAIN:  ?Are you having pain? Yes: NPRS scale: 2/10 ?Pain location: left great toe ?Pain description: aching ?Aggravating factors: walking ?Relieving factors: rest ? ? ? ? ? ?OBJECTIVE:  ?  ?DIAGNOSTIC FINDINGS: na ?  ?PATIENT SURVEYS:  ?FOTO 18 ?  ?COGNITION: ?          Overall cognitive status: Within functional limits for tasks assessed               ?           ?SENSATION: ?WFL ?  ?  ?POSTURE:  ?normal ?  ?PALPATION: ?Decreased sensation and hypersensation around incision ?  ?LE ROM: ?Left great toe: ext: 10 degrees, flexion, 20 degrees, ?  ?  ?LE MMT: ?  ?Generally 4 to 4+/5 ?  ?  ?  ?  GAIT: ?Distance walked: 50 ?Assistive device utilized: None ?Level of assistance: Complete Independence ?Comments: Still using hard shoe, has not been able to fully transition to normal shoe.   ?  ? TODAY'S TREATMENT: 08/21/21 ? ?Rocker board DF/PF , INV/ EV x 20 each ?Seated toe and heel raises x 20 ?Seated great toe flexion/extension 20x; abduction/adduction 10x ?Towel scrunch 5x  ?Floor slider 20x  ?Retro step1 min;  ?Church pew sways 1 min;  ?Rocker board x 2 min ?At the stairs Gastroc stretch x 5 hold 10 sec ?Tap up with right foot on 1st and 2nd  step for weight shift, then standing on balance pad x 10 each 1st and 2nd step taps ?Step up with left with right hip flexion  x 10  ?TODAY'S TREATMENT: 08/16/21 ?Ankle alphabet x 1 ?Rocker board DF/PF , INV/ EV x 20 each ?Seated toe and heel raises x 20 ?Seated towel push Inv/Ev ?Seated towel crunch with toes x 20 ?Seated marble pick  up x 10 ?Rocker board x 2 min ?Gastroc stretch x 5 hold 10 sec ?Soleus stretch x 5 hold 10 sec (didn't feel much of a stretch) ?Left foot in chair: lunge fwd for ankle mobilization ?Tap up with right foot on bottom step for weight shift, then standing on balance pad x 10 each ?Step up with right hip flexion on balance pad x 10 hold 2-3 sec ?Gait training without assist or assistive device with hard shoe focusing on normal heel to toe progression, step length and gait speed ?Educated patient on appropriate foot wear and to avoid prolonged use of the rigid shoe as this will limit her gaining ankle mobility and interfere with proprioceptive return.   ? ?TODAY'S TREATMENT: 08/14/21 ?Initial eval completed and initiated HEP ?  ?  ?PATIENT EDUCATION:  ?Education details: added new exercises to HEP (see below) ?Person educated: Patient ?Education method: Explanation, Demonstration, and Verbal cues ?Education comprehension: verbalized understanding, returned demonstration, and verbal cues required ?  ?  ?HOME EXERCISE PROGRAM: ? ? Access Code: [email or web address] ?URL: [email or web address] ?Date: 08/21/2021 ?Prepared by: Ruben Im ? ?Exercises ?- Seated Toe Raise  - 1 x daily - 7 x weekly - 3 sets - 10 reps ?- Seated Heel Raise  - 1 x daily - 7 x weekly - 3 sets - 10 reps ?- Towel Scrunches  - 1 x daily - 7 x weekly - 3 sets - 10 reps ?- Ankle Inversion Eversion Towel Slide  - 1 x daily - 7 x weekly - 3 sets - 10 reps ?- Seated Marble Pick-Up with Toes  - 1 x daily - 7 x weekly - 3 sets - 10 reps ?- Seated Self Great Toe Stretch  - 1 x daily - 7 x weekly - 3 sets - 10 reps ?- Seated Ankle Alphabet  - 1 x daily - 7 x weekly - 3 sets - 10 reps ?- Seated Self Great Toe Stretch  - 1 x daily - 7 x weekly - 1 sets - 5 reps - 20 hold ?- Big toe mobilization (Mirrored)  - 1 x daily - 7 x weekly - 1 sets - 10 reps ?- Church Pew  - 1 x daily - 7 x weekly - 1 sets - 10 reps ?- Retro Step  - 1 x daily - 7 x weekly - 1 sets - 10  reps ?ASSESSMENT: ?  ?CLINICAL IMPRESSION: ?The patient is improving with overall gait pattern with improved stance time symmetry on level surfaces.  Standing (and walking) on uneven surfaces may be more challenging secondary to decreased great toe mobility, decreased intrinsic strength and proprioception.  Treatment emphasis on great toe extension.  Therapist monitoring response and modifying treatment accordingly.  ?  ?OBJECTIVE IMPAIRMENTS Abnormal gait, decreased balance, difficulty walking, decreased ROM, decreased strength, increased fascial restrictions, impaired flexibility, impaired sensation, and pain.  ?  ?ACTIVITY LIMITATIONS cleaning, community activity, meal prep, laundry, yard work, and shopping.  ?  ?PERSONAL FACTORS Fitness are also affecting patient's functional outcome.  ?  ?  ?REHAB POTENTIAL: Excellent ?  ?CLINICAL DECISION MAKING: Stable/uncomplicated ?  ?EVALUATION COMPLEXITY: Low ?  ?  ?GOALS: ?Goals reviewed with patient? Yes ?  ?SHORT TERM GOALS: Target date: 09/11/2021 ?  ?Patient will be independent with initial HEP  ?Baseline: ?Goal status: INITIAL ?  ?2.  Pain report to be no greater than 4/10  ?Baseline:  ?Goal status: INITIAL ?  ?3.  Patient to be able to transition to normal footwear ?Baseline:  ?Goal status: INITIAL ?  ?  ?LONG TERM GOALS: Target date: 10/09/2021 ?  ?Patient to be independent with advanced HEP  ?Baseline:  ?Goal status: INITIAL ?  ?2.  Patient to report pain no greater than 2/10  ?Baseline:  ?Goal status: INITIAL ?  ?3.  Patient to be able to resume normal footwear consistently and be able to wear low heel dress shoe.  ?Baseline:  ?Goal status: INITIAL ?  ?4.  Patient to demonstrate normal heel to toe progression on involved LE ?Baseline:  ?Goal status: INITIAL ?  ?5.  Patient to be able to walk 1 mile painfree in normal shoes ?Baseline:  ?Goal status: INITIAL ?  ?  ?  ?  ?PLAN: ?PT FREQUENCY: 1-2x/week ?  ?PT DURATION: 8 weeks ?  ?PLANNED INTERVENTIONS: Therapeutic  exercises, Therapeutic activity, Neuromuscular re-education, Balance training, Gait training, Patient/Family education, Joint mobilization, Stair training, Aquatic Therapy, Dry Needling, Electrical stimulation,

## 2021-08-23 DIAGNOSIS — H5310 Unspecified subjective visual disturbances: Secondary | ICD-10-CM | POA: Diagnosis not present

## 2021-09-04 ENCOUNTER — Ambulatory Visit: Payer: Medicare Other | Attending: Podiatry

## 2021-09-04 DIAGNOSIS — M6281 Muscle weakness (generalized): Secondary | ICD-10-CM | POA: Insufficient documentation

## 2021-09-04 DIAGNOSIS — F439 Reaction to severe stress, unspecified: Secondary | ICD-10-CM | POA: Diagnosis not present

## 2021-09-04 DIAGNOSIS — F4323 Adjustment disorder with mixed anxiety and depressed mood: Secondary | ICD-10-CM | POA: Diagnosis not present

## 2021-09-04 DIAGNOSIS — M25675 Stiffness of left foot, not elsewhere classified: Secondary | ICD-10-CM | POA: Diagnosis not present

## 2021-09-04 DIAGNOSIS — F4381 Prolonged grief disorder: Secondary | ICD-10-CM | POA: Diagnosis not present

## 2021-09-04 DIAGNOSIS — R2689 Other abnormalities of gait and mobility: Secondary | ICD-10-CM | POA: Insufficient documentation

## 2021-09-04 DIAGNOSIS — M79672 Pain in left foot: Secondary | ICD-10-CM | POA: Insufficient documentation

## 2021-09-04 NOTE — Therapy (Signed)
?OUTPATIENT PHYSICAL THERAPY TREATMENT NOTE ? ? ?Patient Name: Theresa Schneider ?MRN: [id number] ?DOB:02/24/1956, 66 y.o., female ?Today's Date: 09/04/2021 ? ?PCP: Rita Ohara, MD ?REFERRING PROVIDER: Rita Ohara, MD ? ?END OF SESSION:  ? PT End of Session - 09/04/21 1233   ? ? Visit Number 4   ? Date for PT Re-Evaluation 10/09/21   ? Authorization Type Medicare A and B   ? Progress Note Due on Visit 10   ? PT Start Time 1233   ? PT Stop Time 1315   ? PT Time Calculation (min) 42 min   ? Activity Tolerance Patient tolerated treatment well   ? Behavior During Therapy [facility] for tasks assessed/performed   ? ?  ?  ? ?  ? ? ?Past Medical History:  ?Diagnosis Date  ? Allergy   ? SEASONAL/PERENNIAL  ? Anxiety   ? Asthma   ? allergy related  ? Depression   ? GAD (generalized anxiety disorder)   ? Gastritis 2013  ? from NSAID's after foot surgery  ? Hallux rigidus   ? Hyperthyroidism   ?  hx graves disease. dr Demetrios Isaacs pcp     ? Hypothyroidism   ? (since I-131 treatment)  ? Osteoporosis 2009  ? Tendinitis of right ankle 2007  ? ?Past Surgical History:  ?Procedure Laterality Date  ? BREAST EXCISIONAL BIOPSY Right 1994  ? benign   ? BREAST SURGERY    ? fibroadenoma, right  ? hallux rigidus Right 11/06/2011  ? keller arthroplasty Left 06/29/2021  ? for hallux limitus. Dr. Milinda Pointer  ? ROTATOR CUFF REPAIR    ? right  ? TONSILLECTOMY    ? ?Patient Active Problem List  ? Diagnosis Date Noted  ? Allergic rhinitis 04/17/2021  ? Allergic rhinitis due to animal (cat) (dog) hair and dander 04/17/2021  ? Allergic rhinitis due to pollen 04/17/2021  ? Chronic allergic conjunctivitis 04/17/2021  ? Mild persistent asthma, uncomplicated 59/16/3846  ? Pure hypercholesterolemia 09/14/2019  ? OAB (overactive bladder) 09/13/2019  ? Stress due to illness of family member 07/14/2017  ? Family history of premature CAD 02/03/2013  ? Postablative hypothyroidism 02/20/2011  ? Osteoporosis 02/20/2011  ? Anxiety state, unspecified 02/20/2011  ? TIBIALIS TENDINITIS  07/14/2007  ? OTHER ENTHESOPATHY OF ANKLE AND TARSUS 07/14/2007  ? ? ?REFERRING DIAG: M20.5X2 (ICD-10-CM) - Hallux limitus of left foot Z98.890 (ICD-10-CM) - Status post left foot surgery  ? ?THERAPY DIAG:  ?Pain in left foot ? ?Other abnormalities of gait and mobility ? ?Stiffness of left foot, not elsewhere classified ? ?Muscle weakness (generalized) ? ?PERTINENT HISTORY: See above  ? ?PRECAUTIONS: none ? ?SUBJECTIVE:   Patient states she did ok while out of town.  She wore shoes the entire time while gone.  She has done some walking without her shoes and even walked in the sand [place] the beach.   She admits her foot did swell some but it would subside.   ? ?PAIN:  ?Are you having pain? Yes: NPRS scale: 2/10 ?Pain location: left great toe ?Pain description: aching ?Aggravating factors: walking ?Relieving factors: rest ? ? ? ? ? ?OBJECTIVE:  ?  ?DIAGNOSTIC FINDINGS: na ?  ?PATIENT SURVEYS:  ?FOTO 24 ?  ?COGNITION: ?          Overall cognitive status: Within functional limits for tasks assessed               ?           ?SENSATION: ?WFL ?  ?  ?  POSTURE:  ?normal ?  ?PALPATION: ?Decreased sensation and hypersensation around incision ?  ?LE ROM: ?Left great toe: ext: 10 degrees, flexion, 20 degrees, ?  ?  ?LE MMT: ?  ?Generally 4 to 4+/5 ?  ?  ?  ?GAIT: ?Distance walked: 50 ?Assistive device utilized: None ?Level of assistance: Complete Independence ?Comments: Still using hard shoe, has not been able to fully transition to normal shoe.   ?  ?TODAY'S TREATMENT: 09/04/21 ? ?Rocker board DF/PF , INV/ EV x 20 each ?Seated toe and heel raises x 20 ?Seated  ?Seated great toe flexion/extension 20x; abduction/adduction 10x ?Towel scrunch 5x  ?Marble pick up x 20 marbles ?Cone touch x 20  in SLS (cone on table) ?1D ball toss x 20 (red plyo ball ?Step up on balance pad with right hip flexion for balance training on left fwd x 20, lateral x 20 ?Stair training ascending and descending with reciprocal gait x 1 ?Lunge to BOSU with left  foot x 20 fwd and lateral ?  ? TODAY'S TREATMENT: 08/21/21 ? ?Rocker board DF/PF , INV/ EV x 20 each ?Seated toe and heel raises x 20 ?Seated great toe flexion/extension 20x; abduction/adduction 10x ?Towel scrunch 5x  ?Floor slider 20x  ?Retro step1 min;  ?Church pew sways 1 min;  ?Rocker board x 2 min ?At the stairs Gastroc stretch x 5 hold 10 sec ?Tap up with right foot on 1st and 2nd  step for weight shift, then standing on balance pad x 10 each 1st and 2nd step taps ?Step up with left with right hip flexion  x 10  ?TODAY'S TREATMENT: 08/16/21 ?Ankle alphabet x 1 ?Rocker board DF/PF , INV/ EV x 20 each ?Seated toe and heel raises x 20 ?Seated towel push Inv/Ev ?Seated towel crunch with toes x 20 ?Seated marble pick up x 10 ?Rocker board x 2 min ?Gastroc stretch x 5 hold 10 sec ?Soleus stretch x 5 hold 10 sec (didn't feel much of a stretch) ?Left foot in chair: lunge fwd for ankle mobilization ?Tap up with right foot on bottom step for weight shift, then standing on balance pad x 10 each ?Step up with right hip flexion on balance pad x 10 hold 2-3 sec ?Gait training without assist or assistive device with hard shoe focusing on normal heel to toe progression, step length and gait speed ?Educated patient on appropriate foot wear and to avoid prolonged use of the rigid shoe as this will limit her gaining ankle mobility and interfere with proprioceptive return.   ? ?TODAY'S TREATMENT: 08/14/21 ?Initial eval completed and initiated HEP ?  ?  ?PATIENT EDUCATION:  ?Education details: added new exercises to HEP (see below) ?Person educated: Patient ?Education method: Explanation, Demonstration, and Verbal cues ?Education comprehension: verbalized understanding, returned demonstration, and verbal cues required ?  ?  ?HOME EXERCISE PROGRAM: ? ? Access Code: [email or web address] ?URL: [email or web address] ?Date: 08/21/2021 ?Prepared by: Ruben Im ? ?Exercises ?- Seated Toe Raise  - 1 x daily - 7 x weekly - 3 sets - 10  reps ?- Seated Heel Raise  - 1 x daily - 7 x weekly - 3 sets - 10 reps ?- Towel Scrunches  - 1 x daily - 7 x weekly - 3 sets - 10 reps ?- Ankle Inversion Eversion Towel Slide  - 1 x daily - 7 x weekly - 3 sets - 10 reps ?- Seated Marble Pick-Up with Toes  - 1 x daily - 7 x weekly - 3 sets -  10 reps ?- Seated Self Great Toe Stretch  - 1 x daily - 7 x weekly - 3 sets - 10 reps ?- Seated Ankle Alphabet  - 1 x daily - 7 x weekly - 3 sets - 10 reps ?- Seated Self Great Toe Stretch  - 1 x daily - 7 x weekly - 1 sets - 5 reps - 20 hold ?- Big toe mobilization (Mirrored)  - 1 x daily - 7 x weekly - 1 sets - 10 reps ?- Church Pew  - 1 x daily - 7 x weekly - 1 sets - 10 reps ?- Retro Step  - 1 x daily - 7 x weekly - 1 sets - 10 reps ?ASSESSMENT: ?  ?CLINICAL IMPRESSION: ?Patient is progressing appropriately.  She continues to have apprehension about walking long distances and is unable to wear her normal shoes.  However, she was able to do some very high level single leg balance activities today.  She should continue to improve.  She would benefit from continued skilled PT for normalizing gait.  ? ?OBJECTIVE IMPAIRMENTS Abnormal gait, decreased balance, difficulty walking, decreased ROM, decreased strength, increased fascial restrictions, impaired flexibility, impaired sensation, and pain.  ?  ?ACTIVITY LIMITATIONS cleaning, community activity, meal prep, laundry, yard work, and shopping.  ?  ?PERSONAL FACTORS Fitness are also affecting patient's functional outcome.  ?  ?  ?REHAB POTENTIAL: Excellent ?  ?CLINICAL DECISION MAKING: Stable/uncomplicated ?  ?EVALUATION COMPLEXITY: Low ?  ?  ?GOALS: ?Goals reviewed with patient? Yes ?  ?SHORT TERM GOALS: Target date: 09/11/2021 ?  ?Patient will be independent with initial HEP  ?Baseline: ?Goal status: INITIAL ?  ?2.  Pain report to be no greater than 4/10  ?Baseline:  ?Goal status: INITIAL ?  ?3.  Patient to be able to transition to normal footwear ?Baseline:  ?Goal status:  INITIAL ?  ?  ?LONG TERM GOALS: Target date: 10/09/2021 ?  ?Patient to be independent with advanced HEP  ?Baseline:  ?Goal status: INITIAL ?  ?2.  Patient to report pain no greater than 2/10  ?Baseline:  ?Goal status

## 2021-09-06 ENCOUNTER — Ambulatory Visit: Payer: Medicare Other

## 2021-09-06 DIAGNOSIS — R2689 Other abnormalities of gait and mobility: Secondary | ICD-10-CM

## 2021-09-06 DIAGNOSIS — M79672 Pain in left foot: Secondary | ICD-10-CM | POA: Diagnosis not present

## 2021-09-06 DIAGNOSIS — M25675 Stiffness of left foot, not elsewhere classified: Secondary | ICD-10-CM | POA: Diagnosis not present

## 2021-09-06 DIAGNOSIS — M6281 Muscle weakness (generalized): Secondary | ICD-10-CM

## 2021-09-06 NOTE — Therapy (Signed)
?OUTPATIENT PHYSICAL THERAPY TREATMENT NOTE ? ? ?Patient Name: Theresa Schneider ?MRN: [id number] ?DOB:23-Dec-1955, 66 y.o., female ?Today's Date: 09/06/2021 ? ?PCP: Rita Ohara, MD ?REFERRING PROVIDER: Rita Ohara, MD ? ?END OF SESSION:  ? PT End of Session - 09/06/21 0805   ? ? Visit Number 5   ? Date for PT Re-Evaluation 10/09/21   ? Authorization Type Medicare A and B   ? PT Start Time [phone]   ? PT Stop Time 0848   ? PT Time Calculation (min) 44 min   ? Activity Tolerance Patient tolerated treatment well   ? Behavior During Therapy [facility] for tasks assessed/performed   ? ?  ?  ? ?  ? ? ?Past Medical History:  ?Diagnosis Date  ? Allergy   ? SEASONAL/PERENNIAL  ? Anxiety   ? Asthma   ? allergy related  ? Depression   ? GAD (generalized anxiety disorder)   ? Gastritis 2013  ? from NSAID's after foot surgery  ? Hallux rigidus   ? Hyperthyroidism   ?  hx graves disease. dr Demetrios Isaacs pcp     ? Hypothyroidism   ? (since I-131 treatment)  ? Osteoporosis 2009  ? Tendinitis of right ankle 2007  ? ?Past Surgical History:  ?Procedure Laterality Date  ? BREAST EXCISIONAL BIOPSY Right 1994  ? benign   ? BREAST SURGERY    ? fibroadenoma, right  ? hallux rigidus Right 11/06/2011  ? keller arthroplasty Left 06/29/2021  ? for hallux limitus. Dr. Milinda Pointer  ? ROTATOR CUFF REPAIR    ? right  ? TONSILLECTOMY    ? ?Patient Active Problem List  ? Diagnosis Date Noted  ? Allergic rhinitis 04/17/2021  ? Allergic rhinitis due to animal (cat) (dog) hair and dander 04/17/2021  ? Allergic rhinitis due to pollen 04/17/2021  ? Chronic allergic conjunctivitis 04/17/2021  ? Mild persistent asthma, uncomplicated 11/91/4782  ? Pure hypercholesterolemia 09/14/2019  ? OAB (overactive bladder) 09/13/2019  ? Stress due to illness of family member 07/14/2017  ? Family history of premature CAD 02/03/2013  ? Postablative hypothyroidism 02/20/2011  ? Osteoporosis 02/20/2011  ? Anxiety state, unspecified 02/20/2011  ? TIBIALIS TENDINITIS 07/14/2007  ? OTHER ENTHESOPATHY OF  ANKLE AND TARSUS 07/14/2007  ? ? ?REFERRING DIAG: M20.5X2 (ICD-10-CM) - Hallux limitus of left foot Z98.890 (ICD-10-CM) - Status post left foot surgery  ? ?THERAPY DIAG:  ?Pain in left foot ? ?Other abnormalities of gait and mobility ? ?Stiffness of left foot, not elsewhere classified ? ?Muscle weakness (generalized) ? ?PERTINENT HISTORY: See above  ? ?PRECAUTIONS: none ? ?SUBJECTIVE:   Patient states she has started walking more.  Getting some stiffness but not really painful.   She reports her pain is 4/10.  ? ?PAIN:  ?Are you having pain? Yes: NPRS scale: 4/10 ?Pain location: left great toe ?Pain description: aching ?Aggravating factors: walking ?Relieving factors: rest ? ? ? ? ? ?OBJECTIVE:  ?  ?DIAGNOSTIC FINDINGS: na ?  ?PATIENT SURVEYS:  ?FOTO 18 ?  ?COGNITION: ?          Overall cognitive status: Within functional limits for tasks assessed               ?           ?SENSATION: ?WFL ?  ?  ?POSTURE:  ?normal ?  ?PALPATION: ?Decreased sensation and hypersensation around incision ?  ?LE ROM: ?Left great toe: ext: 10 degrees, flexion, 20 degrees, ?  ?  ?LE MMT: ?  ?Generally  4 to 4+/5 ?  ?  ?  ?GAIT: ?Distance walked: 50 ?Assistive device utilized: None ?Level of assistance: Complete Independence ?Comments: Still using hard shoe, has not been able to fully transition to normal shoe.   ?  ?TODAY'S TREATMENT: 09/06/21 ? ?Rocker board DF/PF , INV/ EV x 20 each ?Seated toe and heel raises x 20 ?Seated great toe flexion/extension 20x; abduction/adduction 10x ?Towel scrunch 20 reps ?Ankle inv/ev x 20 with towel  ?Marble pick up x 20 marbles ?Cone touch x 20  in SLS (cone on table) ?1D ball toss x 20 (red plyo ball ?Step up on balance pad with right hip flexion for balance training on left fwd x 20, lateral x 20 ?Lunge to BOSU with left foot x 20 fwd and lateral ?Manual ROM left great toe and all metatarsal joints along with left ankle mobility ? ?TODAY'S TREATMENT: 09/04/21 ? ?Rocker board DF/PF , INV/ EV x 20 each ?Seated  toe and heel raises x 20 ?Seated  ?Seated great toe flexion/extension 20x; abduction/adduction 10x ?Towel scrunch 5x  ?Marble pick up x 20 marbles ?Cone touch x 20  in SLS (cone on table) ?1D ball toss x 20 (red plyo ball ?Step up on balance pad with right hip flexion for balance training on left fwd x 20, lateral x 20 ?Stair training ascending and descending with reciprocal gait x 1 ?Lunge to BOSU with left foot x 20 fwd and lateral ?  ? TODAY'S TREATMENT: 08/21/21 ? ?Rocker board DF/PF , INV/ EV x 20 each ?Seated toe and heel raises x 20 ?Seated great toe flexion/extension 20x; abduction/adduction 10x ?Towel scrunch 5x  ?Floor slider 20x  ?Retro step1 min;  ?Church pew sways 1 min;  ?Rocker board x 2 min ?At the stairs Gastroc stretch x 5 hold 10 sec ?Tap up with right foot on 1st and 2nd  step for weight shift, then standing on balance pad x 10 each 1st and 2nd step taps ?Step up with left with right hip flexion  x 10  ?TODAY'S TREATMENT: 08/16/21 ?Ankle alphabet x 1 ?Rocker board DF/PF , INV/ EV x 20 each ?Seated toe and heel raises x 20 ?Seated towel push Inv/Ev ?Seated towel crunch with toes x 20 ?Seated marble pick up x 10 ?Rocker board x 2 min ?Gastroc stretch x 5 hold 10 sec ?Soleus stretch x 5 hold 10 sec (didn't feel much of a stretch) ?Left foot in chair: lunge fwd for ankle mobilization ?Tap up with right foot on bottom step for weight shift, then standing on balance pad x 10 each ?Step up with right hip flexion on balance pad x 10 hold 2-3 sec ?Gait training without assist or assistive device with hard shoe focusing on normal heel to toe progression, step length and gait speed ?Educated patient on appropriate foot wear and to avoid prolonged use of the rigid shoe as this will limit her gaining ankle mobility and interfere with proprioceptive return.   ? ?TODAY'S TREATMENT: 08/14/21 ?Initial eval completed and initiated HEP ?  ?  ?PATIENT EDUCATION:  ?Education details: added new exercises to HEP (see  below) ?Person educated: Patient ?Education method: Explanation, Demonstration, and Verbal cues ?Education comprehension: verbalized understanding, returned demonstration, and verbal cues required ?  ?  ?HOME EXERCISE PROGRAM: ? ? Access Code: [email or web address] ?URL: [email or web address] ?Date: 08/21/2021 ?Prepared by: Ruben Im ? ?Exercises ?- Seated Toe Raise  - 1 x daily - 7 x weekly - 3 sets - 10 reps ?- Seated Heel  Raise  - 1 x daily - 7 x weekly - 3 sets - 10 reps ?- Towel Scrunches  - 1 x daily - 7 x weekly - 3 sets - 10 reps ?- Ankle Inversion Eversion Towel Slide  - 1 x daily - 7 x weekly - 3 sets - 10 reps ?- Seated Marble Pick-Up with Toes  - 1 x daily - 7 x weekly - 3 sets - 10 reps ?- Seated Self Great Toe Stretch  - 1 x daily - 7 x weekly - 3 sets - 10 reps ?- Seated Ankle Alphabet  - 1 x daily - 7 x weekly - 3 sets - 10 reps ?- Seated Self Great Toe Stretch  - 1 x daily - 7 x weekly - 1 sets - 5 reps - 20 hold ?- Big toe mobilization (Mirrored)  - 1 x daily - 7 x weekly - 1 sets - 10 reps ?- Church Pew  - 1 x daily - 7 x weekly - 1 sets - 10 reps ?- Retro Step  - 1 x daily - 7 x weekly - 1 sets - 10 reps ?ASSESSMENT: ?  ?CLINICAL IMPRESSION: ?Patient continues to progress appropriately.  She is walking longer distances.  She continues to report stiffness and concern with stairs but she did more stair climbing yesterday without increased pain or swelling.   She would benefit from continued skilled PT for ROM, proper toe off to normalize gait and ankle stability training.  ? ?OBJECTIVE IMPAIRMENTS Abnormal gait, decreased balance, difficulty walking, decreased ROM, decreased strength, increased fascial restrictions, impaired flexibility, impaired sensation, and pain.  ?  ?ACTIVITY LIMITATIONS cleaning, community activity, meal prep, laundry, yard work, and shopping.  ?  ?PERSONAL FACTORS Fitness are also affecting patient's functional outcome.  ?  ?  ?REHAB POTENTIAL: Excellent ?  ?CLINICAL  DECISION MAKING: Stable/uncomplicated ?  ?EVALUATION COMPLEXITY: Low ?  ?  ?GOALS: ?Goals reviewed with patient? Yes ?  ?SHORT TERM GOALS: Target date: 09/11/2021 ?  ?Patient will be independent with initial

## 2021-09-11 ENCOUNTER — Ambulatory Visit: Payer: Medicare Other

## 2021-09-11 ENCOUNTER — Telehealth: Payer: Self-pay | Admitting: Podiatry

## 2021-09-11 DIAGNOSIS — M25675 Stiffness of left foot, not elsewhere classified: Secondary | ICD-10-CM | POA: Diagnosis not present

## 2021-09-11 DIAGNOSIS — M6281 Muscle weakness (generalized): Secondary | ICD-10-CM | POA: Diagnosis not present

## 2021-09-11 DIAGNOSIS — R2689 Other abnormalities of gait and mobility: Secondary | ICD-10-CM

## 2021-09-11 DIAGNOSIS — M79672 Pain in left foot: Secondary | ICD-10-CM | POA: Diagnosis not present

## 2021-09-11 NOTE — Therapy (Signed)
?OUTPATIENT PHYSICAL THERAPY TREATMENT NOTE ? ? ?Patient Name: Theresa Schneider ?MRN: [id number] ?DOB:Jul 20, 1955, 66 y.o., female ?Today's Date: 09/11/2021 ? ?PCP: Rita Ohara, MD ?REFERRING PROVIDER: Garrel Ridgel, DPM ? ?END OF SESSION:  ? PT End of Session - 09/11/21 0800   ? ? Visit Number 6   ? Date for PT Re-Evaluation 10/09/21   ? Authorization Type Medicare A and B   ? Progress Note Due on Visit 10   ? PT Start Time 0800   ? PT Stop Time 0845   ? PT Time Calculation (min) 45 min   ? Activity Tolerance Patient tolerated treatment well   ? Behavior During Therapy [facility] for tasks assessed/performed   ? ?  ?  ? ?  ? ? ?Past Medical History:  ?Diagnosis Date  ? Allergy   ? SEASONAL/PERENNIAL  ? Anxiety   ? Asthma   ? allergy related  ? Depression   ? GAD (generalized anxiety disorder)   ? Gastritis 2013  ? from NSAID's after foot surgery  ? Hallux rigidus   ? Hyperthyroidism   ?  hx graves disease. dr Demetrios Isaacs pcp     ? Hypothyroidism   ? (since I-131 treatment)  ? Osteoporosis 2009  ? Tendinitis of right ankle 2007  ? ?Past Surgical History:  ?Procedure Laterality Date  ? BREAST EXCISIONAL BIOPSY Right 1994  ? benign   ? BREAST SURGERY    ? fibroadenoma, right  ? hallux rigidus Right 11/06/2011  ? keller arthroplasty Left 06/29/2021  ? for hallux limitus. Dr. Milinda Pointer  ? ROTATOR CUFF REPAIR    ? right  ? TONSILLECTOMY    ? ?Patient Active Problem List  ? Diagnosis Date Noted  ? Allergic rhinitis 04/17/2021  ? Allergic rhinitis due to animal (cat) (dog) hair and dander 04/17/2021  ? Allergic rhinitis due to pollen 04/17/2021  ? Chronic allergic conjunctivitis 04/17/2021  ? Mild persistent asthma, uncomplicated 22/06/5425  ? Pure hypercholesterolemia 09/14/2019  ? OAB (overactive bladder) 09/13/2019  ? Stress due to illness of family member 07/14/2017  ? Family history of premature CAD 02/03/2013  ? Postablative hypothyroidism 02/20/2011  ? Osteoporosis 02/20/2011  ? Anxiety state, unspecified 02/20/2011  ? TIBIALIS TENDINITIS  07/14/2007  ? OTHER ENTHESOPATHY OF ANKLE AND TARSUS 07/14/2007  ? ? ?REFERRING DIAG: M20.5X2 (ICD-10-CM) - Hallux limitus of left foot Z98.890 (ICD-10-CM) - Status post left foot surgery  ? ?THERAPY DIAG:  ?Pain in left foot ? ?Other abnormalities of gait and mobility ? ?Stiffness of left foot, not elsewhere classified ? ?Muscle weakness (generalized) ? ?PERTINENT HISTORY: See above  ? ?PRECAUTIONS: none ? ?SUBJECTIVE:   Patient states she is "sore".  Had a lot going on this week.    ? ?PAIN:  ?Are you having pain? Yes: NPRS scale: 4/10 ?Pain location: left great toe ?Pain description: aching ?Aggravating factors: walking ?Relieving factors: rest ? ? ? ? ? ?OBJECTIVE:  ?  ?DIAGNOSTIC FINDINGS: na ?  ?PATIENT SURVEYS:  ?FOTO 31 ?  ?COGNITION: ?          Overall cognitive status: Within functional limits for tasks assessed               ?           ?SENSATION: ?WFL ?  ?  ?POSTURE:  ?normal ?  ?PALPATION: ?Decreased sensation and hypersensation around incision ?  ?LE ROM: ?Left great toe: ext: 10 degrees, flexion, 20 degrees, ?  ?  ?LE MMT: ?  ?  Generally 4 to 4+/5 ?  ?  ?  ?GAIT: ?Distance walked: 50 ?Assistive device utilized: None ?Level of assistance: Complete Independence ?Comments: Still using hard shoe, has not been able to fully transition to normal shoe.   ?  ?TODAY'S TREATMENT: 09/11/21 ? ?Rocker board DF/PF , INV/ EV x 20 each ?Seated toe and heel raises x 20 ?Seated great toe flexion/extension 20x; abduction/adduction 10x ?Towel scrunch 20 reps ?Ankle inv/ev x 20 with towel  ?Marble pick up x 20 marbles ?Manual ROM left great toe and all metatarsal joints along with left ankle mobility ?Cone touch x 20  in SLS (cone on table) ?1D ball toss x 20 (red plyo ball ?Step up on balance pad with right hip flexion for balance training on left fwd x 20, lateral x 20 ?Lunge to BOSU with left foot x 20 fwd and lateral ? ? ?TODAY'S TREATMENT: 09/06/21 ? ?Rocker board DF/PF , INV/ EV x 20 each ?Seated toe and heel raises x  20 ?Seated great toe flexion/extension 20x; abduction/adduction 10x ?Towel scrunch 20 reps ?Ankle inv/ev x 20 with towel  ?Marble pick up x 20 marbles ?Cone touch x 20  in SLS (cone on table) ?1D ball toss x 20 (red plyo ball ?Step up on balance pad with right hip flexion for balance training on left fwd x 20, lateral x 20 ?Lunge to BOSU with left foot x 20 fwd and lateral ?Manual ROM left great toe and all metatarsal joints along with left ankle mobility ? ?TODAY'S TREATMENT: 09/04/21 ? ?Rocker board DF/PF , INV/ EV x 20 each ?Seated toe and heel raises x 20 ?Seated  ?Seated great toe flexion/extension 20x; abduction/adduction 10x ?Towel scrunch 5x  ?Marble pick up x 20 marbles ?Cone touch x 20  in SLS (cone on table) ?1D ball toss x 20 (red plyo ball ?Step up on balance pad with right hip flexion for balance training on left fwd x 20, lateral x 20 ?Stair training ascending and descending with reciprocal gait x 1 ?Lunge to BOSU with left foot x 20 fwd and lateral ?  ?  ?  ?PATIENT EDUCATION:  ?Education details: added new exercises to HEP (see below) ?Person educated: Patient ?Education method: Explanation, Demonstration, and Verbal cues ?Education comprehension: verbalized understanding, returned demonstration, and verbal cues required ?  ?  ?HOME EXERCISE PROGRAM: ? ? Access Code: [email or web address] ?URL: [email or web address] ?Date: 08/21/2021 ?Prepared by: Ruben Im ? ?Exercises ?- Seated Toe Raise  - 1 x daily - 7 x weekly - 3 sets - 10 reps ?- Seated Heel Raise  - 1 x daily - 7 x weekly - 3 sets - 10 reps ?- Towel Scrunches  - 1 x daily - 7 x weekly - 3 sets - 10 reps ?- Ankle Inversion Eversion Towel Slide  - 1 x daily - 7 x weekly - 3 sets - 10 reps ?- Seated Marble Pick-Up with Toes  - 1 x daily - 7 x weekly - 3 sets - 10 reps ?- Seated Self Great Toe Stretch  - 1 x daily - 7 x weekly - 3 sets - 10 reps ?- Seated Ankle Alphabet  - 1 x daily - 7 x weekly - 3 sets - 10 reps ?- Seated Self Great Toe  Stretch  - 1 x daily - 7 x weekly - 1 sets - 5 reps - 20 hold ?- Big toe mobilization (Mirrored)  - 1 x daily - 7 x weekly - 1 sets - 10  reps ?- [employer]  - 1 x daily - 7 x weekly - 1 sets - 10 reps ?- Retro Step  - 1 x daily - 7 x weekly - 1 sets - 10 reps ?ASSESSMENT: ?  ?CLINICAL IMPRESSION: ?Patient did very well with dynamic SLS activities and reported no pain with this.  She was also able to tolerate adding pro stretch for gastroc soleus stretching.  She continues to report stiffness and concern with wearing any shoes other than her sneakers.   We encouraged her to begin a few minutes of wearing various shoes around her home and increasing time as tolerated.  She would benefit from continued skilled PT for ROM, proper toe off to normalize gait and ankle stability training.  ? ?OBJECTIVE IMPAIRMENTS Abnormal gait, decreased balance, difficulty walking, decreased ROM, decreased strength, increased fascial restrictions, impaired flexibility, impaired sensation, and pain.  ?  ?ACTIVITY LIMITATIONS cleaning, community activity, meal prep, laundry, yard work, and shopping.  ?  ?PERSONAL FACTORS Fitness are also affecting patient's functional outcome.  ?  ?  ?REHAB POTENTIAL: Excellent ?  ?CLINICAL DECISION MAKING: Stable/uncomplicated ?  ?EVALUATION COMPLEXITY: Low ?  ?  ?GOALS: ?Goals reviewed with patient? Yes ?  ?SHORT TERM GOALS: Target date: 09/11/2021 ?  ?Patient will be independent with initial HEP  ?Baseline: ?Goal status: MET ?  ?2.  Pain report to be no greater than 4/10  ?Baseline:  ?Goal status: MET ?  ?3.  Patient to be able to transition to normal footwear ?Baseline:  ?Goal status: MET ?  ?  ?LONG TERM GOALS: Target date: 10/09/2021 ?  ?Patient to be independent with advanced HEP  ?Baseline:  ?Goal status: INITIAL ?  ?2.  Patient to report pain no greater than 2/10  ?Baseline:  ?Goal status: INITIAL ?  ?3.  Patient to be able to resume normal footwear consistently and be able to wear low heel dress shoe.   ?Baseline:  ?Goal status: INITIAL ?  ?4.  Patient to demonstrate normal heel to toe progression on involved LE ?Baseline:  ?Goal status: INITIAL ?  ?5.  Patient to be able to walk 1 mile painfree in nor

## 2021-09-11 NOTE — Telephone Encounter (Signed)
Called [facility] back and let them know what Dr Cannon Kettle said , also faxed  to their office

## 2021-09-11 NOTE — Telephone Encounter (Signed)
Need response ASAP before patient gets there.

## 2021-09-11 NOTE — Telephone Encounter (Signed)
Does patient need to pre-medicate before any dental procedures?  She is coming into the office at 11am  ?    Please fax to : [phone]

## 2021-09-12 DIAGNOSIS — F4323 Adjustment disorder with mixed anxiety and depressed mood: Secondary | ICD-10-CM | POA: Diagnosis not present

## 2021-09-12 DIAGNOSIS — F4381 Prolonged grief disorder: Secondary | ICD-10-CM | POA: Diagnosis not present

## 2021-09-12 DIAGNOSIS — F439 Reaction to severe stress, unspecified: Secondary | ICD-10-CM | POA: Diagnosis not present

## 2021-09-13 ENCOUNTER — Ambulatory Visit: Payer: Medicare Other

## 2021-09-13 DIAGNOSIS — M79672 Pain in left foot: Secondary | ICD-10-CM

## 2021-09-13 DIAGNOSIS — R2689 Other abnormalities of gait and mobility: Secondary | ICD-10-CM

## 2021-09-13 DIAGNOSIS — M6281 Muscle weakness (generalized): Secondary | ICD-10-CM | POA: Diagnosis not present

## 2021-09-13 DIAGNOSIS — M25675 Stiffness of left foot, not elsewhere classified: Secondary | ICD-10-CM

## 2021-09-13 NOTE — Therapy (Signed)
?OUTPATIENT PHYSICAL THERAPY TREATMENT NOTE ? ? ?Patient Name: Theresa Schneider ?MRN: [id number] ?DOB:Nov 26, 1955, 66 y.o., female ?Today's Date: 09/13/2021 ? ?PCP: Rita Ohara, MD ?REFERRING PROVIDER: Rita Ohara, MD ? ?END OF SESSION:  ? PT End of Session - 09/13/21 1146   ? ? Visit Number 7   ? Date for PT Re-Evaluation 10/09/21   ? Authorization Type Medicare A and B   ? Progress Note Due on Visit 10   ? ?  ?  ? ?  ? ? ? ?Past Medical History:  ?Diagnosis Date  ? Allergy   ? SEASONAL/PERENNIAL  ? Anxiety   ? Asthma   ? allergy related  ? Depression   ? GAD (generalized anxiety disorder)   ? Gastritis 2013  ? from NSAID's after foot surgery  ? Hallux rigidus   ? Hyperthyroidism   ?  hx graves disease. dr Demetrios Isaacs pcp     ? Hypothyroidism   ? (since I-131 treatment)  ? Osteoporosis 2009  ? Tendinitis of right ankle 2007  ? ?Past Surgical History:  ?Procedure Laterality Date  ? BREAST EXCISIONAL BIOPSY Right 1994  ? benign   ? BREAST SURGERY    ? fibroadenoma, right  ? hallux rigidus Right 11/06/2011  ? keller arthroplasty Left 06/29/2021  ? for hallux limitus. Dr. Milinda Pointer  ? ROTATOR CUFF REPAIR    ? right  ? TONSILLECTOMY    ? ?Patient Active Problem List  ? Diagnosis Date Noted  ? Allergic rhinitis 04/17/2021  ? Allergic rhinitis due to animal (cat) (dog) hair and dander 04/17/2021  ? Allergic rhinitis due to pollen 04/17/2021  ? Chronic allergic conjunctivitis 04/17/2021  ? Mild persistent asthma, uncomplicated 51/06/5850  ? Pure hypercholesterolemia 09/14/2019  ? OAB (overactive bladder) 09/13/2019  ? Stress due to illness of family member 07/14/2017  ? Family history of premature CAD 02/03/2013  ? Postablative hypothyroidism 02/20/2011  ? Osteoporosis 02/20/2011  ? Anxiety state, unspecified 02/20/2011  ? TIBIALIS TENDINITIS 07/14/2007  ? OTHER ENTHESOPATHY OF ANKLE AND TARSUS 07/14/2007  ? ? ?REFERRING DIAG: M20.5X2 (ICD-10-CM) - Hallux limitus of left foot Z98.890 (ICD-10-CM) - Status post left foot surgery  ? ?THERAPY  DIAG:  ?Pain in left foot ? ?Other abnormalities of gait and mobility ? ?Stiffness of left foot, not elsewhere classified ? ?Muscle weakness (generalized) ? ?PERTINENT HISTORY: See above  ? ?PRECAUTIONS: none ? ?SUBJECTIVE:   Patient states she is still getting some soreness.  She admits she did not try the dress shoes in incremental time as suggested because her summer shoes were upstairs and she was really busy.  "I bought some low heels that I think I will try that with in the next few days".      ? ?PAIN:  ?Are you having pain? Yes: NPRS scale: 4/10 ?Pain location: left great toe ?Pain description: aching ?Aggravating factors: walking ?Relieving factors: rest ? ? ? ? ? ?OBJECTIVE:  ?  ?DIAGNOSTIC FINDINGS: na ?  ?PATIENT SURVEYS:  ?FOTO 49 ?  ?COGNITION: ?          Overall cognitive status: Within functional limits for tasks assessed               ?           ?SENSATION: ?WFL ?  ?  ?POSTURE:  ?normal ?  ?PALPATION: ?Decreased sensation and hypersensation around incision ?  ?LE ROM: ?Left great toe: ext: 10 degrees, flexion, 20 degrees, ?  ?  ?LE MMT: ?  ?  Generally 4 to 4+/5 ?  ?  ?  ?GAIT: ?Distance walked: 50 ?Assistive device utilized: None ?Level of assistance: Complete Independence ?Comments: Still using hard shoe, has not been able to fully transition to normal shoe.  ?  ? TODAY'S TREATMENT: 09/13/21 ? ?Rocker board DF/PF , INV/ EV x 20 each ?Seated toe and heel raises x 20 ?Seated great toe flexion/extension 20x; abduction/adduction 10x ?Manual ROM left great toe and all metatarsal joints along with left ankle mobility ?Cone touch x 20  in SLS (cone on table) ?1D ball toss x 20 (red plyo ball) ? ?TODAY'S TREATMENT: 09/11/21 ? ?Rocker board DF/PF , INV/ EV x 20 each ?Seated toe and heel raises x 20 ?Seated great toe flexion/extension 20x; abduction/adduction 10x ?Towel scrunch 20 reps ?Ankle inv/ev x 20 with towel  ?Marble pick up x 20 marbles ?Manual ROM left great toe and all metatarsal joints along with  left ankle mobility ?Cone touch x 20  in SLS (cone on table) ?1D ball toss x 20 (red plyo ball ?Step up on balance pad with right hip flexion for balance training on left fwd x 20, lateral x 20 ?Lunge to BOSU with left foot x 20 fwd and lateral ? ? ?TODAY'S TREATMENT: 09/06/21 ? ?Rocker board DF/PF , INV/ EV x 20 each ?Seated toe and heel raises x 20 ?Seated great toe flexion/extension 20x; abduction/adduction 10x ?Towel scrunch 20 reps ?Ankle inv/ev x 20 with towel  ?Marble pick up x 20 marbles ?Cone touch x 20  in SLS (cone on table) ?1D ball toss x 20 (red plyo ball ?Step up on balance pad with right hip flexion for balance training on left fwd x 20, lateral x 20 ?Lunge to BOSU with left foot x 20 fwd and lateral ?Manual ROM left great toe and all metatarsal joints along with left ankle mobility ? ?TODAY'S TREATMENT: 09/04/21 ? ?Rocker board DF/PF , INV/ EV x 20 each ?Seated toe and heel raises x 20 ?Seated  ?Seated great toe flexion/extension 20x; abduction/adduction 10x ?Towel scrunch 5x  ?Marble pick up x 20 marbles ?Cone touch x 20  in SLS (cone on table) ?1D ball toss x 20 (red plyo ball ?Step up on balance pad with right hip flexion for balance training on left fwd x 20, lateral x 20 ?Stair training ascending and descending with reciprocal gait x 1 ?Lunge to BOSU with left foot x 20 fwd and lateral ?  ?  ?  ?PATIENT EDUCATION:  ?Education details: added new exercises to HEP (see below) ?Person educated: Patient ?Education method: Explanation, Demonstration, and Verbal cues ?Education comprehension: verbalized understanding, returned demonstration, and verbal cues required ?  ?  ?HOME EXERCISE PROGRAM: ? ? Access Code: [email or web address] ?URL: [email or web address] ?Date: 08/21/2021 ?Prepared by: Ruben Im ? ?Exercises ?- Seated Toe Raise  - 1 x daily - 7 x weekly - 3 sets - 10 reps ?- Seated Heel Raise  - 1 x daily - 7 x weekly - 3 sets - 10 reps ?- Towel Scrunches  - 1 x daily - 7 x weekly - 3 sets -  10 reps ?- Ankle Inversion Eversion Towel Slide  - 1 x daily - 7 x weekly - 3 sets - 10 reps ?- Seated Marble Pick-Up with Toes  - 1 x daily - 7 x weekly - 3 sets - 10 reps ?- Seated Self Great Toe Stretch  - 1 x daily - 7 x weekly - 3 sets - 10 reps ?- Seated  Ankle Alphabet  - 1 x daily - 7 x weekly - 3 sets - 10 reps ?- Seated Self Great Toe Stretch  - 1 x daily - 7 x weekly - 1 sets - 5 reps - 20 hold ?- Big toe mobilization (Mirrored)  - 1 x daily - 7 x weekly - 1 sets - 10 reps ?- [employer]  - 1 x daily - 7 x weekly - 1 sets - 10 reps ?- Retro Step  - 1 x daily - 7 x weekly - 1 sets - 10 reps ?ASSESSMENT: ?  ?CLINICAL IMPRESSION: ?Patient is progressing appropriately.   She would benefit from continued skilled PT for ROM, proper toe off to normalize gait and ankle stability training.  ? ?OBJECTIVE IMPAIRMENTS Abnormal gait, decreased balance, difficulty walking, decreased ROM, decreased strength, increased fascial restrictions, impaired flexibility, impaired sensation, and pain.  ?  ?ACTIVITY LIMITATIONS cleaning, community activity, meal prep, laundry, yard work, and shopping.  ?  ?PERSONAL FACTORS Fitness are also affecting patient's functional outcome.  ?  ?  ?REHAB POTENTIAL: Excellent ?  ?CLINICAL DECISION MAKING: Stable/uncomplicated ?  ?EVALUATION COMPLEXITY: Low ?  ?  ?GOALS: ?Goals reviewed with patient? Yes ?  ?SHORT TERM GOALS: Target date: 09/11/2021 ?  ?Patient will be independent with initial HEP  ?Baseline: ?Goal status: MET ?  ?2.  Pain report to be no greater than 4/10  ?Baseline:  ?Goal status: MET ?  ?3.  Patient to be able to transition to normal footwear ?Baseline:  ?Goal status: MET ?  ?  ?LONG TERM GOALS: Target date: 10/09/2021 ?  ?Patient to be independent with advanced HEP  ?Baseline:  ?Goal status: INITIAL ?  ?2.  Patient to report pain no greater than 2/10  ?Baseline:  ?Goal status: INITIAL ?  ?3.  Patient to be able to resume normal footwear consistently and be able to wear low heel  dress shoe.  ?Baseline:  ?Goal status: INITIAL ?  ?4.  Patient to demonstrate normal heel to toe progression on involved LE ?Baseline:  ?Goal status: INITIAL ?  ?5.  Patient to be able to walk 1 mile painf

## 2021-09-18 ENCOUNTER — Ambulatory Visit: Payer: Medicare Other

## 2021-09-18 ENCOUNTER — Other Ambulatory Visit: Payer: Medicare Other

## 2021-09-18 ENCOUNTER — Ambulatory Visit
Admission: RE | Admit: 2021-09-18 | Discharge: 2021-09-18 | Disposition: A | Discharge status: Home / Self Care | Payer: Medicare Other | Source: Ambulatory Visit | Attending: Endocrinology | Admitting: Endocrinology

## 2021-09-18 DIAGNOSIS — M25675 Stiffness of left foot, not elsewhere classified: Secondary | ICD-10-CM

## 2021-09-18 DIAGNOSIS — M85852 Other specified disorders of bone density and structure, left thigh: Secondary | ICD-10-CM | POA: Diagnosis not present

## 2021-09-18 DIAGNOSIS — M6281 Muscle weakness (generalized): Secondary | ICD-10-CM | POA: Diagnosis not present

## 2021-09-18 DIAGNOSIS — Z780 Asymptomatic menopausal state: Secondary | ICD-10-CM | POA: Diagnosis not present

## 2021-09-18 DIAGNOSIS — M810 Age-related osteoporosis without current pathological fracture: Secondary | ICD-10-CM | POA: Diagnosis not present

## 2021-09-18 DIAGNOSIS — R2689 Other abnormalities of gait and mobility: Secondary | ICD-10-CM

## 2021-09-18 DIAGNOSIS — M79672 Pain in left foot: Secondary | ICD-10-CM

## 2021-09-18 DIAGNOSIS — N3941 Urge incontinence: Secondary | ICD-10-CM | POA: Diagnosis not present

## 2021-09-18 NOTE — Patient Instructions (Addendum)
?HEALTH MAINTENANCE RECOMMENDATIONS: ? ?It is recommended that you get at least 30 minutes of aerobic exercise at least 5 days/week (for weight loss, you may need as much as 60-90 minutes). This can be any activity that gets your heart rate up. This can be divided in 10-15 minute intervals if needed, but try and build up your endurance at least once a week.  Weight bearing exercise is also recommended twice weekly. ? ?Eat a healthy diet with lots of vegetables, fruits and fiber.  "Colorful" foods have a lot of vitamins (ie green vegetables, tomatoes, red peppers, etc).  Limit sweet tea, regular sodas and alcoholic beverages, all of which has a lot of calories and sugar.  Up to 1 alcoholic drink daily may be beneficial for women (unless trying to lose weight, watch sugars).  Drink a lot of water. ? ?Calcium recommendations are 1200-1500 mg daily (1500 mg for postmenopausal women or women without ovaries), and vitamin D 1000 IU daily.  This should be obtained from diet and/or supplements (vitamins), and calcium should not be taken all at once, but in divided doses. ? ?Monthly self breast exams and yearly mammograms for women over the age of 6 is recommended. ? ?Sunscreen of at least SPF 30 should be used on all sun-exposed parts of the skin when outside between the hours of 10 am and 4 pm (not just when at beach or pool, but even with exercise, golf, tennis, and yard work!)  Use a sunscreen that says "broad spectrum" so it covers both UVA and UVB rays, and make sure to reapply every 1-2 hours. ? ?Remember to change the batteries in your smoke detectors when changing your clock times in the spring and fall. Carbon monoxide detectors are recommended for your home. ? ?Use your seat belt every time you are in a car, and please drive safely and not be distracted with cell phones and texting while driving. ? ? ?Theresa Schneider , ?Thank you for taking time to come for your Medicare Wellness Visit. I appreciate your ongoing  commitment to your health goals. Please review the following plan we discussed and let me know if I can assist you in the future.  ? ?This is a list of the screening recommended for you and due dates:  ?Health Maintenance  ?Topic Date Due  ? Zoster (Shingles) Vaccine (1 of 2) Never done  ? COVID-19 Vaccine (4 - Booster for Moderna series) 03/17/2020  ? Flu Shot  12/04/2021  ? Mammogram  12/06/2021  ? Colon Cancer Screening  04/14/2022  ? Tetanus Vaccine  11/22/2023  ? Pneumonia Vaccine  Completed  ? DEXA scan (bone density measurement)  Completed  ? Hepatitis C Screening: USPSTF Recommendation to screen - Ages 41-79 yo.  Completed  ? HPV Vaccine  Aged Out  ? ?I encourage you to get yearly high dose flu shots. ?We discussed COVID bivalent booster--you declined this today.  I encourage you to get the new version in the Fall, along with your flu shot. ? ?I recommend getting the new shingles vaccine (Shingrix). Since you have Medicare, you will need to get this from the pharmacy, as it is covered by Part D. This no longer has an out of pocket cost, covered under Part D. ?This is a series of 2 injections, spaced 2 months apart.   ?This should be separated from other vaccines by at least 2 weeks. ? ?Contact [facility] if you do not hear from them by the end of the  year (you will be due for another colonoscopy in 04/2022). ? ?Use omeprazole (you can finish your prescription strength, but then use over-the-counter '20mg'$  going forward) if you know you will have reflux--late meal, unable to lay with head of the bed elevated, dietary triggers, etc.  Using it prior to dinner will help prevent reflux, rather than chasing it with antacids later. ?Pepcid can also be an alternative as an OTC medication. ? ?Please bring [place] copies of your Living Will and [place] once completed and notarized so that it can be scanned into your medical chart. ? ?

## 2021-09-18 NOTE — Therapy (Signed)
?OUTPATIENT PHYSICAL THERAPY TREATMENT NOTE ? ? ?Patient Name: Theresa Schneider ?MRN: [id number] ?DOB:[birth year], 66 y.o., female ?Today's Date: 09/18/2021 ? ?PCP: Rita Ohara, MD ?REFERRING PROVIDER: Garrel Ridgel, DPM ? ?END OF SESSION:  ? PT End of Session - 09/18/21 1019   ? ? Visit Number 8   ? Date for PT Re-Evaluation 10/09/21   ? Authorization Type Medicare A and B   ? PT Start Time 1015   ? PT Stop Time 1100   ? PT Time Calculation (min) 45 min   ? Activity Tolerance Patient tolerated treatment well   ? Behavior During Therapy [facility] for tasks assessed/performed   ? ?  ?  ? ?  ? ? ? ?Past Medical History:  ?Diagnosis Date  ? Allergy   ? SEASONAL/PERENNIAL  ? Anxiety   ? Asthma   ? allergy related  ? Depression   ? GAD (generalized anxiety disorder)   ? Gastritis 2013  ? from NSAID's after foot surgery  ? Hallux rigidus   ? Hyperthyroidism   ?  hx graves disease. dr Demetrios Isaacs pcp     ? Hypothyroidism   ? (since I-131 treatment)  ? Osteoporosis 2009  ? Tendinitis of right ankle 2007  ? ?Past Surgical History:  ?Procedure Laterality Date  ? BREAST EXCISIONAL BIOPSY Right 1994  ? benign   ? BREAST SURGERY    ? fibroadenoma, right  ? hallux rigidus Right 11/06/2011  ? keller arthroplasty Left 06/29/2021  ? for hallux limitus. Dr. Milinda Pointer  ? ROTATOR CUFF REPAIR    ? right  ? TONSILLECTOMY    ? ?Patient Active Problem List  ? Diagnosis Date Noted  ? Allergic rhinitis 04/17/2021  ? Allergic rhinitis due to animal (cat) (dog) hair and dander 04/17/2021  ? Allergic rhinitis due to pollen 04/17/2021  ? Chronic allergic conjunctivitis 04/17/2021  ? Mild persistent asthma, uncomplicated 15/17/6160  ? Pure hypercholesterolemia 09/14/2019  ? OAB (overactive bladder) 09/13/2019  ? Stress due to illness of family member 07/14/2017  ? Family history of premature CAD 02/03/2013  ? Postablative hypothyroidism 02/20/2011  ? Osteoporosis 02/20/2011  ? Anxiety state, unspecified 02/20/2011  ? TIBIALIS TENDINITIS 07/14/2007  ? OTHER  ENTHESOPATHY OF ANKLE AND TARSUS 07/14/2007  ? ? ?REFERRING DIAG: M20.5X2 (ICD-10-CM) - Hallux limitus of left foot Z98.890 (ICD-10-CM) - Status post left foot surgery  ? ?THERAPY DIAG:  ?Pain in left foot ? ?Other abnormalities of gait and mobility ? ?Stiffness of left foot, not elsewhere classified ? ?Muscle weakness (generalized) ? ?PERTINENT HISTORY: See above  ? ?PRECAUTIONS: none ? ?SUBJECTIVE:   Patient states she did try to wear some different shoes and did "ok".  She worked out in the yard for a while yesterday and did get some soreness but nothing that kept her from doing whatever she needed to do for the rest of the day.   ? ?PAIN:  ?Are you having pain? Yes: NPRS scale: 4/10 ?Pain location: left great toe ?Pain description: aching ?Aggravating factors: walking ?Relieving factors: rest ? ? ? ? ? ?OBJECTIVE:  ?  ?DIAGNOSTIC FINDINGS: na ?  ?PATIENT SURVEYS:  ?FOTO 41 ?  ?COGNITION: ?          Overall cognitive status: Within functional limits for tasks assessed               ?           ?SENSATION: ?WFL ?  ?  ?POSTURE:  ?normal ?  ?PALPATION: ?Decreased  sensation and hypersensation around incision ?  ?LE ROM: ?Left great toe: ext: 10 degrees, flexion, 20 degrees, ?  ?  ?LE MMT: ?  ?Generally 4 to 4+/5 ?  ?  ?  ?GAIT: ?Distance walked: 50 ?Assistive device utilized: None ?Level of assistance: Complete Independence ?Comments: Still using hard shoe, has not been able to fully transition to normal shoe.  ?  ? TODAY'S TREATMENT: 09/18/21 ? ?Circle Rocker board DF/PF , INV/ EV x 20 each ?Towel scrunches x 20 ?Marble pick up x 20 ?Seated toe and heel raises x 20 ?Reviewed self stretches for HEP ?Added gastroc/soleus stretches for HEP x 10 hold 10 sec ?Prostretch x 10 hold 10 sec (left) ?Rocker board x 2 min ?Step up and hold on balance pad (left) x 20 (3 sec pause) ?Manual ROM left great toe and all metatarsal joints along with left ankle mobility ?Cone touch 3 x 10  in SLS (cone on table) ?3D ball toss x 20 each  dir (red plyo ball) ? ? TODAY'S TREATMENT: 09/13/21 ? ?Rocker board DF/PF , INV/ EV x 20 each ?Seated toe and heel raises x 20 ?Seated great toe flexion/extension 20x; abduction/adduction 10x ?Manual ROM left great toe and all metatarsal joints along with left ankle mobility ?Cone touch x 20  in SLS (cone on table) ?1D ball toss x 20 (red plyo ball) ? ?TODAY'S TREATMENT: 09/11/21 ? ?Rocker board DF/PF , INV/ EV x 20 each ?Seated toe and heel raises x 20 ?Seated great toe flexion/extension 20x; abduction/adduction 10x ?Towel scrunch 20 reps ?Ankle inv/ev x 20 with towel  ?Marble pick up x 20 marbles ?Manual ROM left great toe and all metatarsal joints along with left ankle mobility ?Cone touch x 20  in SLS (cone on table) ?1D ball toss x 20 (red plyo ball ?Step up on balance pad with right hip flexion for balance training on left fwd x 20, lateral x 20 ?Lunge to BOSU with left foot x 20 fwd and lateral ? ? ?TODAY'S TREATMENT: 09/06/21 ? ?Rocker board DF/PF , INV/ EV x 20 each ?Seated toe and heel raises x 20 ?Seated great toe flexion/extension 20x; abduction/adduction 10x ?Towel scrunch 20 reps ?Ankle inv/ev x 20 with towel  ?Marble pick up x 20 marbles ?Cone touch x 20  in SLS (cone on table) ?1D ball toss x 20 (red plyo ball ?Step up on balance pad with right hip flexion for balance training on left fwd x 20, lateral x 20 ?Lunge to BOSU with left foot x 20 fwd and lateral ?Manual ROM left great toe and all metatarsal joints along with left ankle mobility ? ?TODAY'S TREATMENT: 09/04/21 ? ?Rocker board DF/PF , INV/ EV x 20 each ?Seated toe and heel raises x 20 ?Seated  ?Seated great toe flexion/extension 20x; abduction/adduction 10x ?Towel scrunch 5x  ?Marble pick up x 20 marbles ?Cone touch x 20  in SLS (cone on table) ?1D ball toss x 20 (red plyo ball ?Step up on balance pad with right hip flexion for balance training on left fwd x 20, lateral x 20 ?Stair training ascending and descending with reciprocal gait x 1 ?Lunge  to BOSU with left foot x 20 fwd and lateral ?  ?  ?  ?PATIENT EDUCATION:  ?Education details: added new exercises to HEP (see below) ?Person educated: Patient ?Education method: Explanation, Demonstration, and Verbal cues ?Education comprehension: verbalized understanding, returned demonstration, and verbal cues required ?  ?  ?HOME EXERCISE PROGRAM: ? ? Access   Code: [email or web address] ?URL: [email or web address] ?Date: 08/21/2021 ?Prepared by: Ruben Im ? ?Exercises ?- Seated Toe Raise  - 1 x daily - 7 x weekly - 3 sets - 10 reps ?- Seated Heel Raise  - 1 x daily - 7 x weekly - 3 sets - 10 reps ?- Towel Scrunches  - 1 x daily - 7 x weekly - 3 sets - 10 reps ?- Ankle Inversion Eversion Towel Slide  - 1 x daily - 7 x weekly - 3 sets - 10 reps ?- Seated Marble Pick-Up with Toes  - 1 x daily - 7 x weekly - 3 sets - 10 reps ?- Seated Self Great Toe Stretch  - 1 x daily - 7 x weekly - 3 sets - 10 reps ?- Seated Ankle Alphabet  - 1 x daily - 7 x weekly - 3 sets - 10 reps ?- Seated Self Great Toe Stretch  - 1 x daily - 7 x weekly - 1 sets - 5 reps - 20 hold ?- Big toe mobilization (Mirrored)  - 1 x daily - 7 x weekly - 1 sets - 10 reps ?- [place] Pew  - 1 x daily - 7 x weekly - 1 sets - 10 reps ?- Retro Step  - 1 x daily - 7 x weekly - 1 sets - 10 reps ?ASSESSMENT: ?  ?CLINICAL IMPRESSION: ?Patient is progressing appropriately.   She would benefit from continued skilled PT for ROM, proper toe off to normalize gait and ankle stability training.  ? ?OBJECTIVE IMPAIRMENTS Abnormal gait, decreased balance, difficulty walking, decreased ROM, decreased strength, increased fascial restrictions, impaired flexibility, impaired sensation, and pain.  ?  ?ACTIVITY LIMITATIONS cleaning, community activity, meal prep, laundry, yard work, and shopping.  ?  ?PERSONAL FACTORS Fitness are also affecting patient's functional outcome.  ?  ?  ?REHAB POTENTIAL: Excellent ?  ?CLINICAL DECISION MAKING: Stable/uncomplicated ?  ?EVALUATION  COMPLEXITY: Low ?  ?  ?GOALS: ?Goals reviewed with patient? Yes ?  ?SHORT TERM GOALS: Target date: 09/11/2021 ?  ?Patient will be independent with initial HEP  ?Baseline: ?Goal status: MET ?  ?2.  Pain report to be

## 2021-09-18 NOTE — Progress Notes (Signed)
Chief Complaint  Patient presents with   Medicare Wellness    Fasting AWV with pap. Has not gotten Shingrix, flu or covid booster. Does not want any boosters. No concerns.    Theresa Schneider is a 66 y.o. female who presents for Medicare Wellness visit, and follow-up on chronic medical conditions.    Generalized anxiety:  We last discussed this in March, after she had foot surgery, and so wasn't caring for her elderly parents.  Her two sisters were helping out.  We had started Buspar, but she experienced some dizziness (unclear if from buspar vs from the pain meds), stopped the medication.  We had said that since her anxiety was significantly improved, and she was set up to start counseling, she could hold off on the Buspar, and re-try it if anxiety worsens (especially if there was a shift back to her, rather than shared burden, regarding caring for her parents). Today she reports that 1 sister helps with driving only. She found someone last week to help in the home soon.  Other sister (with bipolar) is still supposed to be helping.  Patient has been going once a week or less. She is getting counseling weekly.  Anxiety has increased (lots of phone calls regarding OT/PT, etc for her mom).  She feels like she is managing her anxiety well.  Hasn't felt the need to re-try buspar. Sleeping well most of the time, no longer needing melatonin nightly.   Cystocele and OAB:  She is getting PTNS for OAB monthly (went yesterday), which helps.  She had noticed some persistent urgency, some leakage, but better if voiding frequently.  (Previously didn't tolerate Myrbetriq--helped her bladder, but increased anxiety, faster pulse. She got HA's from ditropan and Vesicare as well, as well as dry eyes.) +Stress incontinence with sneezing/laughing, not every time, if bladder is full. She stopped the premarin cream for a while (related to being busy with surgery, PT), restarted it 2 weeks ago, using it 1-2x/week.  GERD:  This is overall improved.  She continues to eat earlier since retiring.  Head of bed is elevated at night. She used to use omeprazole only when laying flat (traveling, babysitting and sleeping in other beds).  Currently she mainly uses Tums prn.  Denies dysphagia.   She has h/o vitamin D deficiency, monitored by Dr. Dwyane Dee. She is currently taking 5000 IU of D3 several times/week and Ca+D once daily. Last level was 60.35 in 12/2020. She is currently taking 1000 IU daily.  Osteoporosis--previously treated with Actonel x 2 years in the distant past, also took Prolia (twice, changed due to cost). She got yearly Reclast infusions 10/2013-11/2018 through Dr. Dwyane Dee.  She took 1 year off, and had another dose last year. Reportedly T was -3.5 prior to Reclast, in 09/2013.  She had recent DEXA (per Dr. Dwyane Dee), showing T-2.5 at spine.  Prior DEXA was 01/2019. T-2.3 spine, -1.8 L fem neck. She has not yet heard the results from Dr. Dwyane Dee. She is compliant with her Calcium and D (see above). No weight-bearing exercise since November.  H/o Grave's disease, s/p RAI treatment, with resultant hypothyroidism. Under the care of Dr. Dwyane Dee. She denies thyroid symptoms. (Dose was lowered in error for a bit, but back on 162mg). She has a little less energy compared to prior to her surgery. She also decreased her coffee intake (only in the morning now). She feels unrefreshed in the mornings, and is tired during the day.  She does get up at night  related to when her husband gets up, plus up 1-2x/night to void.  She denies snoring. Lab Results  Component Value Date   TSH 4.12 04/09/2021   Allergies:  She has been off immunotherapy since last summer, under the care of Dr. Donneta Romberg.  Denies worsening of allergies. She is on Flonase and montelukast. She uses zyrtec occasionally (none in the last week). She has some mild chronic congestion.  She denies sinus headaches.   Immunization History  Administered Date(s) Administered   DTaP  10/05/2006   Influenza, High Dose Seasonal PF 02/19/2017, 02/08/2019   Influenza,inj,Quad PF,6+ Mos 03/10/2018   Influenza-Unspecified 02/19/2017   Moderna Sars-Covid-2 Vaccination 05/18/2019, 06/21/2019, 01/21/2020   PNEUMOCOCCAL CONJUGATE-20 09/13/2020   Pneumococcal Polysaccharide-23 09/13/2019   Tdap 11/21/2013   DId not get flu shot this year Last Pap smear: 07/2016, normal with no high risk HPV Last mammogram: 12/2020 Last colonoscopy: 04/2012 Dr. Bronson Ing, repeat 10 years Last DEXA:  09/2021 T-2.5 at spine Dentist: twice yearly Ophtho: yearly, wears glasses. Exercise:  getting PT for her foot.  Walking some (10 mins at a time). No weight-bearing exercise.  Lipid screen:   Lab Results  Component Value Date   CHOL 243 (H) 09/13/2020   HDL 76 09/13/2020   LDLCALC 153 (H) 09/13/2020   TRIG 85 09/13/2020   CHOLHDL 3.2 09/13/2020   Diet has changed since her husband got a feeding tube, not cooking much.  She doesn't eat red meat often.  Eggs about 5/week on average. +cheese (less than in the past). Lowfat yogurt; sometimes regular milk, some oatmilk and almondmilk. +mayo on tuna.  Patient Care Team: Rita Ohara, MD as PCP - General (Family Medicine) Elayne Snare, MD as Attending Physician (Endocrinology) Mosetta Anis, MD (Allergy)  Podiatrist: Dr. Milinda Pointer Ophtho: Dr. Gershon Crane Urologist: Dr. Lovena Neighbours Dentist: Dr. Loma Newton Derm: Dr. Jarome Matin Ortho: Dr. Noemi Chapel (not seen in a while) GI: Dr. Olevia Perches (retired). Holistic med: Dr. Bettey Costa (in [place])--not in 2 years Counselor:  Romie Minus  Depression Screening: [place] Office Visit from 09/19/2021 in [place]  PHQ-2 Total Score 0       Falls screen:     09/19/2021    8:47 AM 09/13/2020    9:32 AM 09/13/2019    9:55 AM  Fall Risk   Falls in the past year? 0 0 0  Number falls in past yr: 0 0 0  Injury with Fall? 0 0 0  Risk for fall due to : No Fall Risks No Fall Risks   Follow up Falls  evaluation completed Falls evaluation completed      Functional Status Survey: Is the patient deaf or have difficulty hearing?: No Does the patient have difficulty seeing, even when wearing glasses/contacts?: No Does the patient have difficulty concentrating, remembering, or making decisions?: No Does the patient have difficulty walking or climbing stairs?: No Does the patient have difficulty dressing or bathing?: No Does the patient have difficulty doing errands alone such as visiting a doctor's office or shopping?: No  Mini-Cog Scoring: 5   End of Life Discussion:  Patient does not have a living will and medical power of attorney. Given paperwork last year, still has at home and plans to do.   PMH, PSH, SH and FH were reviewed and updated.  Outpatient Encounter Medications as of 09/19/2021  Medication Sig Note   azelastine (ASTELIN) 0.1 % nasal spray 1-2 puff in each nostril 09/19/2021: In the am, if needed   Calcium-Magnesium-Vitamin D (CALCIUM 500  PO) Take 1 capsule by mouth 2 (two) times daily. 09/13/2020: Taking once daily   Cetirizine HCl (ZYRTEC PO) Take by mouth. 09/19/2021: Uses prn, none in a week.  Helps when she takes it   cholecalciferol (VITAMIN D) 1000 units tablet Take 1,000 Units by mouth daily.    fluticasone (FLONASE) 50 MCG/ACT nasal spray Place 1 spray into both nostrils daily. 07/23/2021: nightly   levothyroxine (SYNTHROID) 100 MCG tablet 1 tablet every morning except half tablet on Sundays    Magnesium 500 MG CAPS Take 1 capsule by mouth daily.    MELATONIN PO Take 1 capsule by mouth daily. 06/11/2021: 1.'5mg'$    montelukast (SINGULAIR) 10 MG tablet Take 10 mg by mouth daily. 09/13/2019: Doesn't always take every day   NON FORMULARY Take 4-6 drops by mouth 2 (two) times daily. 10/12/2020: CBD OIL    Omega-3 Fatty Acids (FISH OIL) 1000 MG CAPS Take 1 capsule by mouth daily.    PREMARIN vaginal cream  09/19/2021: Using 1-2x/week (recently restarted after a break)   TURMERIC  PO Take 1 capsule by mouth daily.    vitamin C (ASCORBIC ACID) 500 MG tablet Take 500 mg by mouth daily.    zinc gluconate 50 MG tablet Take 50 mg by mouth daily. 07/23/2021: Every 2-3 days   zoledronic acid (RECLAST) 5 MG/100ML SOLN injection Inject 100 mLs (5 mg total) into the vein once.    ALPRAZolam (XANAX) 0.25 MG tablet Take 1-2 tablets (0.25-0.5 mg total) by mouth 3 (three) times daily as needed for anxiety. (Patient not taking: Reported on 06/11/2021) 06/11/2021: As needed   busPIRone (BUSPAR) 5 MG tablet Start at 1 tablet twice daily.  May increase to three times daily vs 1.5 tablets twice daily (Patient not taking: Reported on 07/23/2021)    calcium carbonate (TUMS - DOSED IN MG ELEMENTAL CALCIUM) 500 MG chewable tablet Chew 2 tablets by mouth daily. (Patient not taking: Reported on 09/19/2021) 09/19/2021: Not using currently regularly, just prn heartburn   escitalopram (LEXAPRO) 10 MG tablet Take 1/2 tablet by mouth every morning.  Increase to full tablet after a week if/when tolerated (Patient not taking: Reported on 06/11/2021) 06/11/2021: Stopped because she felt "like a zombie"   omeprazole (PRILOSEC) 40 MG capsule TAKE ONE CAPSULE BY MOUTH DAILY BEFORE DINNER (Patient not taking: Reported on 06/11/2021) 09/19/2021: She has a few left, hasn't been using   ondansetron (ZOFRAN) 4 MG tablet Take 1 tablet (4 mg total) by mouth every 8 (eight) hours as needed. (Patient not taking: Reported on 06/11/2021) 06/11/2021: For post op   PROAIR HFA 108 (90 Base) MCG/ACT inhaler  (Patient not taking: Reported on 06/11/2021) 09/13/2020: Uses prn, usually just when ill   No facility-administered encounter medications on file as of 09/19/2021.   Allergies  Allergen Reactions   Codeine Phosphate Nausea And Vomiting   Latex Itching    senitivity    ROS:  The patient denies anorexia, fever, significant weight changes, vision changes, decreased hearing, ear pain, sore throat, breast concerns, chest pain, palpitations,  dizziness, syncope, dyspnea on exertion, cough, swelling, nausea, vomiting, diarrhea, constipation, abdominal pain, melena, hematochezia, hematuria, dysuria, vaginal bleeding, discharge, odor or itch, genital lesions, numbness, tingling, weakness, tremor, suspicious skin lesions, depression, abnormal bleeding/bruising, or enlarged lymph nodes.  +anxiety and intermittent trouble sleeping, improved. Fatigue/unrefreshed sleep per HPI. No snoring (per husband). Frequent HA's--some from stress, grinds her teeth.  Denies sinus headaches. (HA's often bitemporal) Vaginal dryness. No hot flashes or night sweats. Some residual  L foot pain, s/p surgery (in PT). Chronic congestion/allergies, overall controlled. Heartburn infrequently, r/b Tums prn. Denies dysphagia Leakage of urine with sneeze/laughing. Some urinary urgency.  Up 1-2x/night to void.   PHYSICAL EXAM:  BP 120/70   Pulse 68   Ht '5\' 2"'$  (1.575 m)   Wt 131 lb (59.4 kg)   LMP 01/04/2005   BMI 23.96 kg/m   Wt Readings from Last 3 Encounters:  09/19/21 131 lb (59.4 kg)  07/23/21 133 lb (60.3 kg)  06/11/21 128 lb (58.1 kg)    General Appearance:    Alert, cooperative, talkative female in no distress, appears stated age  Head:    Normocephalic, without obvious abnormality, atraumatic  Eyes:    PERRL, conjunctiva/corneas clear, EOM's intact, fundi benign  Ears:    Normal TM's and external ear canals  Nose:   Mild-mod edema of nasal mucosa, no erythema or mucus, no sinus tenderness.  Throat:   normal mucosa, no lesions  Neck:   Supple, no lymphadenopathy;  thyroid:  no enlargement/ tenderness/nodules; no carotid bruit or JVD  Back:    Spine nontender, no curvature, ROM normal, no CVA tenderness  Lungs:     Clear to auscultation bilaterally without wheezes, rales or ronchi; respirations unlabored  Chest Wall:    No tenderness or deformity.    Heart:    Regular rate and rhythm, S1 and S2 normal, no murmur, rub or gallop  Breast Exam:    No nipple discharge or inversion.  No skin dimpling breast masses or tenderness. No axillary lymphadenopathy  Abdomen:     Soft, non-tender, nondistended, normoactive bowel sounds,    no masses, no hepatosplenomegaly  Genitalia:    Normal external genitalia without lesions, atrophic changes noted. Small cystocele (not to level of introitus). BUS and vagina normal; no cervical lesions or cervical motion tenderness. No abnormal vaginal discharge.  Uterus and adnexa not enlarged, nontender, no masses.  Pap performed  Rectal:    Normal tone, no masses or tenderness; no stool in vault for heme testing.  Extremities:   No clubbing, cyanosis or edema  Pulses:   2+ and symmetric all extremities  Skin:   Skin color, texture, turgor normal, no rashes.  Lymph nodes:   Cervical, supraclavicular, axillary and inguinal nodes normal  Neurologic:   Normal strength, sensation and gait; reflexes 2+ and symmetric throughout                              Psych:   Normal mood, affect, hygiene and grooming.   ASSESSMENT/PLAN:  Medicare annual wellness visit, subsequent  Generalized anxiety disorder - improved; getting counseling, not using meds.  Consider re-trying Buspar if worsening anxiety in future. Cont counseling  Other osteoporosis, unspecified pathological fracture presence - to f/u with endo re: recent DEXA; showing osteoporosis  OAB (overactive bladder) - doing well with PTNS  Hypercholesterolemia - borderline LDL with good HDL in past. Due for recheck - Plan: Lipid panel  Perennial allergic rhinitis - under care of allergist, no longer on immunotherapy.  Doing well on current regimen  Postablative hypothyroidism - managed by endo, euthryoid clinically  Gastroesophageal reflux disease without esophagitis - reviewed proper diet/behavioral recs. PPI prn prior to triggering/late meals  Mild persistent asthma, uncomplicated - stable; under care of allergist  Encounter for gynecological examination  without abnormal finding - Plan: Cytology - PAP([place])   Discussed monthly self breast exams and yearly mammograms; at  least 30 minutes of aerobic activity at least 5 days/week, weight-bearing exercise at least 2x/week; proper sunscreen use reviewed; healthy diet, including goals of calcium and vitamin D intake and alcohol recommendations (less than or equal to 1 drink/day) reviewed; regular seatbelt use; changing batteries in smoke detectors, carbon monoxide detectors.  Immunization recommendations discussed--yearly flu shots recommended.  Shingrix recommended, to get from pharmacy. Risks/SE reviewed. Bivalent COVID booster recommended, declined. Colonoscopy recommendations reviewed, UTD, due 04/2022.   MOST form reviewed/signed, full code, full care Reminded to complete Living Will and [place], encouraged to get [place] copies when completed and notarized  F/u 1 year, sooner prn   Medicare Attestation I have personally reviewed: The patient's medical and social history Their use of alcohol, tobacco or illicit drugs Their current medications and supplements The patient's functional ability including ADLs,fall risks, home safety risks, cognitive, and hearing and visual impairment Diet and physical activities Evidence for depression or mood disorders  The patient's weight, height, BMI have been recorded in the chart.  I have made referrals, counseling, and provided education to the patient based on review of the above and I have provided the patient with a written personalized care plan for preventive services.

## 2021-09-19 ENCOUNTER — Encounter: Payer: Self-pay | Admitting: Family Medicine

## 2021-09-19 ENCOUNTER — Other Ambulatory Visit (HOSPITAL_COMMUNITY)
Admission: RE | Admit: 2021-09-19 | Discharge: 2021-09-19 | Disposition: A | Discharge status: Home / Self Care | Payer: Medicare Other | Source: Ambulatory Visit | Attending: Family Medicine | Admitting: Family Medicine

## 2021-09-19 ENCOUNTER — Ambulatory Visit (INDEPENDENT_AMBULATORY_CARE_PROVIDER_SITE_OTHER): Payer: Medicare Other | Admitting: Family Medicine

## 2021-09-19 VITALS — BP 120/70 | HR 68 | Ht 62.0 in | Wt 131.0 lb

## 2021-09-19 DIAGNOSIS — F4323 Adjustment disorder with mixed anxiety and depressed mood: Secondary | ICD-10-CM | POA: Diagnosis not present

## 2021-09-19 DIAGNOSIS — K219 Gastro-esophageal reflux disease without esophagitis: Secondary | ICD-10-CM | POA: Diagnosis not present

## 2021-09-19 DIAGNOSIS — E890 Postprocedural hypothyroidism: Secondary | ICD-10-CM

## 2021-09-19 DIAGNOSIS — Z0000 Encounter for general adult medical examination without abnormal findings: Secondary | ICD-10-CM

## 2021-09-19 DIAGNOSIS — F439 Reaction to severe stress, unspecified: Secondary | ICD-10-CM | POA: Diagnosis not present

## 2021-09-19 DIAGNOSIS — N3281 Overactive bladder: Secondary | ICD-10-CM

## 2021-09-19 DIAGNOSIS — J3089 Other allergic rhinitis: Secondary | ICD-10-CM | POA: Diagnosis not present

## 2021-09-19 DIAGNOSIS — Z01419 Encounter for gynecological examination (general) (routine) without abnormal findings: Secondary | ICD-10-CM | POA: Insufficient documentation

## 2021-09-19 DIAGNOSIS — M818 Other osteoporosis without current pathological fracture: Secondary | ICD-10-CM

## 2021-09-19 DIAGNOSIS — Z1151 Encounter for screening for human papillomavirus (HPV): Secondary | ICD-10-CM | POA: Diagnosis not present

## 2021-09-19 DIAGNOSIS — J4530 Mild persistent asthma, uncomplicated: Secondary | ICD-10-CM

## 2021-09-19 DIAGNOSIS — F4381 Prolonged grief disorder: Secondary | ICD-10-CM | POA: Diagnosis not present

## 2021-09-19 DIAGNOSIS — E7800 Pure hypercholesterolemia, unspecified: Secondary | ICD-10-CM

## 2021-09-19 DIAGNOSIS — F411 Generalized anxiety disorder: Secondary | ICD-10-CM

## 2021-09-20 ENCOUNTER — Ambulatory Visit: Payer: Medicare Other

## 2021-09-20 DIAGNOSIS — M6281 Muscle weakness (generalized): Secondary | ICD-10-CM | POA: Diagnosis not present

## 2021-09-20 DIAGNOSIS — M25675 Stiffness of left foot, not elsewhere classified: Secondary | ICD-10-CM

## 2021-09-20 DIAGNOSIS — M79672 Pain in left foot: Secondary | ICD-10-CM

## 2021-09-20 DIAGNOSIS — R2689 Other abnormalities of gait and mobility: Secondary | ICD-10-CM | POA: Diagnosis not present

## 2021-09-20 LAB — LIPID PANEL
Chol/HDL Ratio: 3.1 ratio (ref 0.0–4.4)
Cholesterol, Total: 234 mg/dL — ABNORMAL HIGH (ref 100–199)
HDL: 76 mg/dL (ref >39)
LDL Chol Calc (NIH): 144 mg/dL — ABNORMAL HIGH (ref 0–99)
Triglycerides: 80 mg/dL (ref 0–149)
VLDL Cholesterol Cal: 14 mg/dL (ref 5–40)

## 2021-09-20 NOTE — Therapy (Addendum)
OUTPATIENT PHYSICAL THERAPY TREATMENT NOTE   Patient Name: Theresa Schneider MRN: [id number] DOB:[birth year], 66 y.o., female Today's Date: 09/20/2021  PCP: Rita Ohara, MD REFERRING PROVIDER: Rita Ohara, MD  END OF SESSION:   PT End of Session - 09/20/21 0837     Visit Number 9    Date for PT Re-Evaluation 10/09/21    Authorization Type Medicare A and B    Progress Note Due on Visit 10    PT Start Time [phone]    PT Stop Time 0935    PT Time Calculation (min) 57 min    Activity Tolerance Patient tolerated treatment well    Behavior During Therapy [facility] for tasks assessed/performed              Past Medical History:  Diagnosis Date   Allergy    SEASONAL/PERENNIAL   Anxiety    Asthma    allergy related   Depression    GAD (generalized anxiety disorder)    Gastritis 2013   from NSAID's after foot surgery   Hallux rigidus    Hyperthyroidism     hx graves disease. dr Demetrios Isaacs pcp      Hypothyroidism    (since I-131 treatment)   Osteoporosis 2009   Tendinitis of right ankle 2007   Past Surgical History:  Procedure Laterality Date   BREAST EXCISIONAL BIOPSY Right 1994   benign    BREAST SURGERY     fibroadenoma, right   hallux rigidus Right 11/06/2011   keller arthroplasty Left 06/29/2021   for hallux limitus. Dr. Milinda Pointer   ROTATOR CUFF REPAIR     right   TONSILLECTOMY     Patient Active Problem List   Diagnosis Date Noted   Allergic rhinitis 04/17/2021   Allergic rhinitis due to animal (cat) (dog) hair and dander 04/17/2021   Allergic rhinitis due to pollen 04/17/2021   Chronic allergic conjunctivitis 04/17/2021   Mild persistent asthma, uncomplicated 03/54/6568   Pure hypercholesterolemia 09/14/2019   OAB (overactive bladder) 09/13/2019   Stress due to illness of family member 07/14/2017   Family history of premature CAD 02/03/2013   Postablative hypothyroidism 02/20/2011   Osteoporosis 02/20/2011   Anxiety state, unspecified 02/20/2011   TIBIALIS TENDINITIS  07/14/2007   OTHER ENTHESOPATHY OF ANKLE AND TARSUS 07/14/2007    REFERRING DIAG: M20.5X2 (ICD-10-CM) - Hallux limitus of left foot Z98.890 (ICD-10-CM) - Status post left foot surgery   THERAPY DIAG:  Pain in left foot  Other abnormalities of gait and mobility  Stiffness of left foot, not elsewhere classified  Muscle weakness (generalized)  PERTINENT HISTORY: See above   PRECAUTIONS: none  SUBJECTIVE:   Patient states "its just sore today".  She wore a couple of different shoes and went barefoot as well since last visit with just a little soreness.   Overall, doing good and feels she is ready for DC today.    PAIN:  Are you having pain? Yes: NPRS scale: 2/10 Pain location: left great toe Pain description: aching Aggravating factors: walking Relieving factors: rest      OBJECTIVE:    DIAGNOSTIC FINDINGS: na   PATIENT SURVEYS:  FOTO 46  (84 on DC 09-20-20)   COGNITION:           Overall cognitive status: Within functional limits for tasks assessed                          SENSATION: WFL     POSTURE:  normal   PALPATION: Decreased sensation and hypersensation around incision   LE ROM: Left great toe: ext: 15 degrees, flexion, 24 degrees,     LE MMT:   Generally 4+/5       GAIT: Distance walked: 50 Assistive device utilized: None Level of assistance: Complete Independence Comments: Normal heel to toe progression with normal shoes    TODAY'S TREATMENT: 09/20/21  Circle Rocker board DF/PF , INV/ EV x 20 each Towel scrunches x 20 Marble pick up x 20 Seated toe and heel raises x 20 Reviewed self stretches for HEP Added gastroc/soleus stretches for HEP x 10 hold 10 sec Prostretch x 10 hold 10 sec (left) Rocker board x 2 min Step up and hold on balance pad (left) x 20 (3 sec pause) Manual ROM left great toe and all metatarsal joints along with left ankle mobility Cone touch 3 x 10  in SLS (cone on table) 3D ball toss x 20 each dir (red plyo  ball) Reviewed HEP and DC plan  TODAY'S TREATMENT: 09/18/21  Circle Rocker board DF/PF , INV/ EV x 20 each Towel scrunches x 20 Marble pick up x 20 Seated toe and heel raises x 20 Reviewed self stretches for HEP Added gastroc/soleus stretches for HEP x 10 hold 10 sec Prostretch x 10 hold 10 sec (left) Rocker board x 2 min Step up and hold on balance pad (left) x 20 (3 sec pause) Manual ROM left great toe and all metatarsal joints along with left ankle mobility Cone touch 3 x 10  in SLS (cone on table) 3D ball toss x 20 each dir (red plyo ball)   TODAY'S TREATMENT: 09/13/21  Rocker board DF/PF , INV/ EV x 20 each Seated toe and heel raises x 20 Seated great toe flexion/extension 20x; abduction/adduction 10x Manual ROM left great toe and all metatarsal joints along with left ankle mobility Cone touch x 20  in SLS (cone on table) 1D ball toss x 20 (red plyo ball)      PATIENT EDUCATION:  Education details: reviewed HEP, progressing shoe wearing and DC plan Person educated: Patient Education method: Explanation, Demonstration, and Verbal cues Education comprehension: verbalized understanding, returned demonstration, and verbal cues required     HOME EXERCISE PROGRAM:   Access Code: [email or web address] URL: [email or web address] Date: 08/21/2021 Prepared by: Ruben Im  Exercises - Seated Toe Raise  - 1 x daily - 7 x weekly - 3 sets - 10 reps - Seated Heel Raise  - 1 x daily - 7 x weekly - 3 sets - 10 reps - Towel Scrunches  - 1 x daily - 7 x weekly - 3 sets - 10 reps - Ankle Inversion Eversion Towel Slide  - 1 x daily - 7 x weekly - 3 sets - 10 reps - Seated Marble Pick-Up with Toes  - 1 x daily - 7 x weekly - 3 sets - 10 reps - Seated Self Great Toe Stretch  - 1 x daily - 7 x weekly - 3 sets - 10 reps - Seated Ankle Alphabet  - 1 x daily - 7 x weekly - 3 sets - 10 reps - Seated Self Great Toe Stretch  - 1 x daily - 7 x weekly - 1 sets - 5 reps - 20 hold - Big toe  mobilization (Mirrored)  - 1 x daily - 7 x weekly - 1 sets - 10 reps - [place] Pew  - 1 x daily - 7 x weekly - 1 sets -  10 reps - Retro Step  - 1 x daily - 7 x weekly - 1 sets - 10 reps ASSESSMENT:   CLINICAL IMPRESSION: Patient has made excellent progress. She is beginning to wear different shoes and is able to walk barefoot in the house.  She is compliant and well motivated.  She has met all goals and should continue to do well.  We will DC at this time.    OBJECTIVE IMPAIRMENTS Abnormal gait, decreased balance, difficulty walking, decreased ROM, decreased strength, increased fascial restrictions, impaired flexibility, impaired sensation, and pain.    ACTIVITY LIMITATIONS cleaning, community activity, meal prep, laundry, yard work, and shopping.    PERSONAL FACTORS Fitness are also affecting patient's functional outcome.      REHAB POTENTIAL: Excellent   CLINICAL DECISION MAKING: Stable/uncomplicated   EVALUATION COMPLEXITY: Low     GOALS: Goals reviewed with patient? Yes   SHORT TERM GOALS: Target date: 09/11/2021   Patient will be independent with initial HEP  Baseline: Goal status: MET   2.  Pain report to be no greater than 4/10  Baseline:  Goal status: MET   3.  Patient to be able to transition to normal footwear Baseline:  Goal status: MET     LONG TERM GOALS: Target date: 10/09/2021   Patient to be independent with advanced HEP  Baseline:  Goal status: MET   2.  Patient to report pain no greater than 2/10  Baseline:  Goal status: MET   3.  Patient to be able to resume normal footwear consistently and be able to wear low heel dress shoe.  Baseline:  Goal status: MET   4.  Patient to demonstrate normal heel to toe progression on involved LE Baseline:  Goal status: MET   5.  Patient to be able to walk 1 mile painfree in normal shoes Baseline:  Goal status: PARTIALLY MET         PLAN: PT FREQUENCY: 1-2x/week   PT DURATION: 8 weeks   PLANNED  INTERVENTIONS: Therapeutic exercises, Therapeutic activity, Neuromuscular re-education, Balance training, Gait training, Patient/Family education, Joint mobilization, Stair training, Aquatic Therapy, Dry Needling, Electrical stimulation, Cryotherapy, Moist heat, scar mobilization, Splintting, Taping, Ionotophoresis 4mg /ml Dexamethasone, and Manual therapy   PLAN FOR NEXT SESSION:  We will DC at this time.     PHYSICAL THERAPY DISCHARGE SUMMARY  Visits from Start of Care: 9  Current functional level related to goals / functional outcomes: See above   Remaining deficits: See above   Education / Equipment: See above   Patient agrees to discharge. Patient goals were met. Patient is being discharged due to meeting the stated rehab goals.   Anderson Malta B. Franklin [place], PT 12/25/21 5:26 PM   Phone: [phone] Fax: [phone]

## 2021-09-21 LAB — CYTOLOGY - PAP
Comment: NEGATIVE
Diagnosis: NEGATIVE
High risk HPV: NEGATIVE

## 2021-10-08 ENCOUNTER — Other Ambulatory Visit (INDEPENDENT_AMBULATORY_CARE_PROVIDER_SITE_OTHER): Payer: Medicare Other

## 2021-10-08 DIAGNOSIS — M810 Age-related osteoporosis without current pathological fracture: Secondary | ICD-10-CM | POA: Diagnosis not present

## 2021-10-08 DIAGNOSIS — E890 Postprocedural hypothyroidism: Secondary | ICD-10-CM | POA: Diagnosis not present

## 2021-10-08 LAB — TSH: TSH: 5.49 u[IU]/mL (ref 0.35–5.50)

## 2021-10-08 LAB — BASIC METABOLIC PANEL
BUN: 9 mg/dL (ref 6–23)
CO2: 26 mEq/L (ref 19–32)
Calcium: 9.2 mg/dL (ref 8.4–10.5)
Chloride: 102 mEq/L (ref 96–112)
Creatinine, Ser: 0.69 mg/dL (ref 0.40–1.20)
GFR: 89.47 mL/min (ref >60.00)
Glucose, Bld: 75 mg/dL (ref 70–99)
Potassium: 3.9 mEq/L (ref 3.5–5.1)
Sodium: 135 mEq/L (ref 135–145)

## 2021-10-08 LAB — VITAMIN D 25 HYDROXY (VIT D DEFICIENCY, FRACTURES): VITD: 38.84 ng/mL (ref 30.00–100.00)

## 2021-10-08 LAB — T4, FREE: Free T4: 1 ng/dL (ref 0.60–1.60)

## 2021-10-09 ENCOUNTER — Ambulatory Visit: Payer: Medicare Other

## 2021-10-10 DIAGNOSIS — F4381 Prolonged grief disorder: Secondary | ICD-10-CM | POA: Diagnosis not present

## 2021-10-10 DIAGNOSIS — F4323 Adjustment disorder with mixed anxiety and depressed mood: Secondary | ICD-10-CM | POA: Diagnosis not present

## 2021-10-10 DIAGNOSIS — F439 Reaction to severe stress, unspecified: Secondary | ICD-10-CM | POA: Diagnosis not present

## 2021-10-10 NOTE — Progress Notes (Signed)
Patient ID: Theresa Schneider, female   DOB: 28-Dec-1955, 66 y.o.   MRN: [id number]    Reason for Appointment: Follow-up of thyroid and of osteoporosis   History of Present Illness:   Osteoporosis history: She has previously been evaluated and treated by her gynecologist since about 2004. She was given a trial of Actonel about 10 years ago. She  had difficulty tolerating this because of abdominal discomfort and not clear if she took this regularly, probably not over 2 years  She was subsequently given Prolia starting in about 2012 and she got only 3 injections. The injection was stopped because of lack of insurance coverage.  She recently received Reclast in 10/2013 when her bone density done by her gynecologist on 09/21/13 showed a T score of -3.5 at the lumbar spine.  Subsequently she has had a total of 6 Reclast infusions  She has not had any side effects from these Last infusion was 01/01/2021 with a gap of 2 years No recent fractures  Bone density in 12/17 indicated the lumbar spine T-score of -2.4, showing significant increase in BMD of Lumbar spine and left hip  Results from bone density of 09/18/2021 with comparisons as below  T-score AP Spine  L1-L4      09/18/2021    67.8         -2.5    0.886 g/cm2 AP Spine  L1-L4      01/14/2019    65.2         -2.3    0.904 g/cm2   DualFemur Neck Left  09/18/2021    67.8         -2.0    0.766 g/cm2 DualFemur Neck Left  01/14/2019    65.2         -1.8    0.781 g/cm2   DualFemur Total Mean 09/18/2021    67.8         -1.4    0.830 g/cm2 DualFemur Total Mean 01/14/2019    65.2         -1.5    0.815 g/cm2  VITAMIN D.: She has had normal levels on supplementation  Does not like to drink milk, is on Calcium tablets She is taking vitamin D  1000 units a day,   Lab Results  Component Value Date   VD25OH 38.84 10/08/2021    HYPOTHYROIDISM: Discussed in review of symptoms   Allergies as of 10/11/2021       Reactions   Codeine  Phosphate Nausea And Vomiting   Latex Itching   senitivity        Medication List        Accurate as of October 10, 2021  7:04 PM. If you have any questions, ask your nurse or doctor.          ALPRAZolam 0.25 MG tablet Commonly known as: XANAX Take 1-2 tablets (0.25-0.5 mg total) by mouth 3 (three) times daily as needed for anxiety.   azelastine 0.1 % nasal spray Commonly known as: ASTELIN 1-2 puff in each nostril   busPIRone 5 MG tablet Commonly known as: BUSPAR Start at 1 tablet twice daily.  May increase to three times daily vs 1.5 tablets twice daily   CALCIUM 500 PO Take 1 capsule by mouth 2 (two) times daily.   calcium carbonate 500 MG chewable tablet Commonly known as: TUMS - dosed in mg elemental calcium Chew 2 tablets by mouth daily.   cholecalciferol 1000 units tablet Commonly known as:  VITAMIN D Take 1,000 Units by mouth daily.   escitalopram 10 MG tablet Commonly known as: Lexapro Take 1/2 tablet by mouth every morning.  Increase to full tablet after a week if/when tolerated   Fish Oil 1000 MG Caps Take 1 capsule by mouth daily.   fluticasone 50 MCG/ACT nasal spray Commonly known as: FLONASE Place 1 spray into both nostrils daily.   levothyroxine 100 MCG tablet Commonly known as: SYNTHROID 1 tablet every morning except half tablet on Sundays   Magnesium 500 MG Caps Take 1 capsule by mouth daily.   MELATONIN PO Take 1 capsule by mouth daily.   montelukast 10 MG tablet Commonly known as: SINGULAIR Take 10 mg by mouth daily.   NON FORMULARY Take 4-6 drops by mouth 2 (two) times daily.   omeprazole 40 MG capsule Commonly known as: PRILOSEC TAKE ONE CAPSULE BY MOUTH DAILY BEFORE DINNER   ondansetron 4 MG tablet Commonly known as: Zofran Take 1 tablet (4 mg total) by mouth every 8 (eight) hours as needed.   Premarin vaginal cream Generic drug: conjugated estrogens   ProAir HFA 108 (90 Base) MCG/ACT inhaler Generic drug: albuterol    TURMERIC PO Take 1 capsule by mouth daily.   vitamin C 500 MG tablet Commonly known as: ASCORBIC ACID Take 500 mg by mouth daily.   zinc gluconate 50 MG tablet Take 50 mg by mouth daily.   zoledronic acid 5 MG/100ML Soln injection Commonly known as: RECLAST Inject 100 mLs (5 mg total) into the vein once.   ZYRTEC PO Take by mouth.        Past Medical History:  Diagnosis Date   Allergy    SEASONAL/PERENNIAL   Anxiety    Asthma    allergy related   Depression    GAD (generalized anxiety disorder)    Gastritis 2013   from NSAID's after foot surgery   Hallux rigidus    Hyperthyroidism     hx graves disease. dr Demetrios Isaacs pcp      Hypothyroidism    (since I-131 treatment)   Osteoporosis 2009   Tendinitis of right ankle 2007    Past Surgical History:  Procedure Laterality Date   BREAST EXCISIONAL BIOPSY Right 1994   benign    BREAST SURGERY     fibroadenoma, right   hallux rigidus Right 11/06/2011   keller arthroplasty Left 06/29/2021   for hallux limitus. Dr. Lucious Groves CUFF REPAIR     right   TONSILLECTOMY      Family History  Problem Relation Age of Onset   Arthritis Mother        OSTEO, vs poss RA   Hypothyroidism Mother    Ulcers Mother        in esophagus   Vision loss Mother        macular hole   Heart attack Father 17   Colon polyps Father        benign   Heart disease Father        MI at 52; stents and pacemaker 44; CHF   Hypothyroidism Father    Diabetes Brother        obese; pre-diabetic   Heart disease Brother 62       MI   Hypothyroidism Sister    Colon polyps Sister    Depression Sister        poss bipolar   Hypothyroidism Sister        Hashimoto's   Deep vein thrombosis Brother  Colon cancer Paternal Grandfather 46   Cancer Maternal Grandfather        esophageal   Esophageal cancer Maternal Grandfather 59   Breast cancer Paternal Aunt    Colon polyps Paternal Aunt    COPD Paternal Aunt    Osteoporosis Paternal Aunt     Osteoporosis Cousin    Osteoporosis Paternal Grandmother    Cancer Maternal Grandmother        leukemia   Deep vein thrombosis Other    Breast cancer Maternal Aunt    Rectal cancer Neg Hx    Stomach cancer Neg Hx     Social History:  reports that she has never smoked. She has never used smokeless tobacco. She reports current alcohol use. She reports that she does not use drugs.  Allergies:  Allergies  Allergen Reactions   Codeine Phosphate Nausea And Vomiting   Latex Itching    senitivity   ROS     Wt Readings from Last 3 Encounters:  09/19/21 131 lb (59.4 kg)  07/23/21 133 lb (60.3 kg)  06/11/21 128 lb (58.1 kg)    She has hypothyroidism which was first diagnosed after treatment for Graves' disease several years ago  She was previously taking the brand-name Synthroid, from the Synthroid direct program  Again she is taking generic preparation because of Medicare coverage  She takes her levothyroxine 100 mcg consistently in the morning before eating  Because of family stress she has had significant fatigue but no cold intolerance or weight gain as before Her dose was reduced by half tablet weekly in August because of low normal TSH  Her TSH is as follows   Lab Results  Component Value Date   TSH 5.49 10/08/2021   TSH 4.12 04/09/2021   TSH 0.49 12/04/2020   FREET4 1.00 10/08/2021   FREET4 0.91 04/09/2021   FREET4 1.03 12/04/2020      Examination:   LMP 01/04/2005       Assessments   Osteoporosis: She has postmenopausal osteoporosis without secondary factors She had a baseline T score of -3.5 along with a family history of osteoporosis  She has had Reclast infusions 6 times, with the last infusion done in 12/2020  In 01/2019 bone density had shown an improvement in her T score, with the result of -2.3 However in 5/23 her bone density is going down again and now had osteoporosis level at the spine of -2.5  Vitamin D level is therapeutic  She will  now be scheduled for Prolia injection in 8/23 for better efficacy  She will continue calcium supplements and vitamin D   Hypothyroidism, post ablative with stable TSH on current regimen of 88 mcg generic of Synthroid She is still on her dosage of levothyroxine 100 mcg, 6-1/2 tablets a week She has nonspecific fatigue because of stress However TSH is upper normal and will switch her to the 112 mcg, 6-1/2 tablets a week  Follow-up in 6 months but labs in 2 months   Charlen Bakula 10/10/2021, 7:04 PM   -

## 2021-10-11 ENCOUNTER — Encounter: Payer: Self-pay | Admitting: Endocrinology

## 2021-10-11 ENCOUNTER — Ambulatory Visit (INDEPENDENT_AMBULATORY_CARE_PROVIDER_SITE_OTHER): Payer: Medicare Other | Admitting: Endocrinology

## 2021-10-11 VITALS — BP 122/84 | HR 73 | Ht 62.0 in | Wt 133.6 lb

## 2021-10-11 DIAGNOSIS — M810 Age-related osteoporosis without current pathological fracture: Secondary | ICD-10-CM

## 2021-10-11 DIAGNOSIS — E890 Postprocedural hypothyroidism: Secondary | ICD-10-CM

## 2021-10-11 MED ORDER — LEVOTHYROXINE SODIUM 112 MCG PO TABS: ORAL_TABLET | ORAL | 3 refills | Status: DC

## 2021-10-12 ENCOUNTER — Telehealth: Payer: Self-pay

## 2021-10-12 NOTE — Telephone Encounter (Signed)
Theresa Snare, MD  Jasper Loser, CMA; Cinda Quest, CMA Please get Prolia authorized, she has failed Reclast and has T score of -2.5/osteoporosis, thanks

## 2021-10-13 NOTE — Telephone Encounter (Signed)
Prolia VOB initiated via [email or web address] ? ?New start ? ?

## 2021-10-15 DIAGNOSIS — H5203 Hypermetropia, bilateral: Secondary | ICD-10-CM | POA: Diagnosis not present

## 2021-10-15 DIAGNOSIS — H2513 Age-related nuclear cataract, bilateral: Secondary | ICD-10-CM | POA: Diagnosis not present

## 2021-10-15 DIAGNOSIS — H524 Presbyopia: Secondary | ICD-10-CM | POA: Diagnosis not present

## 2021-10-16 DIAGNOSIS — N3941 Urge incontinence: Secondary | ICD-10-CM | POA: Diagnosis not present

## 2021-10-22 NOTE — Telephone Encounter (Signed)
Pt ready for scheduling on or after 10/22/21  Out-of-pocket cost due at time of visit: $0  Primary: Medicare Prolia co-insurance: 20% (approximately $276) Admin fee co-insurance: 20% (approximately $25)  Secondary: [employer] Medicare Supp Prolia co-insurance: Covers Medicare Part B co-insurance Admin fee co-insurance: Covers Medicare Part B co-insurance  Deductible: $226 of $226 met  Prior Auth: not required PA# Valid:   ** This summary of benefits is an estimation of the patient's out-of-pocket cost. Exact cost may vary based on individual plan coverage.

## 2021-10-30 DIAGNOSIS — F4381 Prolonged grief disorder: Secondary | ICD-10-CM | POA: Diagnosis not present

## 2021-10-30 DIAGNOSIS — F439 Reaction to severe stress, unspecified: Secondary | ICD-10-CM | POA: Diagnosis not present

## 2021-10-30 DIAGNOSIS — F4323 Adjustment disorder with mixed anxiety and depressed mood: Secondary | ICD-10-CM | POA: Diagnosis not present

## 2021-11-02 ENCOUNTER — Other Ambulatory Visit: Payer: Self-pay | Admitting: Family Medicine

## 2021-11-02 DIAGNOSIS — Z1231 Encounter for screening mammogram for malignant neoplasm of breast: Secondary | ICD-10-CM

## 2021-11-08 DIAGNOSIS — F439 Reaction to severe stress, unspecified: Secondary | ICD-10-CM | POA: Diagnosis not present

## 2021-11-08 DIAGNOSIS — F4323 Adjustment disorder with mixed anxiety and depressed mood: Secondary | ICD-10-CM | POA: Diagnosis not present

## 2021-11-08 DIAGNOSIS — F4381 Prolonged grief disorder: Secondary | ICD-10-CM | POA: Diagnosis not present

## 2021-11-13 DIAGNOSIS — N3941 Urge incontinence: Secondary | ICD-10-CM | POA: Diagnosis not present

## 2021-11-21 DIAGNOSIS — F4323 Adjustment disorder with mixed anxiety and depressed mood: Secondary | ICD-10-CM | POA: Diagnosis not present

## 2021-11-21 DIAGNOSIS — F4381 Prolonged grief disorder: Secondary | ICD-10-CM | POA: Diagnosis not present

## 2021-12-05 DIAGNOSIS — F439 Reaction to severe stress, unspecified: Secondary | ICD-10-CM | POA: Diagnosis not present

## 2021-12-05 DIAGNOSIS — F4323 Adjustment disorder with mixed anxiety and depressed mood: Secondary | ICD-10-CM | POA: Diagnosis not present

## 2021-12-05 DIAGNOSIS — F4381 Prolonged grief disorder: Secondary | ICD-10-CM | POA: Diagnosis not present

## 2021-12-07 ENCOUNTER — Ambulatory Visit
Admission: RE | Admit: 2021-12-07 | Discharge: 2021-12-07 | Disposition: A | Discharge status: Home / Self Care | Payer: Medicare Other | Source: Ambulatory Visit | Attending: Family Medicine | Admitting: Family Medicine

## 2021-12-07 DIAGNOSIS — Z1231 Encounter for screening mammogram for malignant neoplasm of breast: Secondary | ICD-10-CM

## 2021-12-11 DIAGNOSIS — N3941 Urge incontinence: Secondary | ICD-10-CM | POA: Diagnosis not present

## 2021-12-12 ENCOUNTER — Other Ambulatory Visit (INDEPENDENT_AMBULATORY_CARE_PROVIDER_SITE_OTHER): Payer: Medicare Other

## 2021-12-12 DIAGNOSIS — E890 Postprocedural hypothyroidism: Secondary | ICD-10-CM | POA: Diagnosis not present

## 2021-12-12 LAB — T4, FREE: Free T4: 1.05 ng/dL (ref 0.60–1.60)

## 2021-12-12 LAB — TSH: TSH: 0.51 u[IU]/mL (ref 0.35–5.50)

## 2021-12-12 NOTE — Progress Notes (Signed)
Please call to let patient know that the thyroid level is okay and to continue same dose

## 2021-12-14 ENCOUNTER — Ambulatory Visit: Payer: Medicare Other

## 2021-12-19 ENCOUNTER — Ambulatory Visit (INDEPENDENT_AMBULATORY_CARE_PROVIDER_SITE_OTHER): Payer: Medicare Other

## 2021-12-19 DIAGNOSIS — M810 Age-related osteoporosis without current pathological fracture: Secondary | ICD-10-CM

## 2021-12-19 MED ORDER — DENOSUMAB 60 MG/ML ~~LOC~~ SOSY
60.0000 mg | PREFILLED_SYRINGE | Freq: Once | SUBCUTANEOUS | Status: AC
  Administered 2021-12-19: 60 mg via SUBCUTANEOUS

## 2021-12-19 NOTE — Progress Notes (Signed)
Patient seen today for Prolia injection.  60mg/ml given in left arm subQ.  Patient tolerated well.  Follow up 6 months or as advised by provider.Patient verified name, DOB and provided verbal consent prior to administration. 

## 2021-12-20 DIAGNOSIS — F4323 Adjustment disorder with mixed anxiety and depressed mood: Secondary | ICD-10-CM | POA: Diagnosis not present

## 2021-12-20 DIAGNOSIS — F4381 Prolonged grief disorder: Secondary | ICD-10-CM | POA: Diagnosis not present

## 2021-12-20 DIAGNOSIS — F439 Reaction to severe stress, unspecified: Secondary | ICD-10-CM | POA: Diagnosis not present

## 2022-01-01 DIAGNOSIS — F4381 Prolonged grief disorder: Secondary | ICD-10-CM | POA: Diagnosis not present

## 2022-01-01 DIAGNOSIS — F439 Reaction to severe stress, unspecified: Secondary | ICD-10-CM | POA: Diagnosis not present

## 2022-01-01 DIAGNOSIS — F4323 Adjustment disorder with mixed anxiety and depressed mood: Secondary | ICD-10-CM | POA: Diagnosis not present

## 2022-01-03 NOTE — Telephone Encounter (Signed)
Last Prolia inj 12/19/21 Next Prolia inj due 06/22/22 

## 2022-01-08 DIAGNOSIS — R350 Frequency of micturition: Secondary | ICD-10-CM | POA: Diagnosis not present

## 2022-01-08 DIAGNOSIS — R3915 Urgency of urination: Secondary | ICD-10-CM | POA: Diagnosis not present

## 2022-01-08 DIAGNOSIS — N3941 Urge incontinence: Secondary | ICD-10-CM | POA: Diagnosis not present

## 2022-01-09 ENCOUNTER — Encounter: Payer: Self-pay | Admitting: Internal Medicine

## 2022-01-17 DIAGNOSIS — F4323 Adjustment disorder with mixed anxiety and depressed mood: Secondary | ICD-10-CM | POA: Diagnosis not present

## 2022-01-17 DIAGNOSIS — F4381 Prolonged grief disorder: Secondary | ICD-10-CM | POA: Diagnosis not present

## 2022-01-17 DIAGNOSIS — F439 Reaction to severe stress, unspecified: Secondary | ICD-10-CM | POA: Diagnosis not present

## 2022-01-24 DIAGNOSIS — F4381 Prolonged grief disorder: Secondary | ICD-10-CM | POA: Diagnosis not present

## 2022-01-24 DIAGNOSIS — F439 Reaction to severe stress, unspecified: Secondary | ICD-10-CM | POA: Diagnosis not present

## 2022-01-24 DIAGNOSIS — F4323 Adjustment disorder with mixed anxiety and depressed mood: Secondary | ICD-10-CM | POA: Diagnosis not present

## 2022-02-07 DIAGNOSIS — N3941 Urge incontinence: Secondary | ICD-10-CM | POA: Diagnosis not present

## 2022-02-08 DIAGNOSIS — J3081 Allergic rhinitis due to animal (cat) (dog) hair and dander: Secondary | ICD-10-CM | POA: Diagnosis not present

## 2022-02-08 DIAGNOSIS — J4530 Mild persistent asthma, uncomplicated: Secondary | ICD-10-CM | POA: Diagnosis not present

## 2022-02-08 DIAGNOSIS — Z23 Encounter for immunization: Secondary | ICD-10-CM | POA: Diagnosis not present

## 2022-02-08 DIAGNOSIS — J3089 Other allergic rhinitis: Secondary | ICD-10-CM | POA: Diagnosis not present

## 2022-02-08 DIAGNOSIS — H1045 Other chronic allergic conjunctivitis: Secondary | ICD-10-CM | POA: Diagnosis not present

## 2022-02-12 ENCOUNTER — Encounter: Payer: Self-pay | Admitting: Internal Medicine

## 2022-02-12 DIAGNOSIS — F4323 Adjustment disorder with mixed anxiety and depressed mood: Secondary | ICD-10-CM | POA: Diagnosis not present

## 2022-02-12 DIAGNOSIS — F439 Reaction to severe stress, unspecified: Secondary | ICD-10-CM | POA: Diagnosis not present

## 2022-02-12 DIAGNOSIS — F4381 Prolonged grief disorder: Secondary | ICD-10-CM | POA: Diagnosis not present

## 2022-02-21 DIAGNOSIS — F4323 Adjustment disorder with mixed anxiety and depressed mood: Secondary | ICD-10-CM | POA: Diagnosis not present

## 2022-02-21 DIAGNOSIS — F4381 Prolonged grief disorder: Secondary | ICD-10-CM | POA: Diagnosis not present

## 2022-02-21 DIAGNOSIS — F439 Reaction to severe stress, unspecified: Secondary | ICD-10-CM | POA: Diagnosis not present

## 2022-03-05 DIAGNOSIS — R350 Frequency of micturition: Secondary | ICD-10-CM | POA: Diagnosis not present

## 2022-03-08 DIAGNOSIS — F4323 Adjustment disorder with mixed anxiety and depressed mood: Secondary | ICD-10-CM | POA: Diagnosis not present

## 2022-03-08 DIAGNOSIS — F439 Reaction to severe stress, unspecified: Secondary | ICD-10-CM | POA: Diagnosis not present

## 2022-03-08 DIAGNOSIS — F4381 Prolonged grief disorder: Secondary | ICD-10-CM | POA: Diagnosis not present

## 2022-03-13 DIAGNOSIS — F4323 Adjustment disorder with mixed anxiety and depressed mood: Secondary | ICD-10-CM | POA: Diagnosis not present

## 2022-03-13 DIAGNOSIS — F439 Reaction to severe stress, unspecified: Secondary | ICD-10-CM | POA: Diagnosis not present

## 2022-03-13 DIAGNOSIS — F4381 Prolonged grief disorder: Secondary | ICD-10-CM | POA: Diagnosis not present

## 2022-03-20 DIAGNOSIS — F4381 Prolonged grief disorder: Secondary | ICD-10-CM | POA: Diagnosis not present

## 2022-03-20 DIAGNOSIS — F4323 Adjustment disorder with mixed anxiety and depressed mood: Secondary | ICD-10-CM | POA: Diagnosis not present

## 2022-03-20 DIAGNOSIS — F439 Reaction to severe stress, unspecified: Secondary | ICD-10-CM | POA: Diagnosis not present

## 2022-04-02 DIAGNOSIS — N3941 Urge incontinence: Secondary | ICD-10-CM | POA: Diagnosis not present

## 2022-04-03 DIAGNOSIS — F4381 Prolonged grief disorder: Secondary | ICD-10-CM | POA: Diagnosis not present

## 2022-04-03 DIAGNOSIS — F439 Reaction to severe stress, unspecified: Secondary | ICD-10-CM | POA: Diagnosis not present

## 2022-04-03 DIAGNOSIS — F4323 Adjustment disorder with mixed anxiety and depressed mood: Secondary | ICD-10-CM | POA: Diagnosis not present

## 2022-04-05 ENCOUNTER — Other Ambulatory Visit: Payer: Self-pay | Admitting: Endocrinology

## 2022-04-05 DIAGNOSIS — E890 Postprocedural hypothyroidism: Secondary | ICD-10-CM

## 2022-04-05 DIAGNOSIS — M810 Age-related osteoporosis without current pathological fracture: Secondary | ICD-10-CM

## 2022-04-08 ENCOUNTER — Other Ambulatory Visit (INDEPENDENT_AMBULATORY_CARE_PROVIDER_SITE_OTHER): Payer: Medicare Other

## 2022-04-08 DIAGNOSIS — E890 Postprocedural hypothyroidism: Secondary | ICD-10-CM | POA: Diagnosis not present

## 2022-04-08 DIAGNOSIS — M810 Age-related osteoporosis without current pathological fracture: Secondary | ICD-10-CM

## 2022-04-08 LAB — BASIC METABOLIC PANEL
BUN: 11 mg/dL (ref 6–23)
CO2: 27 mEq/L (ref 19–32)
Calcium: 9.5 mg/dL (ref 8.4–10.5)
Chloride: 105 mEq/L (ref 96–112)
Creatinine, Ser: 0.67 mg/dL (ref 0.40–1.20)
GFR: 89.79 mL/min (ref >60.00)
Glucose, Bld: 75 mg/dL (ref 70–99)
Potassium: 4.1 mEq/L (ref 3.5–5.1)
Sodium: 138 mEq/L (ref 135–145)

## 2022-04-08 LAB — TSH: TSH: 0.21 u[IU]/mL — ABNORMAL LOW (ref 0.35–5.50)

## 2022-04-08 LAB — T4, FREE: Free T4: 1.12 ng/dL (ref 0.60–1.60)

## 2022-04-08 LAB — VITAMIN D 25 HYDROXY (VIT D DEFICIENCY, FRACTURES): VITD: 53.2 ng/mL (ref 30.00–100.00)

## 2022-04-10 DIAGNOSIS — F4381 Prolonged grief disorder: Secondary | ICD-10-CM | POA: Diagnosis not present

## 2022-04-10 DIAGNOSIS — F4323 Adjustment disorder with mixed anxiety and depressed mood: Secondary | ICD-10-CM | POA: Diagnosis not present

## 2022-04-10 DIAGNOSIS — F439 Reaction to severe stress, unspecified: Secondary | ICD-10-CM | POA: Diagnosis not present

## 2022-04-11 ENCOUNTER — Ambulatory Visit: Payer: Medicare Other | Admitting: Endocrinology

## 2022-04-17 DIAGNOSIS — F4323 Adjustment disorder with mixed anxiety and depressed mood: Secondary | ICD-10-CM | POA: Diagnosis not present

## 2022-04-17 DIAGNOSIS — F4381 Prolonged grief disorder: Secondary | ICD-10-CM | POA: Diagnosis not present

## 2022-04-17 DIAGNOSIS — F439 Reaction to severe stress, unspecified: Secondary | ICD-10-CM | POA: Diagnosis not present

## 2022-04-22 ENCOUNTER — Encounter: Payer: Self-pay | Admitting: Endocrinology

## 2022-04-22 ENCOUNTER — Ambulatory Visit (INDEPENDENT_AMBULATORY_CARE_PROVIDER_SITE_OTHER): Payer: Medicare Other | Admitting: Endocrinology

## 2022-04-22 VITALS — BP 108/70 | HR 76 | Ht 62.0 in | Wt 130.2 lb

## 2022-04-22 DIAGNOSIS — M810 Age-related osteoporosis without current pathological fracture: Secondary | ICD-10-CM

## 2022-04-22 DIAGNOSIS — E890 Postprocedural hypothyroidism: Secondary | ICD-10-CM

## 2022-04-22 NOTE — Progress Notes (Signed)
Patient ID: Theresa Schneider, female   DOB: June 01, 1955, 66 y.o.   MRN: [id number]    Reason for Appointment: Follow-up of thyroid and of osteoporosis   History of Present Illness:   Osteoporosis history: She has previously been evaluated and treated by her gynecologist since about 2004. She was given a trial of Actonel about 10 years ago. She  had difficulty tolerating this because of abdominal discomfort and not clear if she took this regularly, probably not over 2 years  She was subsequently given Prolia starting in about 2012 and she got only 3 injections. The injection was stopped because of lack of insurance coverage.  She recently received Reclast in 10/2013 when her bone density done by her gynecologist on 09/21/13 showed a T score of -3.5 at the lumbar spine.  Subsequently she has had a total of 6 Reclast infusions  Last infusion was 01/01/2021 with a gap of 2 years  Bone density in 12/17 indicated the lumbar spine T-score of -2.4, showing significant increase in BMD of Lumbar spine and left hip   Since her bone density was showing slight decrease at the spine in 2023 she is now taking PROLIA with the first injection on 12/19/2021 No side effects with the Prolia, also has taken this previously in 2012 No recent back pain or other fractures No height loss  Results from bone density of 09/18/2021 with comparisons as below  T-score AP Spine  L1-L4      09/18/2021    67.8         -2.5    0.886 g/cm2 AP Spine  L1-L4      01/14/2019    65.2         -2.3    0.904 g/cm2   DualFemur Neck Left  09/18/2021    67.8         -2.0    0.766 g/cm2 DualFemur Neck Left  01/14/2019    65.2         -1.8    0.781 g/cm2   DualFemur Total Mean 09/18/2021    67.8         -1.4    0.830 g/cm2 DualFemur Total Mean 01/14/2019    65.2         -1.5    0.815 g/cm2  VITAMIN D.: She has had normal levels on supplementation  Does not like to drink milk, is on Calcium tablets She is taking vitamin D   1000 units a day,   Lab Results  Component Value Date   VD25OH 53.20 04/08/2022    HYPOTHYROIDISM: Discussed in review of symptoms   Allergies as of 04/22/2022       Reactions   Codeine Phosphate Nausea And Vomiting   Latex Itching   senitivity        Medication List        Accurate as of April 22, 2022 10:39 AM. If you have any questions, ask your nurse or doctor.          ALPRAZolam 0.25 MG tablet Commonly known as: XANAX Take 1-2 tablets (0.25-0.5 mg total) by mouth 3 (three) times daily as needed for anxiety.   ascorbic acid 500 MG tablet Commonly known as: VITAMIN C Take 500 mg by mouth daily.   azelastine 0.1 % nasal spray Commonly known as: ASTELIN 1-2 puff in each nostril   busPIRone 5 MG tablet Commonly known as: BUSPAR Start at 1 tablet twice daily.  May increase to three  times daily vs 1.5 tablets twice daily   CALCIUM 500 PO Take 1 capsule by mouth 2 (two) times daily.   calcium carbonate 500 MG chewable tablet Commonly known as: TUMS - dosed in mg elemental calcium Chew 2 tablets by mouth daily.   cholecalciferol 1000 units tablet Commonly known as: VITAMIN D Take 1,000 Units by mouth daily.   Fish Oil 1000 MG Caps Take 1 capsule by mouth daily.   fluticasone 50 MCG/ACT nasal spray Commonly known as: FLONASE Place 1 spray into both nostrils daily.   levothyroxine 112 MCG tablet Commonly known as: SYNTHROID 1 tablet Monday through Friday and half tablet on Sundays   Magnesium 500 MG Caps Take 1 capsule by mouth daily.   MELATONIN PO Take 1 capsule by mouth daily.   montelukast 10 MG tablet Commonly known as: SINGULAIR Take 10 mg by mouth daily.   NON FORMULARY Take 4-6 drops by mouth 2 (two) times daily.   omeprazole 40 MG capsule Commonly known as: PRILOSEC TAKE ONE CAPSULE BY MOUTH DAILY BEFORE DINNER   ondansetron 4 MG tablet Commonly known as: Zofran Take 1 tablet (4 mg total) by mouth every 8 (eight) hours as  needed.   Premarin vaginal cream Generic drug: conjugated estrogens   ProAir HFA 108 (90 Base) MCG/ACT inhaler Generic drug: albuterol   TURMERIC PO Take 1 capsule by mouth daily.   zinc gluconate 50 MG tablet Take 50 mg by mouth daily.   zoledronic acid 5 MG/100ML Soln injection Commonly known as: RECLAST Inject 100 mLs (5 mg total) into the vein once.   ZYRTEC PO Take by mouth.        Past Medical History:  Diagnosis Date   Allergy    SEASONAL/PERENNIAL   Anxiety    Asthma    allergy related   Depression    GAD (generalized anxiety disorder)    Gastritis 2013   from NSAID's after foot surgery   Hallux rigidus    Hyperthyroidism     hx graves disease. dr Demetrios Isaacs pcp      Hypothyroidism    (since I-131 treatment)   Osteoporosis 2009   Tendinitis of right ankle 2007    Past Surgical History:  Procedure Laterality Date   BREAST EXCISIONAL BIOPSY Right 1994   benign    BREAST SURGERY     fibroadenoma, right   hallux rigidus Right 11/06/2011   keller arthroplasty Left 06/29/2021   for hallux limitus. Dr. Lucious Groves CUFF REPAIR     right   TONSILLECTOMY      Family History  Problem Relation Age of Onset   Arthritis Mother        OSTEO, vs poss RA   Hypothyroidism Mother    Ulcers Mother        in esophagus   Vision loss Mother        macular hole   Heart attack Father 80   Colon polyps Father        benign   Heart disease Father        MI at 29; stents and pacemaker 34; CHF   Hypothyroidism Father    Diabetes Brother        obese; pre-diabetic   Heart disease Brother 91       MI   Hypothyroidism Sister    Colon polyps Sister    Depression Sister        poss bipolar   Hypothyroidism Sister  Hashimoto's   Deep vein thrombosis Brother    Colon cancer Paternal Grandfather 22   Cancer Maternal Grandfather        esophageal   Esophageal cancer Maternal Grandfather 36   Breast cancer Paternal Aunt    Colon polyps Paternal Aunt     COPD Paternal Aunt    Osteoporosis Paternal Aunt    Osteoporosis Cousin    Osteoporosis Paternal Grandmother    Cancer Maternal Grandmother        leukemia   Deep vein thrombosis Other    Breast cancer Maternal Aunt    Rectal cancer Neg Hx    Stomach cancer Neg Hx     Social History:  reports that she has never smoked. She has never used smokeless tobacco. She reports current alcohol use. She reports that she does not use drugs.  Allergies:  Allergies  Allergen Reactions   Codeine Phosphate Nausea And Vomiting   Latex Itching    senitivity   ROS     Wt Readings from Last 3 Encounters:  04/22/22 130 lb 3.2 oz (59.1 kg)  10/11/21 133 lb 9.6 oz (60.6 kg)  09/19/21 131 lb (59.4 kg)    She has hypothyroidism which was first diagnosed after treatment for Graves' disease several years ago  She was previously taking the brand-name Synthroid, from the Synthroid direct program  Now she is taking generic preparation because of Medicare coverage  She takes her levothyroxine very regularly Since TSH was high in June she is now taking 112 mcg, 6-1/2 tablets a week with improvement in TSH in August She cannot tell the difference in how she feels with any dosage change Currently TSH is 0.21   Lab Results  Component Value Date   TSH 0.21 (L) 04/08/2022   TSH 0.51 12/12/2021   TSH 5.49 10/08/2021   FREET4 1.12 04/08/2022   FREET4 1.05 12/12/2021   FREET4 1.00 10/08/2021      Examination:   BP 108/70   Pulse 76   Ht '5\' 2"'$  (1.575 m)   Wt 130 lb 3.2 oz (59.1 kg)   LMP 01/04/2005   SpO2 97%   BMI 23.81 kg/m       Assessments   Osteoporosis: She has postmenopausal osteoporosis without secondary factors She had a baseline T score of -3.5 along with a family history of osteoporosis  She has had Reclast infusions 6 times, with the last infusion done in 12/2020  However in 5/23 her bone density was decreased and now she is on Prolia without side effects  Vitamin D  level is therapeutic  She will be scheduled for Prolia injection every 6 months  She will continue calcium supplements and vitamin D   Hypothyroidism, post ablative with stable TSH on current regimen of 88 mcg generic of Synthroid She is on her new dosage of levothyroxine 112 mcg, 6-1/2 tablets a week She has nonspecific fatigue because of stress However TSH is slightly below normal and will switch her to the 112 mcg, 6 days a week only  Follow-up in 6 months again   Elayne Snare 04/22/2022, 10:39 AM   -

## 2022-04-22 NOTE — Patient Instructions (Signed)
Take Thyroid 6 days a week

## 2022-04-24 DIAGNOSIS — F4381 Prolonged grief disorder: Secondary | ICD-10-CM | POA: Diagnosis not present

## 2022-04-24 DIAGNOSIS — F4323 Adjustment disorder with mixed anxiety and depressed mood: Secondary | ICD-10-CM | POA: Diagnosis not present

## 2022-04-24 DIAGNOSIS — F439 Reaction to severe stress, unspecified: Secondary | ICD-10-CM | POA: Diagnosis not present

## 2022-05-02 DIAGNOSIS — N3941 Urge incontinence: Secondary | ICD-10-CM | POA: Diagnosis not present

## 2022-05-02 DIAGNOSIS — R350 Frequency of micturition: Secondary | ICD-10-CM | POA: Diagnosis not present

## 2022-05-15 DIAGNOSIS — F4381 Prolonged grief disorder: Secondary | ICD-10-CM | POA: Diagnosis not present

## 2022-05-15 DIAGNOSIS — F439 Reaction to severe stress, unspecified: Secondary | ICD-10-CM | POA: Diagnosis not present

## 2022-05-15 DIAGNOSIS — F4323 Adjustment disorder with mixed anxiety and depressed mood: Secondary | ICD-10-CM | POA: Diagnosis not present

## 2022-05-20 ENCOUNTER — Encounter: Payer: Self-pay | Admitting: Internal Medicine

## 2022-05-21 DIAGNOSIS — F4381 Prolonged grief disorder: Secondary | ICD-10-CM | POA: Diagnosis not present

## 2022-05-21 DIAGNOSIS — F439 Reaction to severe stress, unspecified: Secondary | ICD-10-CM | POA: Diagnosis not present

## 2022-05-21 DIAGNOSIS — F4323 Adjustment disorder with mixed anxiety and depressed mood: Secondary | ICD-10-CM | POA: Diagnosis not present

## 2022-05-23 NOTE — Telephone Encounter (Signed)
Prolia VOB initiated via [email or web address]  Last Prolia inj 12/19/21 Next Prolia inj due 06/22/22

## 2022-05-27 ENCOUNTER — Other Ambulatory Visit (HOSPITAL_BASED_OUTPATIENT_CLINIC_OR_DEPARTMENT_OTHER): Payer: Self-pay

## 2022-05-27 ENCOUNTER — Encounter: Payer: Self-pay | Admitting: Internal Medicine

## 2022-06-03 ENCOUNTER — Other Ambulatory Visit (HOSPITAL_BASED_OUTPATIENT_CLINIC_OR_DEPARTMENT_OTHER): Payer: Self-pay

## 2022-06-03 NOTE — Telephone Encounter (Signed)
Pt ready for scheduling on or after 06/22/22  Out-of-pocket cost due at time of visit: $240 (Medicare deductible)  Primary: Medicare Prolia co-insurance: 20% (approximately $302) Admin fee co-insurance: 20% (approximately $25)  Deductible: $0 of $240 met  Secondary: [employer] Medicare Supp Plan G Prolia co-insurance: Covers Medicare Part B co-insurance Admin fee co-insurance: Covers Medicare Part B co-insurance  Deductible: does NOT cover Medicare deductible of which $0 of $240 has been met  Prior Auth: NOT required PA# Valid:   ** This summary of benefits is an estimation of the patient's out-of-pocket cost. Exact cost may vary based on individual plan coverage.

## 2022-06-04 DIAGNOSIS — N3941 Urge incontinence: Secondary | ICD-10-CM | POA: Diagnosis not present

## 2022-06-07 DIAGNOSIS — F4323 Adjustment disorder with mixed anxiety and depressed mood: Secondary | ICD-10-CM | POA: Diagnosis not present

## 2022-06-07 DIAGNOSIS — F439 Reaction to severe stress, unspecified: Secondary | ICD-10-CM | POA: Diagnosis not present

## 2022-06-07 DIAGNOSIS — F4381 Prolonged grief disorder: Secondary | ICD-10-CM | POA: Diagnosis not present

## 2022-06-12 DIAGNOSIS — F4381 Prolonged grief disorder: Secondary | ICD-10-CM | POA: Diagnosis not present

## 2022-06-12 DIAGNOSIS — F4323 Adjustment disorder with mixed anxiety and depressed mood: Secondary | ICD-10-CM | POA: Diagnosis not present

## 2022-06-18 DIAGNOSIS — F4323 Adjustment disorder with mixed anxiety and depressed mood: Secondary | ICD-10-CM | POA: Diagnosis not present

## 2022-06-18 DIAGNOSIS — F4381 Prolonged grief disorder: Secondary | ICD-10-CM | POA: Diagnosis not present

## 2022-06-18 DIAGNOSIS — F439 Reaction to severe stress, unspecified: Secondary | ICD-10-CM | POA: Diagnosis not present

## 2022-06-25 ENCOUNTER — Ambulatory Visit (INDEPENDENT_AMBULATORY_CARE_PROVIDER_SITE_OTHER): Payer: Medicare Other

## 2022-06-25 VITALS — BP 130/82 | HR 88

## 2022-06-25 DIAGNOSIS — R350 Frequency of micturition: Secondary | ICD-10-CM | POA: Diagnosis not present

## 2022-06-25 DIAGNOSIS — M810 Age-related osteoporosis without current pathological fracture: Secondary | ICD-10-CM

## 2022-06-25 DIAGNOSIS — R3915 Urgency of urination: Secondary | ICD-10-CM | POA: Diagnosis not present

## 2022-06-25 MED ORDER — DENOSUMAB 60 MG/ML ~~LOC~~ SOSY
60.0000 mg | PREFILLED_SYRINGE | Freq: Once | SUBCUTANEOUS | Status: AC
  Administered 2022-06-25: 60 mg via SUBCUTANEOUS

## 2022-06-25 NOTE — Progress Notes (Signed)
Patient seen today for Prolia injection.  89m/ml given in left arm subQ.  Patient tolerated well.  Follow up 6 months or as advised by provider.

## 2022-06-27 DIAGNOSIS — F439 Reaction to severe stress, unspecified: Secondary | ICD-10-CM | POA: Diagnosis not present

## 2022-06-27 DIAGNOSIS — F4381 Prolonged grief disorder: Secondary | ICD-10-CM | POA: Diagnosis not present

## 2022-06-27 DIAGNOSIS — F4323 Adjustment disorder with mixed anxiety and depressed mood: Secondary | ICD-10-CM | POA: Diagnosis not present

## 2022-07-01 ENCOUNTER — Encounter: Payer: Self-pay | Admitting: Family Medicine

## 2022-07-01 ENCOUNTER — Ambulatory Visit (AMBULATORY_SURGERY_CENTER): Payer: Medicare Other

## 2022-07-01 ENCOUNTER — Other Ambulatory Visit: Payer: Self-pay | Admitting: *Deleted

## 2022-07-01 VITALS — Ht 62.0 in | Wt 129.0 lb

## 2022-07-01 DIAGNOSIS — Z1211 Encounter for screening for malignant neoplasm of colon: Secondary | ICD-10-CM

## 2022-07-01 DIAGNOSIS — Z129 Encounter for screening for malignant neoplasm, site unspecified: Secondary | ICD-10-CM

## 2022-07-01 DIAGNOSIS — Z800 Family history of malignant neoplasm of digestive organs: Secondary | ICD-10-CM

## 2022-07-01 MED ORDER — PEG 3350-KCL-NA BICARB-NACL 420 G PO SOLR: 4000.0000 mL | Freq: Once | ORAL | 0 refills | Status: AC

## 2022-07-01 NOTE — Progress Notes (Signed)

## 2022-07-02 DIAGNOSIS — F4381 Prolonged grief disorder: Secondary | ICD-10-CM | POA: Diagnosis not present

## 2022-07-02 DIAGNOSIS — F439 Reaction to severe stress, unspecified: Secondary | ICD-10-CM | POA: Diagnosis not present

## 2022-07-02 DIAGNOSIS — F4323 Adjustment disorder with mixed anxiety and depressed mood: Secondary | ICD-10-CM | POA: Diagnosis not present

## 2022-07-10 DIAGNOSIS — F439 Reaction to severe stress, unspecified: Secondary | ICD-10-CM | POA: Diagnosis not present

## 2022-07-10 DIAGNOSIS — F4323 Adjustment disorder with mixed anxiety and depressed mood: Secondary | ICD-10-CM | POA: Diagnosis not present

## 2022-07-10 DIAGNOSIS — F4381 Prolonged grief disorder: Secondary | ICD-10-CM | POA: Diagnosis not present

## 2022-07-17 DIAGNOSIS — F4323 Adjustment disorder with mixed anxiety and depressed mood: Secondary | ICD-10-CM | POA: Diagnosis not present

## 2022-07-17 DIAGNOSIS — F4381 Prolonged grief disorder: Secondary | ICD-10-CM | POA: Diagnosis not present

## 2022-07-17 DIAGNOSIS — F439 Reaction to severe stress, unspecified: Secondary | ICD-10-CM | POA: Diagnosis not present

## 2022-07-21 ENCOUNTER — Encounter: Payer: Self-pay | Admitting: Family Medicine

## 2022-07-22 ENCOUNTER — Encounter: Payer: Self-pay | Admitting: Family Medicine

## 2022-07-22 ENCOUNTER — Telehealth (INDEPENDENT_AMBULATORY_CARE_PROVIDER_SITE_OTHER): Payer: Medicare Other | Admitting: Family Medicine

## 2022-07-22 VITALS — BP 122/83 | Ht 62.0 in | Wt 130.6 lb

## 2022-07-22 DIAGNOSIS — G4700 Insomnia, unspecified: Secondary | ICD-10-CM | POA: Diagnosis not present

## 2022-07-22 DIAGNOSIS — F411 Generalized anxiety disorder: Secondary | ICD-10-CM | POA: Diagnosis not present

## 2022-07-22 MED ORDER — ALPRAZOLAM 0.25 MG PO TABS: 0.2500 mg | ORAL_TABLET | Freq: Three times a day (TID) | ORAL | 0 refills | Status: DC | PRN

## 2022-07-22 MED ORDER — SERTRALINE HCL 50 MG PO TABS: ORAL_TABLET | ORAL | 2 refills | Status: DC

## 2022-07-22 NOTE — Progress Notes (Signed)
Start time: 3:31 End time: 4:01  Virtual Visit via Video Note  I connected with Theresa Schneider on 07/22/22 by a video enabled telemedicine application and verified that I am speaking with the correct person using two identifiers.  Location: Patient: outside at [facility], by the [place]. Provider: office Patient can see/hear me on video; I can't see or hear her, used telephone for audio   I discussed the limitations of evaluation and management by telemedicine and the availability of in person appointments. The patient expressed understanding and agreed to proceed.  History of Present Illness:  Chief Complaint  Patient presents with   Stress    VIRTUAL increased stress and exhaustion. Theresa Schneider is back in the hospital and her mother and father are now in independent living. Waking up all night. She is scared to take medication to sleep. She has been taking 1/2 benadryl which is only letting her sleep 5 hours. Took 1/2 of her daughters trazodone. Melatonin no longer working.    Patient presents with complaints of stress and exhaustion, not sleeping well.  Stressors include-- Theresa Schneider has been in the hospital monthly since November, related to aspiration pneumonia.  He is back on tube feedings. He has been on continuous TF since November, rather than bolus. She is aware that he is slowly dying. Parents recently moved into [employer] (last week), into independent living.  They aren't acclimating.  They are contracting people at the facilities to do things for them, but things are getting added on, getting expensive. (Ie father needs help with bathing, can't get compression socks on, both parents having incontinence issues.)  She has been having more trouble sleeping, worse in the last few months. She falls asleep, can sleep for 5 mins, 20 mins, sometimes a few hours, can never sleep all night. Sometimes she gets up at 5 and can't get back to sleep. She needs to get up at 6 to start  his feeding, when husband is home. She would like something to help her sleep, but she is worried about being sedated when he is home, in case he needs something.  She took 1/2 of daughter's trazodone, twice (unsure of the dose), and it worked better than the benadryl did. She didn't have any side effects or feel groggy the next morning.  She continues with counseling, weekly.  Still has 2 pills left from 10/2020 rx of alprazolam. Asking for refill. She previously took Lexapro 10mg , prescribed in 10/2020, stopped due to feeling like a zombie.  PMH, PSH, SH reviewed  Outpatient Encounter Medications as of 07/22/2022  Medication Sig Note   azelastine (ASTELIN) 0.1 % nasal spray 1-2 puff in each nostril 09/19/2021: In the am, if needed   B Complex-Folic Acid (B COMPLEX VITAMINS, W/ FA,) CAPS Take by mouth.    calcium carbonate (CALCIUM 600) 600 MG TABS tablet Take 600 mg by mouth daily.    Cetirizine HCl (ZYRTEC PO) Take 10 mg by mouth at bedtime.    cholecalciferol (VITAMIN D) 1000 units tablet Take 1,000 Units by mouth daily. 5000 units in the winter    CO ENZYME Q-10 PO Take 100 mg by mouth daily.    Denosumab (PROLIA [place]) Inject into the skin every 6 (six) months.    fluticasone (FLONASE) 50 MCG/ACT nasal spray Place 1 spray into both nostrils daily. 07/23/2021: nightly   levothyroxine (SYNTHROID) 112 MCG tablet Take 112 mcg by mouth daily before breakfast. 07/22/2022: 112 daily except takes half tab on Th  and Sun   montelukast (SINGULAIR) 10 MG tablet Take 10 mg by mouth daily. 09/13/2019: Doesn't always take every day   NON FORMULARY Take 4-6 drops by mouth daily. 10/12/2020: CBD OIL    PREMARIN vaginal cream 1 applicator. 07/22/2022: As needed   sertraline (ZOLOFT) 50 MG tablet Take 1/2 tablet by mouth at bedtime. After 1-2 weeks, increase to the full pill, if tolerated    TURMERIC PO Take 1 capsule by mouth daily.    vitamin C (ASCORBIC ACID) 500 MG tablet Take 500 mg by mouth daily.    zinc  gluconate 50 MG tablet Take 50 mg by mouth daily. 07/01/2022: Daily in the winter for immune support   [DISCONTINUED] Calcium-Magnesium-Vitamin D (CALCIUM 500 PO) Take 1 capsule by mouth daily. 09/13/2020: Taking once daily   [DISCONTINUED] zoledronic acid (RECLAST) 5 MG/100ML SOLN injection Inject 100 mLs (5 mg total) into the vein once.    ALPRAZolam (XANAX) 0.25 MG tablet Take 1-2 tablets (0.25-0.5 mg total) by mouth 3 (three) times daily as needed for anxiety.    busPIRone (BUSPAR) 5 MG tablet Start at 1 tablet twice daily.  May increase to three times daily vs 1.5 tablets twice daily (Patient not taking: Reported on 07/23/2021)    calcium carbonate (TUMS - DOSED IN MG ELEMENTAL CALCIUM) 500 MG chewable tablet Chew 2 tablets by mouth daily. (Patient not taking: Reported on 07/01/2022) 09/19/2021: Not using currently regularly, just prn heartburn   Magnesium 400 MG CAPS Take 400 mg by mouth daily.    MELATONIN PO Take 1 capsule by mouth daily. (Patient not taking: Reported on 07/22/2022) 06/11/2021: 1.5mg    PROAIR HFA 108 (90 Base) MCG/ACT inhaler  (Patient not taking: Reported on 07/01/2022) 09/13/2020: Uses prn, usually just when ill   [DISCONTINUED] ALPRAZolam (XANAX) 0.25 MG tablet Take 1-2 tablets (0.25-0.5 mg total) by mouth 3 (three) times daily as needed for anxiety. (Patient not taking: Reported on 06/11/2021) 06/11/2021: As needed   [DISCONTINUED] levothyroxine (SYNTHROID) 112 MCG tablet 1 tablet Monday through Friday and half tablet on Sundays (Patient taking differently: Take 112 mcg by mouth daily before breakfast. 1 tablet Monday Tuesday Wednesday Friday and Saturday  and half tablet on Thursdays and Sundays)    [DISCONTINUED] omeprazole (PRILOSEC) 40 MG capsule TAKE ONE CAPSULE BY MOUTH DAILY BEFORE DINNER (Patient not taking: Reported on 10/11/2021) 09/19/2021: She has a few left, hasn't been using   [DISCONTINUED] ondansetron (ZOFRAN) 4 MG tablet Take 1 tablet (4 mg total) by mouth every 8 (eight)  hours as needed. 06/11/2021: For post op   No facility-administered encounter medications on file as of 07/22/2022.   NOT taking sertraline prior to today's visit  Allergies  Allergen Reactions   Codeine Phosphate Nausea And Vomiting   Latex Itching    senitivity   ROS: no fever, chills, URI symptoms, weight changes. +anxiety, insomnia per HPI.  No panic attacks, chest pain. +insomnia See HPI   Observations/Objective:  BP 122/83   Ht 5\' 2"  (1.575 m)   Wt 130 lb 9.6 oz (59.2 kg)   LMP 01/04/2005   BMI 23.89 kg/m   Wt Readings from Last 3 Encounters:  07/22/22 130 lb 9.6 oz (59.2 kg)  07/01/22 129 lb (58.5 kg)  04/22/22 130 lb 3.2 oz (59.1 kg)   Pleasant female, somewhat anxious, but overall sounds calm. She is alert and oriented, normal speech, good recall. Exam is limited due to the virtual nature of the visit (and no video of her available).  07/22/2022    3:13 PM 09/19/2021    8:48 AM 10/12/2020    3:27 PM 09/13/2020    9:33 AM 09/13/2019    9:55 AM  Depression screen PHQ 2/9  Decreased Interest 2 0 0 0 0  Down, Depressed, Hopeless 3 0 1 0 1  PHQ - 2 Score 5 0 1 0 1  Altered sleeping 3  1    Tired, decreased energy 3  0    Change in appetite 0  0    Feeling bad or failure about yourself  0  0    Trouble concentrating 2  0    Moving slowly or fidgety/restless 0  0    Suicidal thoughts 0  0    PHQ-9 Score 13  2    Difficult doing work/chores Very difficult          07/22/2022    3:07 PM 07/23/2021   11:00 AM 06/11/2021    3:35 PM 01/10/2021    9:33 AM  GAD 7 : Generalized Anxiety Score  Nervous, Anxious, on Edge 3 1 2 2   Control/stop worrying 3 0 1 3  Worry too much - different things 3 0 2 0  Trouble relaxing 3 0 2 0  Restless 3 0 1 1  Easily annoyed or irritable 3 1 2 1   Afraid - awful might happen 1 0 0 1  Total GAD 7 Score 19 2 10 8   Anxiety Difficulty Very difficult  Somewhat difficult Somewhat difficult    Assessment and Plan:  Insomnia,  unspecified type - related to anxiety, stressors. Worried about sedation.  Start low dose sertraline, titrate up. May use low dose trazodone vs xanax sparingly.  Generalized anxiety disorder - w/ some adjustment d/o. Discussed various treatment options, risks/SE of meds reviewed. Start sertraline 25mg , titrate up if needed. xanax prn, sparingly - Plan: ALPRAZolam (XANAX) 0.25 MG tablet, sertraline (ZOLOFT) 50 MG tablet  Risks/SE of meds reviewed. Prev sedated/lethargic from lexapro. Will start very low, with zoloft, advance as tolerated, if needed.  Sertraline 50--start 1/2 tablet qHS. Increase after 1-2 weeks if no side effects Use alprazolam if needed for any worsening anxiety, especially as can be related to starting the sertraline. Consider buspar in the future. Continue counseling.  F/u as scheduled for AWV at the end of 09/2022. F/u sooner prn  Follow Up Instructions:    I discussed the assessment and treatment plan with the patient. The patient was provided an opportunity to ask questions and all were answered. The patient agreed with the plan and demonstrated an understanding of the instructions.   The patient was advised to call back or seek an in-person evaluation if the symptoms worsen or if the condition fails to improve as anticipated.  I spent 35 minutes dedicated to the care of this patient, including pre-visit review of records, face to face time, post-visit ordering of testing and documentation.    Vikki Ports, MD

## 2022-07-25 DIAGNOSIS — N3941 Urge incontinence: Secondary | ICD-10-CM | POA: Diagnosis not present

## 2022-07-29 ENCOUNTER — Ambulatory Visit (AMBULATORY_SURGERY_CENTER): Payer: Medicare Other | Admitting: Internal Medicine

## 2022-07-29 ENCOUNTER — Encounter: Payer: Self-pay | Admitting: Internal Medicine

## 2022-07-29 VITALS — BP 134/84 | HR 87 | Temp 98.2°F | Resp 16 | Ht 62.0 in | Wt 130.0 lb

## 2022-07-29 DIAGNOSIS — Z1211 Encounter for screening for malignant neoplasm of colon: Secondary | ICD-10-CM | POA: Diagnosis not present

## 2022-07-29 DIAGNOSIS — F411 Generalized anxiety disorder: Secondary | ICD-10-CM | POA: Diagnosis not present

## 2022-07-29 DIAGNOSIS — J45909 Unspecified asthma, uncomplicated: Secondary | ICD-10-CM | POA: Diagnosis not present

## 2022-07-29 DIAGNOSIS — E039 Hypothyroidism, unspecified: Secondary | ICD-10-CM | POA: Diagnosis not present

## 2022-07-29 DIAGNOSIS — Z800 Family history of malignant neoplasm of digestive organs: Secondary | ICD-10-CM | POA: Diagnosis not present

## 2022-07-29 DIAGNOSIS — D120 Benign neoplasm of cecum: Secondary | ICD-10-CM | POA: Diagnosis not present

## 2022-07-29 DIAGNOSIS — F32A Depression, unspecified: Secondary | ICD-10-CM | POA: Diagnosis not present

## 2022-07-29 MED ORDER — SODIUM CHLORIDE 0.9 % IV SOLN: 500.0000 mL | Freq: Once | INTRAVENOUS | Status: DC

## 2022-07-29 NOTE — Progress Notes (Signed)
VS completed by CW   Pt's states no medical or surgical changes since previsit or office visit.  

## 2022-07-29 NOTE — Patient Instructions (Signed)
YOU HAD AN ENDOSCOPIC PROCEDURE TODAY AT [facility]:   Refer to the procedure report that was given to you for any specific questions about what was found during the examination.  If the procedure report does not answer your questions, please call your gastroenterologist to clarify.  If you requested that your care partner not be given the details of your procedure findings, then the procedure report has been included in a sealed envelope for you to review at your convenience later.  YOU SHOULD EXPECT: Some feelings of bloating in the abdomen. Passage of more gas than usual.  Walking can help get rid of the air that was put into your GI tract during the procedure and reduce the bloating. If you had a lower endoscopy (such as a colonoscopy or flexible sigmoidoscopy) you may notice spotting of blood in your stool or on the toilet paper. If you underwent a bowel prep for your procedure, you may not have a normal bowel movement for a few days.  Please Note:  You might notice some irritation and congestion in your nose or some drainage.  This is from the oxygen used during your procedure.  There is no need for concern and it should clear up in a day or so.  SYMPTOMS TO REPORT IMMEDIATELY:  Following lower endoscopy (colonoscopy or flexible sigmoidoscopy):  Excessive amounts of blood in the stool  Significant tenderness or worsening of abdominal pains  Swelling of the abdomen that is new, acute  Fever of 100F or higher   For urgent or emergent issues, a gastroenterologist can be reached at any hour by calling [phone]. Do not use MyChart messaging for urgent concerns.    DIET:  We do recommend a small meal at first, but then you may proceed to your regular diet.  Drink plenty of fluids but you should avoid alcoholic beverages for 24 hours.  MEDICATIONS: Continue present medications.  Please see handouts given to you by your recovery nurse: Polyps.  FOLLOW UP: Await  pathology results from Dr. Lorenso Courier.  Thank you for allowing [place] to provide for your healthcare needs today.  ACTIVITY:  You should plan to take it easy for the rest of today and you should NOT DRIVE or use heavy machinery until tomorrow (because of the sedation medicines used during the test).    FOLLOW UP: Our staff will call the number listed on your records the next business day following your procedure.  We will call around 7:15- 8:00 am to check on you and address any questions or concerns that you may have regarding the information given to you following your procedure. If we do not reach you, we will leave a message.     If any biopsies were taken you will be contacted by phone or by letter within the next 1-3 weeks.  Please call us at [phone] if you have not heard about the biopsies in 3 weeks.    SIGNATURES/CONFIDENTIALITY: You and/or your care partner have signed paperwork which will be entered into your electronic medical record.  These signatures attest to the fact that that the information above on your After Visit Summary has been reviewed and is understood.  Full responsibility of the confidentiality of this discharge information lies with you and/or your care-partner.

## 2022-07-29 NOTE — Progress Notes (Signed)
Uneventful anesthetic. Report to pacu rn. Vss. Care resumed by rn. 

## 2022-07-29 NOTE — Progress Notes (Signed)
GASTROENTEROLOGY PROCEDURE H&P NOTE   Primary Care Physician: Rita Ohara, MD    Reason for Procedure:   Colon cancer screening  Plan:    Colonoscopy  Patient is appropriate for endoscopic procedure(s) in the ambulatory ([facility]) setting.  The nature of the procedure, as well as the risks, benefits, and alternatives were carefully and thoroughly reviewed with the patient. Ample time for discussion and questions allowed. The patient understood, was satisfied, and agreed to proceed.     HPI: Theresa Schneider is a 67 y.o. female who presents for colonoscopy for colon cancer screening. Denies blood in stools, changes in bowel habits, or unintentional weight loss. Grandfather had colon cancer. Last colonoscopy was 10 years ago that was normal.   Past Medical History:  Diagnosis Date   Allergy    SEASONAL/PERENNIAL   Anxiety    Arthritis    Asthma    allergy related   Depression    GAD (generalized anxiety disorder)    Gastritis 05/07/2011   from NSAID's after foot surgery   GERD (gastroesophageal reflux disease)    Hallux rigidus    Hyperthyroidism     hx graves disease. dr Demetrios Isaacs pcp      Hypothyroidism    (since I-131 treatment)   Osteoporosis 05/07/2007   Tendinitis of right ankle 05/06/2005    Past Surgical History:  Procedure Laterality Date   BREAST EXCISIONAL BIOPSY Right 1994   benign    BREAST SURGERY     fibroadenoma, right   COLONOSCOPY     hallux rigidus Right 11/06/2011   keller arthroplasty Left 06/29/2021   for hallux limitus. Dr. Lucious Groves CUFF REPAIR     right   TONSILLECTOMY      Prior to Admission medications   Medication Sig Start Date End Date Taking? Authorizing Provider  ALPRAZolam (XANAX) 0.25 MG tablet Take 1-2 tablets (0.25-0.5 mg total) by mouth 3 (three) times daily as needed for anxiety. 07/22/22  Yes Rita Ohara, MD  azelastine (ASTELIN) 0.1 % nasal spray 1-2 puff in each nostril 05/16/21  Yes [provider]  B  Complex-Folic Acid (B COMPLEX VITAMINS, W/ FA,) CAPS Take by mouth. 10/03/16  Yes [provider]  calcium carbonate (CALCIUM 600) 600 MG TABS tablet Take 600 mg by mouth daily.   Yes [provider]  calcium carbonate (TUMS - DOSED IN MG ELEMENTAL CALCIUM) 500 MG chewable tablet Chew 2 tablets by mouth daily.   Yes [provider]  Cetirizine HCl (ZYRTEC PO) Take 10 mg by mouth at bedtime.   Yes [provider]  cholecalciferol (VITAMIN D) 1000 units tablet Take 1,000 Units by mouth daily. 5000 units in the winter   Yes [provider]  CO ENZYME Q-10 PO Take 100 mg by mouth daily.   Yes [provider]  fluticasone (FLONASE) 50 MCG/ACT nasal spray Place 1 spray into both nostrils daily. 07/17/16  Yes [provider]  levothyroxine (SYNTHROID) 112 MCG tablet Take 112 mcg by mouth daily before breakfast.   Yes [provider]  Magnesium 400 MG CAPS Take 400 mg by mouth daily.   Yes [provider]  montelukast (SINGULAIR) 10 MG tablet Take 10 mg by mouth daily. 09/30/17  Yes [provider]  NON FORMULARY Take 4-6 drops by mouth daily.   Yes [provider]  sertraline (ZOLOFT) 50 MG tablet Take 1/2 tablet by mouth at bedtime. After 1-2 weeks, increase to the full pill, if tolerated  07/22/22  Yes Rita Ohara, MD  TURMERIC PO Take 1 capsule by mouth daily.   Yes [provider]  vitamin C (ASCORBIC ACID) 500 MG tablet Take 500 mg by mouth daily.   Yes [provider]  zinc gluconate 50 MG tablet Take 50 mg by mouth daily.   Yes [provider]  busPIRone (BUSPAR) 5 MG tablet Start at 1 tablet twice daily.  May increase to three times daily vs 1.5 tablets twice daily Patient not taking: Reported on 07/23/2021 06/11/21   Rita Ohara, MD  Denosumab (PROLIA [place]) Inject into the skin every 6 (six) months.    [provider]  MELATONIN PO Take 1 capsule by mouth daily. Patient not  taking: Reported on 07/22/2022    [provider]  PREMARIN vaginal cream 1 applicator. 02/26/17   [provider]  PROAIR HFA 108 [phone] Base) MCG/ACT inhaler  04/20/19   [provider]    Current Outpatient Medications  Medication Sig Dispense Refill   ALPRAZolam (XANAX) 0.25 MG tablet Take 1-2 tablets (0.25-0.5 mg total) by mouth 3 (three) times daily as needed for anxiety. 15 tablet 0   azelastine (ASTELIN) 0.1 % nasal spray 1-2 puff in each nostril     B Complex-Folic Acid (B COMPLEX VITAMINS, W/ FA,) CAPS Take by mouth.     calcium carbonate (CALCIUM 600) 600 MG TABS tablet Take 600 mg by mouth daily.     calcium carbonate (TUMS - DOSED IN MG ELEMENTAL CALCIUM) 500 MG chewable tablet Chew 2 tablets by mouth daily.     Cetirizine HCl (ZYRTEC PO) Take 10 mg by mouth at bedtime.     cholecalciferol (VITAMIN D) 1000 units tablet Take 1,000 Units by mouth daily. 5000 units in the winter     CO ENZYME Q-10 PO Take 100 mg by mouth daily.     fluticasone (FLONASE) 50 MCG/ACT nasal spray Place 1 spray into both nostrils daily.     levothyroxine (SYNTHROID) 112 MCG tablet Take 112 mcg by mouth daily before breakfast.     Magnesium 400 MG CAPS Take 400 mg by mouth daily.     montelukast (SINGULAIR) 10 MG tablet Take 10 mg by mouth daily.  6   NON FORMULARY Take 4-6 drops by mouth daily.     sertraline (ZOLOFT) 50 MG tablet Take 1/2 tablet by mouth at bedtime. After 1-2 weeks, increase to the full pill, if tolerated 30 tablet 2   TURMERIC PO Take 1 capsule by mouth daily.     vitamin C (ASCORBIC ACID) 500 MG tablet Take 500 mg by mouth daily.     zinc gluconate 50 MG tablet Take 50 mg by mouth daily.     busPIRone (BUSPAR) 5 MG tablet Start at 1 tablet twice daily.  May increase to three times daily vs 1.5 tablets twice daily (Patient not taking: Reported on 07/23/2021) 90 tablet 2   Denosumab (PROLIA [place]) Inject into the skin every 6 (six) months.     MELATONIN PO Take 1  capsule by mouth daily. (Patient not taking: Reported on 07/22/2022)     PREMARIN vaginal cream 1 applicator.  3   PROAIR HFA 108 (90 Base) MCG/ACT inhaler  (Patient not taking: Reported on 07/01/2022)     Current Facility-Administered Medications  Medication Dose Route Frequency Provider Last Rate Last Admin   0.9 %  sodium chloride infusion  500 mL Intravenous Once Sharyn Creamer, MD  Allergies as of 07/29/2022 - Review Complete 07/29/2022  Allergen Reaction Noted   Codeine phosphate Nausea And Vomiting 02/19/2011   Latex Itching 10/31/2011    Family History  Problem Relation Age of Onset   Arthritis Mother        OSTEO, vs poss RA   Hypothyroidism Mother    Ulcers Mother        in esophagus   Vision loss Mother        macular hole   Heart attack Father 76   Colon polyps Father        benign   Heart disease Father        MI at 76; stents and pacemaker 3; CHF   Hypothyroidism Father    Diabetes Brother        obese; pre-diabetic   Heart disease Brother 28       MI   Hypothyroidism Sister    Colon polyps Sister    Depression Sister        poss bipolar   Hypothyroidism Sister        Hashimoto's   Deep vein thrombosis Brother    Colon cancer Paternal Grandfather 1   Cancer Maternal Grandfather        esophageal   Esophageal cancer Maternal Grandfather 84   Breast cancer Paternal Aunt    Colon polyps Paternal Aunt    COPD Paternal Aunt    Osteoporosis Paternal Aunt    Osteoporosis Cousin    Osteoporosis Paternal Grandmother    Cancer Maternal Grandmother        leukemia   Deep vein thrombosis Other    Breast cancer Maternal Aunt    Rectal cancer Neg Hx    Stomach cancer Neg Hx     Social History   Socioeconomic History   Marital status: Married    Spouse name: Not on file   Number of children: 3   Years of education: Not on file   Highest education level: Not on file  Occupational History   Occupation: RADIOLOGY Licensed conveyancer: [employer]   Tobacco Use   Smoking status: Never   Smokeless tobacco: Never  Vaping Use   Vaping Use: Never used  Substance and Sexual Activity   Alcohol use: Yes    Alcohol/week: 0.0 standard drinks of alcohol    Comment: a few drinks/year (beach trip with girlfriends, wine at holiday)   Drug use: No   Sexual activity: Not Currently    Partners: Male    Birth control/protection: Post-menopausal    Comment: not currently due to husband's health  Other Topics Concern   Not on file  Social History Narrative   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   Married, husband with throat CA.   3 children, all living in the [place] area, 7 grandchildren   Elderly parents in [place]   Mother-in-law (97) is now in a nursing home.   Social Determinants of Health   Financial Resource Strain: Not on file  Food Insecurity: Not on file  Transportation Needs: Not on file  Physical Activity: Not on file  Stress: Not on file  Social Connections: Not on file  Intimate Partner Violence: Not on file    Physical Exam: Vital signs in last 24 hours: BP (!) 158/100   Pulse (!) 111   Temp 98.2 F (36.8 C) (Temporal)   Ht 5\' 2"  (1.575 m)   Wt 130 lb (59 kg)   LMP 01/04/2005  SpO2 98%   BMI 23.78 kg/m  GEN: NAD EYE: Sclerae anicteric ENT: MMM CV: Non-tachycardic Pulm: No increased work of breathing GI: Soft, NT/ND NEURO:  Alert & Oriented   Christia Reading, MD [facility]  07/29/2022 8:44 AM

## 2022-07-29 NOTE — Telephone Encounter (Signed)
Last Prolia inj 06/25/22 Next Prolia inj due 12/25/22 

## 2022-07-29 NOTE — Op Note (Signed)
[facility] Patient Name: Theresa Schneider Procedure Date: 07/29/2022 8:46 AM MRN: [id number] Endoscopist: Adline Mango [place] , , [id number] Age: 67 Referring MD:  Date of Birth: 1955-10-24 Gender: Female Account #: [id number] Procedure:                Colonoscopy Indications:              Screening for colorectal malignant neoplasm Medicines:                Monitored Anesthesia Care Procedure:                Pre-Anesthesia Assessment:                           - Prior to the procedure, a History and Physical                            was performed, and patient medications and                            allergies were reviewed. The patient's tolerance of                            previous anesthesia was also reviewed. The risks                            and benefits of the procedure and the sedation                            options and risks were discussed with the patient.                            All questions were answered, and informed consent                            was obtained. Prior Anticoagulants: The patient has                            taken no anticoagulant or antiplatelet agents. ASA                            Grade Assessment: II - A patient with mild systemic                            disease. After reviewing the risks and benefits,                            the patient was deemed in satisfactory condition to                            undergo the procedure.                           After obtaining informed consent, the colonoscope  was passed under direct vision. Throughout the                            procedure, the patient's blood pressure, pulse, and                            oxygen saturations were monitored continuously. The                            Olympus CF-HQ190L [id number]) Colonoscope was                            introduced through the anus and advanced to the the                            terminal  ileum. The colonoscopy was performed                            without difficulty. The patient tolerated the                            procedure well. The quality of the bowel                            preparation was excellent. The terminal ileum,                            ileocecal valve, appendiceal orifice, and rectum                            were photographed. Scope In: 8:53:03 AM Scope Out: 9:09:55 AM Scope Withdrawal Time: 0 hours 11 minutes 25 seconds  Total Procedure Duration: 0 hours 16 minutes 52 seconds  Findings:                 The terminal ileum appeared normal.                           A 3 mm polyp was found in the cecum. The polyp was                            sessile. The polyp was removed with a cold snare.                            Resection and retrieval were complete. Complications:            No immediate complications. Estimated Blood Loss:     Estimated blood loss was minimal. Impression:               - The examined portion of the ileum was normal.                           - One 3 mm polyp in the cecum, removed with a cold  snare. Resected and retrieved. Recommendation:           - Discharge patient to home (with escort).                           - Await pathology results.                           - The findings and recommendations were discussed                            with the patient. Dr Georgian Co "Lyndee Leo" Lorenso Courier,  07/29/2022 9:13:18 AM

## 2022-07-30 ENCOUNTER — Telehealth: Payer: Self-pay

## 2022-07-30 NOTE — Telephone Encounter (Signed)
  Follow up Call-     07/29/2022    7:38 AM  Call back number  Post procedure Call Back phone  # [phone]  Permission to leave phone message Yes     Patient questions:  Do you have a fever, pain , or abdominal swelling? No. Pain Score  0 *  Have you tolerated food without any problems? Yes.    Have you been able to return to your normal activities? Yes.    Do you have any questions about your discharge instructions: Diet   No. Medications  No. Follow up visit  No.  Do you have questions or concerns about your Care? No.  Actions: * If pain score is 4 or above: No action needed, pain <4.

## 2022-07-31 ENCOUNTER — Encounter: Payer: Self-pay | Admitting: Internal Medicine

## 2022-07-31 DIAGNOSIS — F439 Reaction to severe stress, unspecified: Secondary | ICD-10-CM | POA: Diagnosis not present

## 2022-07-31 DIAGNOSIS — F4381 Prolonged grief disorder: Secondary | ICD-10-CM | POA: Diagnosis not present

## 2022-07-31 DIAGNOSIS — F4323 Adjustment disorder with mixed anxiety and depressed mood: Secondary | ICD-10-CM | POA: Diagnosis not present

## 2022-08-07 DIAGNOSIS — F439 Reaction to severe stress, unspecified: Secondary | ICD-10-CM | POA: Diagnosis not present

## 2022-08-07 DIAGNOSIS — F4323 Adjustment disorder with mixed anxiety and depressed mood: Secondary | ICD-10-CM | POA: Diagnosis not present

## 2022-08-07 DIAGNOSIS — F4381 Prolonged grief disorder: Secondary | ICD-10-CM | POA: Diagnosis not present

## 2022-08-14 DIAGNOSIS — F4323 Adjustment disorder with mixed anxiety and depressed mood: Secondary | ICD-10-CM | POA: Diagnosis not present

## 2022-08-14 DIAGNOSIS — F4381 Prolonged grief disorder: Secondary | ICD-10-CM | POA: Diagnosis not present

## 2022-08-14 DIAGNOSIS — F439 Reaction to severe stress, unspecified: Secondary | ICD-10-CM | POA: Diagnosis not present

## 2022-08-21 DIAGNOSIS — F4323 Adjustment disorder with mixed anxiety and depressed mood: Secondary | ICD-10-CM | POA: Diagnosis not present

## 2022-08-21 DIAGNOSIS — F4381 Prolonged grief disorder: Secondary | ICD-10-CM | POA: Diagnosis not present

## 2022-08-21 DIAGNOSIS — F439 Reaction to severe stress, unspecified: Secondary | ICD-10-CM | POA: Diagnosis not present

## 2022-08-28 DIAGNOSIS — F439 Reaction to severe stress, unspecified: Secondary | ICD-10-CM | POA: Diagnosis not present

## 2022-08-28 DIAGNOSIS — F4323 Adjustment disorder with mixed anxiety and depressed mood: Secondary | ICD-10-CM | POA: Diagnosis not present

## 2022-08-28 DIAGNOSIS — F4381 Prolonged grief disorder: Secondary | ICD-10-CM | POA: Diagnosis not present

## 2022-09-04 DIAGNOSIS — F4323 Adjustment disorder with mixed anxiety and depressed mood: Secondary | ICD-10-CM | POA: Diagnosis not present

## 2022-09-04 DIAGNOSIS — F439 Reaction to severe stress, unspecified: Secondary | ICD-10-CM | POA: Diagnosis not present

## 2022-09-04 DIAGNOSIS — F4381 Prolonged grief disorder: Secondary | ICD-10-CM | POA: Diagnosis not present

## 2022-09-11 DIAGNOSIS — F4381 Prolonged grief disorder: Secondary | ICD-10-CM | POA: Diagnosis not present

## 2022-09-11 DIAGNOSIS — F439 Reaction to severe stress, unspecified: Secondary | ICD-10-CM | POA: Diagnosis not present

## 2022-09-11 DIAGNOSIS — F4323 Adjustment disorder with mixed anxiety and depressed mood: Secondary | ICD-10-CM | POA: Diagnosis not present

## 2022-09-18 DIAGNOSIS — F4381 Prolonged grief disorder: Secondary | ICD-10-CM | POA: Diagnosis not present

## 2022-09-18 DIAGNOSIS — F439 Reaction to severe stress, unspecified: Secondary | ICD-10-CM | POA: Diagnosis not present

## 2022-09-18 DIAGNOSIS — F4323 Adjustment disorder with mixed anxiety and depressed mood: Secondary | ICD-10-CM | POA: Diagnosis not present

## 2022-09-25 DIAGNOSIS — F4381 Prolonged grief disorder: Secondary | ICD-10-CM | POA: Diagnosis not present

## 2022-09-25 DIAGNOSIS — F439 Reaction to severe stress, unspecified: Secondary | ICD-10-CM | POA: Diagnosis not present

## 2022-09-25 DIAGNOSIS — F4323 Adjustment disorder with mixed anxiety and depressed mood: Secondary | ICD-10-CM | POA: Diagnosis not present

## 2022-09-25 NOTE — Progress Notes (Signed)
Chief Complaint  Patient presents with   Medicare Wellness    Fasting AWV no pelvic, she is not having any issues. Did get flu shot, no covid, RSV or Shingrix. Does not want covid today. She has lab appt 6/13 and 6/18 with Dr Lucianne Muss and he prefers her to do with him. No new concerns.    AMBOR HRNCIR is a 67 y.o. female who presents for Medicare Wellness visit, and follow-up on chronic medical conditions.    Generalized anxiety:   She was last seen in 07/2022 due to stress and exhaustion, not sleeping well. She reported frequent awakenings, sometimes early awakenings (up at 5 and can't get back to sleep.) She needs to get up at 6 to start husband's tube feeding, when he is home. She was asking for sleep medication, but worried about being sedated when he is home, in case he needs something.  She had tried her daughter's trazodone twice, worked better than benadryl no SE or grogginess the next morning.   GAD-7 score was 19 at her March 2024 visit. We started her on low dose sertraline--starting at 25mg , and advised to titrate up in 1-2 wks if needed. We discussed using alprazolam if needed for any worsening anxiety, especially as can be related to starting the sertraline.  She continues to get regular counseling.  Stressors discussed in March included-- Husband's health (frequent hospitalizations related to aspiration pneumonia). Parents moved into [facility] (independent living), weren't acclimating.  They are contracting people at the facilities to do things for them, but things are getting added on, getting expensive. (Ie father needs help with bathing, can't get compression socks on, both parents having incontinence issues.)   Today she reports doing much better. She takes the sertraline at night. She went up to the full pill and noticed increased tiredness during the day.  At the half pill, she sleeps well.  Has some tiredness, but is manageable. She uses xanax sparingly, 1/2 tablet  0-2x/week.  Still has some at home.  Parents got COVID after 2 weeks at [facility], mother just got out of rehab. Husband not hospitalized since March.  Overall she is managing better, and sleeping better.    Cystocele and OAB:  She was getting PTNS for OAB monthly.  Had to stop after 2 years (medicare no longer covered).  She is now back on an oral medication (can't recall the name, myrbetriq was too expensive; she tried samples for a month, had some increased anxiety but not sure if related to the med or not). She has urgency, some leakage, but better if voiding frequently.  (Previously documented in chart that she didn't tolerate Myrbetriq--helped her bladder, but increased anxiety, faster pulse. She got HA's from ditropan and Vesicare as well, as well as dry eyes.) +Stress incontinence with sneezing/laughing, not every time, if bladder is full. Previously used premarin cream 1-2x/week, but admits she hasn't used it in a long time. Denies dysuria, hematuria.  GERD: This is overall improved.  She continues to eat earlier since retiring.  Head of bed is elevated at night. She used to use omeprazole only when laying flat (traveling, babysitting and sleeping in other beds).  Currently she takes Tums every night (felt like she was choking, wasn't sure if it was her anxiety or reflux or PND).  She doesn't wake up with reflux symptoms.  Denies dysphagia.   She has h/o vitamin D deficiency, monitored by Dr. Lucianne Muss.  Last level was 53.2 in 04/2022. She  is currently taking 5000 IU daily.  Osteoporosis--previously treated with Actonel x 2 years in the distant past, also took Prolia (twice, changed due to cost). She got yearly Reclast infusions 10/2013-11/2018 through Dr. Lucianne Muss.  She took 1 year off, and had another dose in 12/2020. Reportedly T was -3.5 prior to Reclast, in 09/2013.  Last DEXA (per Dr. Lucianne Muss) was 09/2021, showing T-2.5 at spine.  (Prior DEXA was 01/2019. T-2.3 spine, -1.8 L fem neck). She was  started back on Prolia, injection 12/2021 and 06/2022. She is tolerating this without side effects.  She is compliant with her Calcium and D She recently started walking some.  No weight-bearing exercise.  H/o Grave's disease, s/p RAI treatment, with resultant hypothyroidism. Under the care of Dr. Lucianne Muss. She denies thyroid symptoms. Medication was switched to generic when she went on medicare.  Last TSH was low at 0.21 in 04/2022.  Her dose was changed from 112 mcg 6.5 tablets/week to 1 tablet 6 days/week (dose decreased by 1/2 tablet). She has not had TSH rechecked but is scheduled for visits in June (lab and OV). B-met also in future orders (likely to be done prior to Prolia, at a separate time, no expected date in the order).  Lab Results  Component Value Date   TSH 0.21 (L) 04/08/2022   Allergies:  She has been off immunotherapy, under the care of Dr. [place] Callas.  Denies worsening of allergies. She is on Flonase; admits she hasn't been as regular with her montelukast. She uses Zyrtec daily. She has some mild chronic congestion.  She denies sinus headaches.  Hyperlipidemia:  Diet has changed since her husband got a feeding tube, not cooking much.  She doesn't eat red meat often.  Eggs about 5/week on average. +cheese (less than in the past). Lowfat yogurt; sometimes regular milk (only has this if in the house for grandkids), some oatmilk and almondmilk. +mayo on tuna ,not eating this regularly/ Due for recheck.  Lab Results  Component Value Date   CHOL 234 (H) 09/19/2021   HDL 76 09/19/2021   LDLCALC 144 (H) 09/19/2021   TRIG 80 09/19/2021   CHOLHDL 3.1 09/19/2021   Immunization History  Administered Date(s) Administered   DTaP 10/05/2006   Influenza, High Dose Seasonal PF 02/19/2017, 02/08/2019   Influenza,inj,Quad PF,6+ Mos 03/10/2018, 02/08/2022   Influenza-Unspecified 02/19/2017   Moderna Sars-Covid-2 Vaccination 05/18/2019, 06/21/2019, 01/21/2020   PNEUMOCOCCAL CONJUGATE-20  09/13/2020   Pneumococcal Polysaccharide-23 09/13/2019   Tdap 11/21/2013    Last Pap smear: 09/2021, normal with no high risk HPV Last mammogram: 12/2021 Last colonoscopy:  07/2022 Dr. Leonides Schanz, tubular adenoma. Recheck 7 years Last DEXA:  09/2021 T-2.5 at spine Dentist: twice yearly Ophtho: yearly, wears glasses. Exercise:  Walking some, sporadically (got off track). No weight-bearing exercise.   Patient Care Team: Joselyn Arrow, MD as PCP - General (Family Medicine) Reather Littler, MD as Attending Physician (Endocrinology) Sidney Ace, MD (Allergy) Elinor Parkinson, [place] as Consulting Physician (Podiatry) Jethro Bolus, MD as Consulting Physician (Ophthalmology) Tiffeny Paci, MD as Consulting Physician (Urology) Donzetta Starch, MD as Consulting Physician (Dermatology) Salvatore Marvel, MD as Consulting Physician (Orthopedic Surgery) Dentist: Dr. Genia Del Ortho: Dr. Thurston Hole (not seen in a while) GI: Dr. Leonides Schanz Holistic med: Dr. Royal Piedra (in [place])--not in a while Counselor:  Carney Bern  Depression Screening: Flowsheet Row Office Visit from 09/26/2022 in [facility]  PHQ-2 Total Score 1       Falls screen:     09/26/2022  8:33 AM 07/22/2022    3:13 PM 09/19/2021    8:47 AM 09/13/2020    9:32 AM 09/13/2019    9:55 AM  Fall Risk   Falls in the past year? 1 0 0 0 0  Comment fell while watering grass-3/23      Number falls in past yr: 0 0 0 0 0  Injury with Fall? 0 0 0 0 0  Risk for fall due to : No Fall Risks No Fall Risks No Fall Risks No Fall Risks   Follow up Falls evaluation completed Falls evaluation completed Falls evaluation completed Falls evaluation completed      Functional Status Survey: Is the patient deaf or have difficulty hearing?: No Does the patient have difficulty seeing, even when wearing glasses/contacts?: Yes (one of her eyes moves, sees Dr Dawna Part) Does the patient have difficulty concentrating, remembering, or making decisions?: Yes  (makes notes for memory, just has a lot going on) Does the patient have difficulty walking or climbing stairs?: No Does the patient have difficulty dressing or bathing?: No Does the patient have difficulty doing errands alone such as visiting a doctor's office or shopping?: No  Mini-Cog Scoring: 5   End of Life Discussion:  Patient does not have a living will and medical power of attorney. Given paperwork in the past, hasn't returned.    PMH, PSH, SH and FH were reviewed and updated.  Outpatient Encounter Medications as of 09/26/2022  Medication Sig Note   azelastine (ASTELIN) 0.1 % nasal spray 1-2 puff in each nostril 09/19/2021: In the am, if needed   B Complex-Folic Acid (B COMPLEX VITAMINS, W/ FA,) CAPS Take by mouth.    calcium carbonate (CALCIUM 600) 600 MG TABS tablet Take 600 mg by mouth daily.    calcium carbonate (TUMS - DOSED IN MG ELEMENTAL CALCIUM) 500 MG chewable tablet Chew 2 tablets by mouth daily. 09/26/2022: Takes 1 every night   Cetirizine HCl (ZYRTEC PO) Take 10 mg by mouth at bedtime.    cholecalciferol (VITAMIN D) 1000 units tablet Take 1,000 Units by mouth daily. 5000 units in the winter 09/26/2022: 5000iu currently   CO ENZYME Q-10 PO Take 100 mg by mouth daily.    Denosumab (PROLIA [place]) Inject into the skin every 6 (six) months.    fluticasone (FLONASE) 50 MCG/ACT nasal spray Place 1 spray into both nostrils daily. 07/23/2021: nightly   levothyroxine (SYNTHROID) 112 MCG tablet Take 112 mcg by mouth daily before breakfast. 09/26/2022: 112 daily except takes half tab on Th and Sun     Magnesium 400 MG CAPS Take 400 mg by mouth daily.    montelukast (SINGULAIR) 10 MG tablet Take 10 mg by mouth daily. 09/26/2022: Doesn't always take every day   sertraline (ZOLOFT) 50 MG tablet Take 1/2 tablet by mouth at bedtime. After 1-2 weeks, increase to the full pill, if tolerated 09/26/2022: Taking 1/2 daily   TURMERIC PO Take 1 capsule by mouth daily.    vitamin C (ASCORBIC ACID) 500  MG tablet Take 500 mg by mouth daily.    zinc gluconate 50 MG tablet Take 50 mg by mouth daily.    ALPRAZolam (XANAX) 0.25 MG tablet Take 1-2 tablets (0.25-0.5 mg total) by mouth 3 (three) times daily as needed for anxiety. (Patient not taking: Reported on 09/26/2022) 09/26/2022: As needed, takes 1/2 tab   NON FORMULARY Take 4-6 drops by mouth daily. (Patient not taking: Reported on 09/26/2022) 09/26/2022: CBD oil sporadically    PREMARIN vaginal  cream 1 applicator. (Patient not taking: Reported on 09/26/2022) 09/26/2022: prn   PROAIR HFA 108 (90 Base) MCG/ACT inhaler  (Patient not taking: Reported on 07/01/2022) 09/26/2022: Uses prn, usually just when ill    [DISCONTINUED] busPIRone (BUSPAR) 5 MG tablet Start at 1 tablet twice daily.  May increase to three times daily vs 1.5 tablets twice daily (Patient not taking: Reported on 07/23/2021)    [DISCONTINUED] MELATONIN PO Take 1 capsule by mouth daily. (Patient not taking: Reported on 07/22/2022) 06/11/2021: 1.5mg    Facility-Administered Encounter Medications as of 09/26/2022  Medication   0.9 %  sodium chloride infusion   Allergies  Allergen Reactions   Codeine Phosphate Nausea And Vomiting   Latex Itching    senitivity    ROS:  The patient denies anorexia, fever, significant weight changes, vision changes, decreased hearing, ear pain, sore throat, breast concerns, chest pain, palpitations, dizziness, syncope, dyspnea on exertion, cough, swelling, nausea, vomiting, diarrhea, constipation, abdominal pain, melena, hematochezia, hematuria, dysuria, vaginal bleeding, discharge, odor or itch, genital lesions, numbness, tingling, weakness, tremor, suspicious skin lesions, depression, abnormal bleeding/bruising, or enlarged lymph nodes. Anxiety and insomnia has improved. Occasional HA's (less often, grinds her teeth)  Denies sinus headaches.  Vaginal dryness. No hot flashes or night sweats. Some residual L foot pain, s/p surgery (mild discomfort and swelling on  the top of the foot). Chronic congestion/allergies, overall controlled. No significant heartburn. Denies dysphagia Leakage of urine with sneeze/laughing. Some urinary urgency.  Up 1-2x/night to void. Pain at base of L thumb   PHYSICAL EXAM:  BP 120/70   Pulse 64   Ht 5\' 2"  (1.575 m)   Wt 132 lb 6.4 oz (60.1 kg)   LMP 01/04/2005   BMI 24.22 kg/m   Wt Readings from Last 3 Encounters:  09/26/22 132 lb 6.4 oz (60.1 kg)  07/29/22 130 lb (59 kg)  07/22/22 130 lb 9.6 oz (59.2 kg)    General Appearance:    Alert, cooperative, talkative female in no distress, appears stated age  Head:    Normocephalic, without obvious abnormality, atraumatic  Eyes:    PERRL, conjunctiva/corneas clear, EOM's intact, fundi benign  Ears:    Normal TM's and external ear canals  Nose:   No drainage or sinus tenderness.  Throat:   normal mucosa, no lesions  Neck:   Supple, no lymphadenopathy;  thyroid:  no enlargement/ tenderness/nodules; no carotid bruit or JVD  Back:    Spine nontender, no curvature, ROM normal, no CVA tenderness  Lungs:     Clear to auscultation bilaterally without wheezes, rales or ronchi; respirations unlabored  Chest Wall:    No tenderness or deformity.    Heart:    Regular rate and rhythm, S1 and S2 normal, no murmur, rub or gallop  Breast Exam:   No nipple discharge or inversion.  No skin dimpling breast masses or tenderness. No axillary lymphadenopathy  Abdomen:     Soft, non-tender, nondistended, normoactive bowel sounds,    no masses, no hepatosplenomegaly  Genitalia:    Exam not performed  Rectal:    Exam not performed  Extremities:   No clubbing, cyanosis or edema. WHSS L foot.   Pulses:   2+ and symmetric all extremities  Skin:   Skin color, texture, turgor normal, no rashes.  Lymph nodes:   Cervical, supraclavicular, axillary and inguinal nodes normal  Neurologic:   Normal strength, sensation and gait; reflexes 2+ and symmetric throughout  Psych:   Normal mood, affect, hygiene and grooming.       09/26/2022    8:35 AM 07/22/2022    3:07 PM 07/23/2021   11:00 AM 06/11/2021    3:35 PM  GAD 7 : Generalized Anxiety Score  Nervous, Anxious, on Edge 1 3 1 2   Control/stop worrying 0 3 0 1  Worry too much - different things 0 3 0 2  Trouble relaxing 3 3 0 2  Restless 3 3 0 1  Easily annoyed or irritable 0 3 1 2   Afraid - awful might happen 0 1 0 0  Total GAD 7 Score 7 19 2 10   Anxiety Difficulty Not difficult at all Very difficult  Somewhat difficult       09/26/2022    8:33 AM 07/22/2022    3:13 PM 09/19/2021    8:48 AM 10/12/2020    3:27 PM 09/13/2020    9:33 AM  Depression screen PHQ 2/9  Decreased Interest 1 2 0 0 0  Down, Depressed, Hopeless 0 3 0 1 0  PHQ - 2 Score 1 5 0 1 0  Altered sleeping 0 3  1   Tired, decreased energy 3 3  0   Change in appetite 2 0  0   Feeling bad or failure about yourself  0 0  0   Trouble concentrating 0 2  0   Moving slowly or fidgety/restless 0 0  0   Suicidal thoughts 0 0  0   PHQ-9 Score 6 13  2    Difficult doing work/chores Somewhat difficult Very difficult       ASSESSMENT/PLAN:  Medicare annual wellness visit, subsequent  Generalized anxiety disorder Assessment & Plan: Improved on Sertraline 25mg .  Too tired with higher dose.  If worsening anxiety, and if can't tolerate 50mg , consider switching to less sedating SSRI (ie lexapro or celexa).  Currently managing well on 25mg  sertraline. Cont counseling  Orders: -     Sertraline HCl; Take 1 tablet by mouth every eveninhg  Dispense: 90 tablet; Refill: 1  Other osteoporosis, unspecified pathological fracture presence Assessment & Plan: Currently getting Prolia injections through Dr. Remus Blake office.  Discussed Calcium (she may be getting too much with daily supplement plus daily Tums, plus dietary sources, discussed), vitamin D, and the need to get weight-bearing exercise at least 2x/week   Generalized anxiety  disorder Assessment & Plan: Improved on Sertraline 25mg .  Too tired with higher dose.  If worsening anxiety, and if can't tolerate 50mg , consider switching to less sedating SSRI (ie lexapro or celexa).  Currently managing well on 25mg  sertraline. Cont counseling  Orders: -     Sertraline HCl; Take 1 tablet by mouth every eveninhg  Dispense: 90 tablet; Refill: 1  Pure hypercholesterolemia Assessment & Plan: Reviewed low cholesterol diet.  Thyroid may affect results, not being rechecked until June.  Orders: -     Lipid panel  Postablative hypothyroidism Assessment & Plan: Under care of Dr. Lucianne Muss. Had dose decreased slightly at last visit (TSH was low), scheduled for f/u labs next month   OAB (overactive bladder) Assessment & Plan: Had to stop PTNS d/t insurance reasons.  Likely on anticholinergic med (couldn't recall med).   Managed by urologist.  Discussed Myrbetriq is safest. Not affordable; she wonders if SE she previously reported were more due to her anxiety, but can't afford med anyway (to retry).   Medication monitoring encounter -     CBC with Differential/Platelet -  CMP14+EGFR  Stress due to illness of family member Assessment & Plan: Stressors include her husband's cancer/health.  Also related to caring for her elderly parents.  They moved locally to independent living, have health issues (mother just got out of rehab after hospitalization for COVID). Mother-in-law passed away. Currently managing stress okay.        Declined getting thyroid tests checked with today's labs, has f/u with Dr. Lucianne Muss in June with labs prior.  Discussed monthly self breast exams and yearly mammograms; at least 30 minutes of aerobic activity at least 5 days/week, weight-bearing exercise at least 2x/week; proper sunscreen use reviewed; healthy diet, including goals of calcium and vitamin D intake and alcohol recommendations (less than or equal to 1 drink/day) reviewed; regular seatbelt use;  changing batteries in smoke detectors, carbon monoxide detectors.  Immunization recommendations discussed--yearly flu shots recommended.  Shingrix recommended, to get from pharmacy. Risks/SE reviewed. Bivalent COVID booster recommended, declined. RSV vaccine recommended, to get in the Fall (along with her flu shot). Colonoscopy recommendations reviewed, UTD, due 07/3029 (7 year f/u)  MOST form reviewed/signed, full code, full care Reminded to get [place] copies of Living Will and healthcare POA.  F/u 6 mos, med check (anxiety).   Continue the 25 mg of sertraline, since you are doing much better, and the higher dose was too sedating during the day. If your moods worsen, you can either re-try the 50 mg dose and see if the tiredness improves, vs we can switch to a less sedating SSRI (ie lexapro, celexa). Please work on getting some weight-bearing exercise as we discussed (either at home, or through [employer] at [employer], whichever works best for you). Look at your calcium intake--now that you are taking Tums daily, be sure that you aren't getting too much between your diet and your other supplements.     Medicare Attestation I have personally reviewed: The patient's medical and social history Their use of alcohol, tobacco or illicit drugs Their current medications and supplements The patient's functional ability including ADLs,fall risks, home safety risks, cognitive, and hearing and visual impairment Diet and physical activities Evidence for depression or mood disorders  The patient's weight, height, BMI have been recorded in the chart.  I have made referrals, counseling, and provided education to the patient based on review of the above and I have provided the patient with a written personalized care plan for preventive services.

## 2022-09-25 NOTE — Patient Instructions (Incomplete)
HEALTH MAINTENANCE RECOMMENDATIONS:  It is recommended that you get at least 30 minutes of aerobic exercise at least 5 days/week (for weight loss, you may need as much as 60-90 minutes). This can be any activity that gets your heart rate up. This can be divided in 10-15 minute intervals if needed, but try and build up your endurance at least once a week.  Weight bearing exercise is also recommended twice weekly.  Eat a healthy diet with lots of vegetables, fruits and fiber.  "Colorful" foods have a lot of vitamins (ie green vegetables, tomatoes, red peppers, etc).  Limit sweet tea, regular sodas and alcoholic beverages, all of which has a lot of calories and sugar.  Up to 1 alcoholic drink daily may be beneficial for women (unless trying to lose weight, watch sugars).  Drink a lot of water.  Calcium recommendations are 1200-1500 mg daily (1500 mg for postmenopausal women or women without ovaries), and vitamin D 1000 IU daily.  This should be obtained from diet and/or supplements (vitamins), and calcium should not be taken all at once, but in divided doses.  Monthly self breast exams and yearly mammograms for women over the age of 36 is recommended.  Sunscreen of at least SPF 30 should be used on all sun-exposed parts of the skin when outside between the hours of 10 am and 4 pm (not just when at beach or pool, but even with exercise, golf, tennis, and yard work!)  Use a sunscreen that says "broad spectrum" so it covers both UVA and UVB rays, and make sure to reapply every 1-2 hours.  Remember to change the batteries in your smoke detectors when changing your clock times in the spring and fall. Carbon monoxide detectors are recommended for your home.  Use your seat belt every time you are in a car, and please drive safely and not be distracted with cell phones and texting while driving.   Theresa Schneider , Thank you for taking time to come for your Medicare Wellness Visit. I appreciate your ongoing  commitment to your health goals. Please review the following plan we discussed and let me know if I can assist you in the future.   This is a list of the screening recommended for you and due dates:  Health Maintenance  Topic Date Due   Zoster (Shingles) Vaccine (1 of 2) Never done   COVID-19 Vaccine (4 - 2023-24 season) 01/04/2022   Flu Shot  12/05/2022   Mammogram  12/08/2022   Medicare Annual Wellness Visit  09/26/2023   DTaP/Tdap/Td vaccine (3 - Td or Tdap) 11/22/2023   Colon Cancer Screening  07/28/2029   Pneumonia Vaccine  Completed   DEXA scan (bone density measurement)  Completed   Hepatitis C Screening: USPSTF Recommendation to screen - Ages 57-79 yo.  Completed   HPV Vaccine  Aged Out   I recommend getting the new shingles vaccine (Shingrix). Since you have Medicare, you will need to get this from the pharmacy, as it is covered by Part D. This is a series of 2 injections, spaced 2 months apart.   This should be separated from other vaccines by at least 2 weeks.  RSV vaccine is recommended in the Fall, along with your flu shot. You need to get the RSV vaccine from the pharmacy.  COVID booster is recommended.  Please bring [place] copies of your Living Will and Healthcare Power of Attorney once completed and notarized so that it can be scanned into your medical  chart.

## 2022-09-26 ENCOUNTER — Ambulatory Visit (INDEPENDENT_AMBULATORY_CARE_PROVIDER_SITE_OTHER): Payer: Medicare Other | Admitting: Family Medicine

## 2022-09-26 ENCOUNTER — Encounter: Payer: Self-pay | Admitting: Family Medicine

## 2022-09-26 VITALS — BP 120/70 | HR 64 | Ht 62.0 in | Wt 132.4 lb

## 2022-09-26 DIAGNOSIS — Z6379 Other stressful life events affecting family and household: Secondary | ICD-10-CM

## 2022-09-26 DIAGNOSIS — F411 Generalized anxiety disorder: Secondary | ICD-10-CM | POA: Diagnosis not present

## 2022-09-26 DIAGNOSIS — M818 Other osteoporosis without current pathological fracture: Secondary | ICD-10-CM | POA: Diagnosis not present

## 2022-09-26 DIAGNOSIS — E7800 Pure hypercholesterolemia, unspecified: Secondary | ICD-10-CM | POA: Diagnosis not present

## 2022-09-26 DIAGNOSIS — H1045 Other chronic allergic conjunctivitis: Secondary | ICD-10-CM

## 2022-09-26 DIAGNOSIS — Z0000 Encounter for general adult medical examination without abnormal findings: Secondary | ICD-10-CM | POA: Diagnosis not present

## 2022-09-26 DIAGNOSIS — E890 Postprocedural hypothyroidism: Secondary | ICD-10-CM

## 2022-09-26 DIAGNOSIS — Z5181 Encounter for therapeutic drug level monitoring: Secondary | ICD-10-CM

## 2022-09-26 DIAGNOSIS — N3281 Overactive bladder: Secondary | ICD-10-CM

## 2022-09-26 MED ORDER — SERTRALINE HCL 25 MG PO TABS
ORAL_TABLET | ORAL | 1 refills | Status: DC
Start: 2022-09-26 — End: 2022-12-27

## 2022-09-27 LAB — CMP14+EGFR
ALT: 15 IU/L (ref 0–32)
AST: 23 IU/L (ref 0–40)
Albumin/Globulin Ratio: 1.9 (ref 1.2–2.2)
Albumin: 4.6 g/dL (ref 3.9–4.9)
Alkaline Phosphatase: 69 IU/L (ref 44–121)
BUN/Creatinine Ratio: 13 (ref 12–28)
BUN: 8 mg/dL (ref 8–27)
Bilirubin Total: 0.4 mg/dL (ref 0.0–1.2)
CO2: 24 mmol/L (ref 20–29)
Calcium: 9.3 mg/dL (ref 8.7–10.3)
Chloride: 103 mmol/L (ref 96–106)
Creatinine, Ser: 0.64 mg/dL (ref 0.57–1.00)
Globulin, Total: 2.4 g/dL (ref 1.5–4.5)
Glucose: 79 mg/dL (ref 70–99)
Potassium: 4.6 mmol/L (ref 3.5–5.2)
Sodium: 141 mmol/L (ref 134–144)
Total Protein: 7 g/dL (ref 6.0–8.5)
eGFR: 96 mL/min/{1.73_m2} (ref >59)

## 2022-09-27 LAB — LIPID PANEL
Chol/HDL Ratio: 3.2 ratio (ref 0.0–4.4)
Cholesterol, Total: 253 mg/dL — ABNORMAL HIGH (ref 100–199)
HDL: 78 mg/dL (ref >39)
LDL Chol Calc (NIH): 155 mg/dL — ABNORMAL HIGH (ref 0–99)
Triglycerides: 113 mg/dL (ref 0–149)
VLDL Cholesterol Cal: 20 mg/dL (ref 5–40)

## 2022-09-27 LAB — CBC WITH DIFFERENTIAL/PLATELET
Basophils Absolute: 0.1 10*3/uL (ref 0.0–0.2)
Basos: 1 %
EOS (ABSOLUTE): 0.1 10*3/uL (ref 0.0–0.4)
Eos: 1 %
Hematocrit: 40.4 % (ref 34.0–46.6)
Hemoglobin: 13.3 g/dL (ref 11.1–15.9)
Immature Grans (Abs): 0 10*3/uL (ref 0.0–0.1)
Immature Granulocytes: 0 %
Lymphocytes Absolute: 2.3 10*3/uL (ref 0.7–3.1)
Lymphs: 26 %
MCH: 29 pg (ref 26.6–33.0)
MCHC: 32.9 g/dL (ref 31.5–35.7)
MCV: 88 fL (ref 79–97)
Monocytes Absolute: 0.9 10*3/uL (ref 0.1–0.9)
Monocytes: 10 %
Neutrophils Absolute: 5.3 10*3/uL (ref 1.4–7.0)
Neutrophils: 62 %
Platelets: 423 10*3/uL (ref 150–450)
RBC: 4.58 x10E6/uL (ref 3.77–5.28)
RDW: 13.1 % (ref 11.7–15.4)
WBC: 8.7 10*3/uL (ref 3.4–10.8)

## 2022-09-27 NOTE — Assessment & Plan Note (Signed)
Currently getting Prolia injections through Dr. Remus Blake office.  Discussed Calcium (she may be getting too much with daily supplement plus daily Tums, plus dietary sources, discussed), vitamin D, and the need to get weight-bearing exercise at least 2x/week

## 2022-09-27 NOTE — Assessment & Plan Note (Signed)
Under care of Dr. Lucianne Muss. Had dose decreased slightly at last visit (TSH was low), scheduled for f/u labs next month

## 2022-09-27 NOTE — Assessment & Plan Note (Signed)
Improved on Sertraline 25mg .  Too tired with higher dose.  If worsening anxiety, and if can't tolerate 50mg , consider switching to less sedating SSRI (ie lexapro or celexa).  Currently managing well on 25mg  sertraline. Cont counseling

## 2022-09-27 NOTE — Assessment & Plan Note (Signed)
Reviewed low cholesterol diet.  Thyroid may affect results, not being rechecked until June.

## 2022-09-27 NOTE — Assessment & Plan Note (Signed)
Stressors include her husband's cancer/health.  Also related to caring for her elderly parents.  They moved locally to independent living, have health issues (mother just got out of rehab after hospitalization for COVID). Mother-in-law passed away. Currently managing stress okay.

## 2022-09-27 NOTE — Assessment & Plan Note (Signed)
Had to stop PTNS d/t insurance reasons.  Likely on anticholinergic med (couldn't recall med).   Managed by urologist.  Discussed Myrbetriq is safest. Not affordable; she wonders if SE she previously reported were more due to her anxiety, but can't afford med anyway (to retry).

## 2022-10-02 DIAGNOSIS — F4323 Adjustment disorder with mixed anxiety and depressed mood: Secondary | ICD-10-CM | POA: Diagnosis not present

## 2022-10-02 DIAGNOSIS — F4381 Prolonged grief disorder: Secondary | ICD-10-CM | POA: Diagnosis not present

## 2022-10-02 DIAGNOSIS — F439 Reaction to severe stress, unspecified: Secondary | ICD-10-CM | POA: Diagnosis not present

## 2022-10-08 DIAGNOSIS — F4381 Prolonged grief disorder: Secondary | ICD-10-CM | POA: Diagnosis not present

## 2022-10-08 DIAGNOSIS — F439 Reaction to severe stress, unspecified: Secondary | ICD-10-CM | POA: Diagnosis not present

## 2022-10-08 DIAGNOSIS — F4323 Adjustment disorder with mixed anxiety and depressed mood: Secondary | ICD-10-CM | POA: Diagnosis not present

## 2022-10-15 DIAGNOSIS — F4381 Prolonged grief disorder: Secondary | ICD-10-CM | POA: Diagnosis not present

## 2022-10-15 DIAGNOSIS — F4323 Adjustment disorder with mixed anxiety and depressed mood: Secondary | ICD-10-CM | POA: Diagnosis not present

## 2022-10-15 DIAGNOSIS — F439 Reaction to severe stress, unspecified: Secondary | ICD-10-CM | POA: Diagnosis not present

## 2022-10-17 ENCOUNTER — Other Ambulatory Visit (INDEPENDENT_AMBULATORY_CARE_PROVIDER_SITE_OTHER): Payer: Medicare Other

## 2022-10-17 DIAGNOSIS — H524 Presbyopia: Secondary | ICD-10-CM | POA: Diagnosis not present

## 2022-10-17 DIAGNOSIS — M810 Age-related osteoporosis without current pathological fracture: Secondary | ICD-10-CM

## 2022-10-17 DIAGNOSIS — H5203 Hypermetropia, bilateral: Secondary | ICD-10-CM | POA: Diagnosis not present

## 2022-10-17 DIAGNOSIS — E890 Postprocedural hypothyroidism: Secondary | ICD-10-CM

## 2022-10-17 DIAGNOSIS — H2513 Age-related nuclear cataract, bilateral: Secondary | ICD-10-CM | POA: Diagnosis not present

## 2022-10-17 DIAGNOSIS — H52203 Unspecified astigmatism, bilateral: Secondary | ICD-10-CM | POA: Diagnosis not present

## 2022-10-17 LAB — BASIC METABOLIC PANEL
BUN: 12 mg/dL (ref 6–23)
CO2: 28 mEq/L (ref 19–32)
Calcium: 9.7 mg/dL (ref 8.4–10.5)
Chloride: 102 mEq/L (ref 96–112)
Creatinine, Ser: 0.69 mg/dL (ref 0.40–1.20)
GFR: 88.83 mL/min (ref >60.00)
Glucose, Bld: 84 mg/dL (ref 70–99)
Potassium: 4.5 mEq/L (ref 3.5–5.1)
Sodium: 138 mEq/L (ref 135–145)

## 2022-10-17 LAB — TSH: TSH: 0.16 u[IU]/mL — ABNORMAL LOW (ref 0.35–5.50)

## 2022-10-17 LAB — T4, FREE: Free T4: 1.19 ng/dL (ref 0.60–1.60)

## 2022-10-22 ENCOUNTER — Ambulatory Visit (INDEPENDENT_AMBULATORY_CARE_PROVIDER_SITE_OTHER): Payer: Medicare Other | Admitting: Endocrinology

## 2022-10-22 VITALS — BP 120/78 | HR 69 | Ht 62.0 in | Wt 133.0 lb

## 2022-10-22 DIAGNOSIS — E890 Postprocedural hypothyroidism: Secondary | ICD-10-CM | POA: Diagnosis not present

## 2022-10-22 DIAGNOSIS — M810 Age-related osteoporosis without current pathological fracture: Secondary | ICD-10-CM | POA: Diagnosis not present

## 2022-10-22 DIAGNOSIS — F4381 Prolonged grief disorder: Secondary | ICD-10-CM | POA: Diagnosis not present

## 2022-10-22 DIAGNOSIS — F4323 Adjustment disorder with mixed anxiety and depressed mood: Secondary | ICD-10-CM | POA: Diagnosis not present

## 2022-10-22 DIAGNOSIS — F439 Reaction to severe stress, unspecified: Secondary | ICD-10-CM | POA: Diagnosis not present

## 2022-10-22 MED ORDER — LEVOTHYROXINE SODIUM 88 MCG PO TABS: 88.0000 ug | ORAL_TABLET | Freq: Every day | ORAL | 3 refills | Status: DC

## 2022-10-22 NOTE — Patient Instructions (Signed)
Start new dose of levothyroxine TSH level on 8/21

## 2022-10-22 NOTE — Progress Notes (Unsigned)
Patient ID: Theresa Schneider, female   DOB: [birth year], 67 y.o.   MRN: [id number]    Reason for Appointment: Follow-up of thyroid and of osteoporosis   History of Present Illness:   Osteoporosis history: She has previously been evaluated and treated by her gynecologist since about 2004. She was given a trial of Actonel about 10 years ago. She  had difficulty tolerating this because of abdominal discomfort and not clear if she took this regularly, probably not over 2 years  She was subsequently given Prolia starting in about 2012 and she got only 3 injections. The injection was stopped because of lack of insurance coverage.  Was started on Reclast in 10/2013 when her bone density done by her gynecologist on 09/21/13 showed a T score of -3.5 at the lumbar spine.  Subsequently she has had a total of 6 Reclast infusions  Last infusion was 01/01/2021 with a gap of 2 years  Since her bone density was showing slight decrease at the spine in 2023 she is now taking PROLIA with the first injection on 12/19/2021 No side effects with the Prolia, she has had 2 injections with the last one on 06/25/2022  No unexpected back pain or fracture   Results from bone density of 09/18/2021 with comparisons as below  T-score AP Spine  L1-L4      09/18/2021    67.8         -2.5    0.886 g/cm2 AP Spine  L1-L4      01/14/2019    65.2         -2.3    0.904 g/cm2   DualFemur Neck Left  09/18/2021    67.8         -2.0    0.766 g/cm2 DualFemur Neck Left  01/14/2019    65.2         -1.8    0.781 g/cm2   DualFemur Total Mean 09/18/2021    67.8         -1.4    0.830 g/cm2 DualFemur Total Mean 01/14/2019    65.2         -1.5    0.815 g/cm2  VITAMIN D.: She has had normal levels on supplementation  Does not like to drink milk, is on Calcium tablets She is taking vitamin D  1000 units a day,   Lab Results  Component Value Date   VD25OH 53.20 04/08/2022    HYPOTHYROIDISM: Discussed in review of  symptoms   Allergies as of 10/22/2022       Reactions   Codeine Phosphate Nausea And Vomiting   Latex Itching   senitivity        Medication List        Accurate as of October 22, 2022 10:51 AM. If you have any questions, ask your nurse or doctor.          ALPRAZolam 0.25 MG tablet Commonly known as: XANAX Take 1-2 tablets (0.25-0.5 mg total) by mouth 3 (three) times daily as needed for anxiety.   ascorbic acid 500 MG tablet Commonly known as: VITAMIN C Take 500 mg by mouth daily.   azelastine 0.1 % nasal spray Commonly known as: ASTELIN 1-2 puff in each nostril   B Complex Vitamins (w/ FA) Caps Take by mouth.   Calcium 600 600 MG Tabs tablet Generic drug: calcium carbonate Take 600 mg by mouth daily.   calcium carbonate 500 MG chewable tablet Commonly known as: TUMS - dosed  in mg elemental calcium Chew 2 tablets by mouth daily.   cholecalciferol 1000 units tablet Commonly known as: VITAMIN D Take 1,000 Units by mouth daily. 5000 units in the winter   CO ENZYME Q-10 PO Take 100 mg by mouth daily.   fluticasone 50 MCG/ACT nasal spray Commonly known as: FLONASE Place 1 spray into both nostrils daily.   levothyroxine 112 MCG tablet Commonly known as: SYNTHROID Take 112 mcg by mouth daily before breakfast.   Magnesium 400 MG Caps Take 400 mg by mouth daily.   montelukast 10 MG tablet Commonly known as: SINGULAIR Take 10 mg by mouth daily.   NON FORMULARY Take 4-6 drops by mouth daily.   oxybutynin 5 MG 24 hr tablet Commonly known as: DITROPAN-XL Take 5 mg by mouth daily.   Premarin vaginal cream Generic drug: conjugated estrogens 1 applicator.   ProAir HFA 108 (90 Base) MCG/ACT inhaler Generic drug: albuterol   PROLIA [place] Inject into the skin every 6 (six) months.   sertraline 25 MG tablet Commonly known as: ZOLOFT Take 1 tablet by mouth every eveninhg   TURMERIC PO Take 1 capsule by mouth daily.   zinc gluconate 50 MG tablet Take 50  mg by mouth daily.   ZYRTEC PO Take 10 mg by mouth at bedtime.        Past Medical History:  Diagnosis Date   Allergy    SEASONAL/PERENNIAL   Anxiety    Arthritis    Asthma    allergy related   Depression    GAD (generalized anxiety disorder)    Gastritis 05/07/2011   from NSAID's after foot surgery   GERD (gastroesophageal reflux disease)    Hallux rigidus    Hyperthyroidism     hx graves disease. dr Lindie Spruce pcp      Hypothyroidism    (since I-131 treatment)   Osteoporosis 05/07/2007   Tendinitis of right ankle 05/06/2005    Past Surgical History:  Procedure Laterality Date   BREAST EXCISIONAL BIOPSY Right 1994   benign    BREAST SURGERY     fibroadenoma, right   COLONOSCOPY     hallux rigidus Right 11/06/2011   keller arthroplasty Left 06/29/2021   for hallux limitus. Dr. Irine Seal CUFF REPAIR     right   TONSILLECTOMY      Family History  Problem Relation Age of Onset   Arthritis Mother        OSTEO, vs poss RA   Hypothyroidism Mother    Ulcers Mother        in esophagus   Vision loss Mother        macular hole   Heart attack Father 36   Colon polyps Father        benign   Heart disease Father        MI at 30; stents and pacemaker 46; CHF   Hypothyroidism Father    Diabetes Brother        obese; pre-diabetic   Heart disease Brother 62       MI   Hypothyroidism Sister    Colon polyps Sister    Depression Sister        poss bipolar   Hypothyroidism Sister        Hashimoto's   Deep vein thrombosis Brother    Colon cancer Paternal Grandfather 80   Cancer Maternal Grandfather        esophageal   Esophageal cancer Maternal Grandfather 72   Breast  cancer Paternal Aunt    Colon polyps Paternal Aunt    COPD Paternal Aunt    Osteoporosis Paternal Aunt    Osteoporosis Cousin    Osteoporosis Paternal Grandmother    Cancer Maternal Grandmother        leukemia   Deep vein thrombosis Other    Breast cancer Maternal Aunt    Rectal cancer  Neg Hx    Stomach cancer Neg Hx     Social History:  reports that she has never smoked. She has never used smokeless tobacco. She reports current alcohol use. She reports that she does not use drugs.  Allergies:  Allergies  Allergen Reactions   Codeine Phosphate Nausea And Vomiting   Latex Itching    senitivity   ROS    Wt Readings from Last 3 Encounters:  10/22/22 133 lb (60.3 kg)  09/26/22 132 lb 6.4 oz (60.1 kg)  07/29/22 130 lb (59 kg)    She has hypothyroidism which was first diagnosed after treatment for Graves' disease several years ago  She was previously taking the brand-name Synthroid, from the Synthroid direct program Now she is taking generic preparation because of Medicare coverage  She takes her levothyroxine very consistently every morning Over the last year she has required somewhat lower doses, now taking 112 mcg, 6 days a week  She does complain of feeling fatigued but this is not new Currently TSH is 0.16 vs 0.21 with slightly higher trend on T4 levels   Lab Results  Component Value Date   TSH 0.16 (L) 10/17/2022   TSH 0.21 (L) 04/08/2022   TSH 0.51 12/12/2021   FREET4 1.19 10/17/2022   FREET4 1.12 04/08/2022   FREET4 1.05 12/12/2021      Examination:   BP 120/78 (BP Location: Left Arm, Patient Position: Sitting, Cuff Size: Normal)   Pulse 69   Ht 5\' 2"  (1.575 m)   Wt 133 lb (60.3 kg)   LMP 01/04/2005   SpO2 98%   BMI 24.33 kg/m   No tremor Biceps reflexes difficult to elicit Spine shows no tenderness or deformity     Assessments   Osteoporosis: She has postmenopausal osteoporosis without secondary factors She had a baseline T score of -3.5 along with a family history of osteoporosis  She has had Reclast infusions 6 times, with the last infusion done in 12/2020  Subsequently because and 5/23 her bone density was decreasing she is on Prolia since 12/2021  Vitamin D level is therapeutic  She will be scheduled for Prolia injection  every 6 months, next injection 8/24  She will continue calcium supplements and vitamin D   Hypothyroidism, post ablative with stable TSH on current regimen of 88 mcg generic of Synthroid She currently is taking a dosage of levothyroxine 112 mcg, 6 tablets a week She has nonspecific fatigue various other unrelated problems  Again TSH is slightly below normal  Considering her osteoporosis history she will need to cut back and go down to 88 mcg once a day, currently taking the equivalent of 96 mcg daily   Follow-up in 6 months but TSH in 2 months   Iline Buchinger 10/22/2022, 10:51 AM   -

## 2022-10-30 ENCOUNTER — Encounter: Payer: Self-pay | Admitting: Endocrinology

## 2022-10-31 DIAGNOSIS — F4381 Prolonged grief disorder: Secondary | ICD-10-CM | POA: Diagnosis not present

## 2022-10-31 DIAGNOSIS — F439 Reaction to severe stress, unspecified: Secondary | ICD-10-CM | POA: Diagnosis not present

## 2022-10-31 DIAGNOSIS — F4323 Adjustment disorder with mixed anxiety and depressed mood: Secondary | ICD-10-CM | POA: Diagnosis not present

## 2022-11-05 DIAGNOSIS — F4381 Prolonged grief disorder: Secondary | ICD-10-CM | POA: Diagnosis not present

## 2022-11-05 DIAGNOSIS — F439 Reaction to severe stress, unspecified: Secondary | ICD-10-CM | POA: Diagnosis not present

## 2022-11-05 DIAGNOSIS — F4323 Adjustment disorder with mixed anxiety and depressed mood: Secondary | ICD-10-CM | POA: Diagnosis not present

## 2022-11-12 DIAGNOSIS — F439 Reaction to severe stress, unspecified: Secondary | ICD-10-CM | POA: Diagnosis not present

## 2022-11-12 DIAGNOSIS — F4323 Adjustment disorder with mixed anxiety and depressed mood: Secondary | ICD-10-CM | POA: Diagnosis not present

## 2022-11-12 DIAGNOSIS — F4381 Prolonged grief disorder: Secondary | ICD-10-CM | POA: Diagnosis not present

## 2022-11-13 NOTE — Telephone Encounter (Signed)
Prolia VOB initiated via [email or web address]   Next Prolia inj DUE: 12/25/22  

## 2022-11-18 ENCOUNTER — Other Ambulatory Visit: Payer: Self-pay | Admitting: Family Medicine

## 2022-11-18 DIAGNOSIS — Z1231 Encounter for screening mammogram for malignant neoplasm of breast: Secondary | ICD-10-CM

## 2022-11-19 DIAGNOSIS — F4381 Prolonged grief disorder: Secondary | ICD-10-CM | POA: Diagnosis not present

## 2022-11-19 DIAGNOSIS — F439 Reaction to severe stress, unspecified: Secondary | ICD-10-CM | POA: Diagnosis not present

## 2022-11-19 DIAGNOSIS — F4323 Adjustment disorder with mixed anxiety and depressed mood: Secondary | ICD-10-CM | POA: Diagnosis not present

## 2022-12-03 DIAGNOSIS — F439 Reaction to severe stress, unspecified: Secondary | ICD-10-CM | POA: Diagnosis not present

## 2022-12-03 DIAGNOSIS — F4381 Prolonged grief disorder: Secondary | ICD-10-CM | POA: Diagnosis not present

## 2022-12-03 DIAGNOSIS — F4323 Adjustment disorder with mixed anxiety and depressed mood: Secondary | ICD-10-CM | POA: Diagnosis not present

## 2022-12-05 NOTE — Telephone Encounter (Signed)
Pt ready for scheduling on or after 12/25/22  Out-of-pocket cost due at time of visit: $0  Primary: Medicare Prolia co-insurance: 20% (approximately $320) Admin fee co-insurance: 20% (approximately $25)  Deductible: $240 of $240 met  Secondary: [employer] Medicare Supplement Plan G Prolia co-insurance: Covers Medicare Part B co-insurance Admin fee co-insurance: Covers Medicare Part B co-insurance  Deductible:  Covered by secondary  Prior Auth: NOT required PA# Valid:   ** This summary of benefits is an estimation of the patient's out-of-pocket cost. Exact cost may vary based on individual plan coverage.

## 2022-12-06 NOTE — Telephone Encounter (Signed)
Patient is scheduled for Prolia injection on 12/27/2022 at 10:15.

## 2022-12-10 ENCOUNTER — Ambulatory Visit
Admission: RE | Admit: 2022-12-10 | Discharge: 2022-12-10 | Disposition: A | Discharge status: Home / Self Care | Payer: Medicare Other | Source: Ambulatory Visit | Attending: Family Medicine | Admitting: Family Medicine

## 2022-12-10 DIAGNOSIS — F4323 Adjustment disorder with mixed anxiety and depressed mood: Secondary | ICD-10-CM | POA: Diagnosis not present

## 2022-12-10 DIAGNOSIS — Z1231 Encounter for screening mammogram for malignant neoplasm of breast: Secondary | ICD-10-CM | POA: Diagnosis not present

## 2022-12-10 DIAGNOSIS — F4381 Prolonged grief disorder: Secondary | ICD-10-CM | POA: Diagnosis not present

## 2022-12-17 DIAGNOSIS — F439 Reaction to severe stress, unspecified: Secondary | ICD-10-CM | POA: Diagnosis not present

## 2022-12-17 DIAGNOSIS — F4381 Prolonged grief disorder: Secondary | ICD-10-CM | POA: Diagnosis not present

## 2022-12-17 DIAGNOSIS — F4323 Adjustment disorder with mixed anxiety and depressed mood: Secondary | ICD-10-CM | POA: Diagnosis not present

## 2022-12-26 ENCOUNTER — Encounter: Payer: Self-pay | Admitting: Family Medicine

## 2022-12-27 ENCOUNTER — Other Ambulatory Visit (INDEPENDENT_AMBULATORY_CARE_PROVIDER_SITE_OTHER): Payer: Medicare Other

## 2022-12-27 ENCOUNTER — Ambulatory Visit (INDEPENDENT_AMBULATORY_CARE_PROVIDER_SITE_OTHER): Payer: Medicare Other

## 2022-12-27 VITALS — BP 115/85 | HR 70 | Ht 62.0 in | Wt 134.4 lb

## 2022-12-27 DIAGNOSIS — M810 Age-related osteoporosis without current pathological fracture: Secondary | ICD-10-CM | POA: Diagnosis not present

## 2022-12-27 DIAGNOSIS — E890 Postprocedural hypothyroidism: Secondary | ICD-10-CM | POA: Diagnosis not present

## 2022-12-27 LAB — TSH: TSH: 2.52 u[IU]/mL (ref 0.35–5.50)

## 2022-12-27 MED ORDER — DENOSUMAB 60 MG/ML ~~LOC~~ SOSY
60.0000 mg | PREFILLED_SYRINGE | Freq: Once | SUBCUTANEOUS | Status: AC
Start: 2022-12-27 — End: 2022-12-27
  Administered 2022-12-27: 60 mg via SUBCUTANEOUS

## 2022-12-27 MED ORDER — SERTRALINE HCL 50 MG PO TABS: 50.0000 mg | ORAL_TABLET | Freq: Every day | ORAL | 1 refills | Status: DC

## 2022-12-27 NOTE — Progress Notes (Signed)
After obtaining consent, and per orders of Dr. Lucianne Muss, injection of Prolia  given by Leota Sauers. Patient instructed to remain in clinic for 20 minutes afterwards, and to report any adverse reaction to me immediately.

## 2022-12-30 ENCOUNTER — Encounter: Payer: Self-pay | Admitting: Endocrinology

## 2023-01-01 DIAGNOSIS — F439 Reaction to severe stress, unspecified: Secondary | ICD-10-CM | POA: Diagnosis not present

## 2023-01-01 DIAGNOSIS — F4381 Prolonged grief disorder: Secondary | ICD-10-CM | POA: Diagnosis not present

## 2023-01-01 DIAGNOSIS — F4323 Adjustment disorder with mixed anxiety and depressed mood: Secondary | ICD-10-CM | POA: Diagnosis not present

## 2023-01-07 DIAGNOSIS — F439 Reaction to severe stress, unspecified: Secondary | ICD-10-CM | POA: Diagnosis not present

## 2023-01-07 DIAGNOSIS — F4381 Prolonged grief disorder: Secondary | ICD-10-CM | POA: Diagnosis not present

## 2023-01-07 DIAGNOSIS — F4323 Adjustment disorder with mixed anxiety and depressed mood: Secondary | ICD-10-CM | POA: Diagnosis not present

## 2023-01-14 DIAGNOSIS — F4381 Prolonged grief disorder: Secondary | ICD-10-CM | POA: Diagnosis not present

## 2023-01-14 DIAGNOSIS — F4323 Adjustment disorder with mixed anxiety and depressed mood: Secondary | ICD-10-CM | POA: Diagnosis not present

## 2023-01-14 DIAGNOSIS — F439 Reaction to severe stress, unspecified: Secondary | ICD-10-CM | POA: Diagnosis not present

## 2023-01-21 DIAGNOSIS — F4323 Adjustment disorder with mixed anxiety and depressed mood: Secondary | ICD-10-CM | POA: Diagnosis not present

## 2023-01-21 DIAGNOSIS — F439 Reaction to severe stress, unspecified: Secondary | ICD-10-CM | POA: Diagnosis not present

## 2023-01-21 DIAGNOSIS — F4381 Prolonged grief disorder: Secondary | ICD-10-CM | POA: Diagnosis not present

## 2023-01-25 NOTE — Telephone Encounter (Signed)
Pt received Prolia inj 12/27/22 Next Prolia inj due 06/30/23

## 2023-01-28 DIAGNOSIS — F4323 Adjustment disorder with mixed anxiety and depressed mood: Secondary | ICD-10-CM | POA: Diagnosis not present

## 2023-01-28 DIAGNOSIS — F439 Reaction to severe stress, unspecified: Secondary | ICD-10-CM | POA: Diagnosis not present

## 2023-01-28 DIAGNOSIS — F4381 Prolonged grief disorder: Secondary | ICD-10-CM | POA: Diagnosis not present

## 2023-02-11 DIAGNOSIS — F439 Reaction to severe stress, unspecified: Secondary | ICD-10-CM | POA: Diagnosis not present

## 2023-02-11 DIAGNOSIS — F4381 Prolonged grief disorder: Secondary | ICD-10-CM | POA: Diagnosis not present

## 2023-02-11 DIAGNOSIS — F4323 Adjustment disorder with mixed anxiety and depressed mood: Secondary | ICD-10-CM | POA: Diagnosis not present

## 2023-02-18 DIAGNOSIS — F4381 Prolonged grief disorder: Secondary | ICD-10-CM | POA: Diagnosis not present

## 2023-02-18 DIAGNOSIS — F4323 Adjustment disorder with mixed anxiety and depressed mood: Secondary | ICD-10-CM | POA: Diagnosis not present

## 2023-02-18 DIAGNOSIS — F439 Reaction to severe stress, unspecified: Secondary | ICD-10-CM | POA: Diagnosis not present

## 2023-02-25 DIAGNOSIS — F4323 Adjustment disorder with mixed anxiety and depressed mood: Secondary | ICD-10-CM | POA: Diagnosis not present

## 2023-02-25 DIAGNOSIS — F439 Reaction to severe stress, unspecified: Secondary | ICD-10-CM | POA: Diagnosis not present

## 2023-02-28 ENCOUNTER — Encounter: Payer: Self-pay | Admitting: Family Medicine

## 2023-03-01 MED ORDER — SERTRALINE HCL 50 MG PO TABS: 50.0000 mg | ORAL_TABLET | Freq: Every day | ORAL | 0 refills | Status: DC

## 2023-03-03 DIAGNOSIS — H1045 Other chronic allergic conjunctivitis: Secondary | ICD-10-CM | POA: Diagnosis not present

## 2023-03-03 DIAGNOSIS — J4530 Mild persistent asthma, uncomplicated: Secondary | ICD-10-CM | POA: Diagnosis not present

## 2023-03-03 DIAGNOSIS — J3081 Allergic rhinitis due to animal (cat) (dog) hair and dander: Secondary | ICD-10-CM | POA: Diagnosis not present

## 2023-03-03 DIAGNOSIS — J3089 Other allergic rhinitis: Secondary | ICD-10-CM | POA: Diagnosis not present

## 2023-03-04 DIAGNOSIS — F4323 Adjustment disorder with mixed anxiety and depressed mood: Secondary | ICD-10-CM | POA: Diagnosis not present

## 2023-03-07 ENCOUNTER — Other Ambulatory Visit: Payer: Self-pay | Admitting: Medical Genetics

## 2023-03-07 DIAGNOSIS — Z006 Encounter for examination for normal comparison and control in clinical research program: Secondary | ICD-10-CM

## 2023-03-11 DIAGNOSIS — F4323 Adjustment disorder with mixed anxiety and depressed mood: Secondary | ICD-10-CM | POA: Diagnosis not present

## 2023-03-11 DIAGNOSIS — F439 Reaction to severe stress, unspecified: Secondary | ICD-10-CM | POA: Diagnosis not present

## 2023-03-18 DIAGNOSIS — F4323 Adjustment disorder with mixed anxiety and depressed mood: Secondary | ICD-10-CM | POA: Diagnosis not present

## 2023-03-18 DIAGNOSIS — F4381 Prolonged grief disorder: Secondary | ICD-10-CM | POA: Diagnosis not present

## 2023-03-18 DIAGNOSIS — F439 Reaction to severe stress, unspecified: Secondary | ICD-10-CM | POA: Diagnosis not present

## 2023-04-01 NOTE — Progress Notes (Unsigned)
No chief complaint on file.  Patient presents for 6 month follow-up on chronic problems  Generalized anxiety:  She started sertraline 25mg  in 07/2022, and titrated up to 50 mg dose.  She continues to get regular counseling. She had been having a lot of stress, exhaustion, not sleeping well (having to get up early to start husband's tube feeds, early awakenings and dfficulty getting back to sleep).  She had tried her daughter's trazodone twice, worked better than benadryl no SE or grogginess the next morning.   GAD-7 score was 19 at her March 2024 visit. We started her on low dose sertraline--starting at 25mg , and advised to titrate up in 1-2 wks if needed. We discussed using alprazolam if needed for any worsening anxiety, especially as can be related to starting the sertraline.  She continues to get regular counseling.   Stressors include her husband's health (frequent hospitalizations related to aspiration pneumonia), and her parents (had moved to [place], weren't acclimating, got COVID, in rehab.   She uses xanax sparingly, 1/2 tablet 0-2x/week.  Still has some at home.     09/26/2022    8:35 AM 07/22/2022    3:07 PM 07/23/2021   11:00 AM 06/11/2021    3:35 PM  GAD 7 : Generalized Anxiety Score  Nervous, Anxious, on Edge 1 3 1 2   Control/stop worrying 0 3 0 1  Worry too much - different things 0 3 0 2  Trouble relaxing 3 3 0 2  Restless 3 3 0 1  Easily annoyed or irritable 0 3 1 2   Afraid - awful might happen 0 1 0 0  Total GAD 7 Score 7 19 2 10   Anxiety Difficulty Not difficult at all Very difficult  Somewhat difficult      Cystocele and OAB:  She was getting PTNS for OAB monthly, until not covered by Medicare.  She is now back on Oxybutynin. She has urgency, some leakage, but better if voiding frequently.  (Previously documented in chart that she didn't tolerate Myrbetriq--helped her bladder, but increased anxiety, faster pulse. She got HA's from ditropan and Vesicare as  well, as well as dry eyes.) +Stress incontinence with sneezing/laughing, not every time, if bladder is full. Previously used premarin cream 1-2x/week, but admits she hasn't used it in a long time. Denies dysuria, hematuria.      She has h/o vitamin D deficiency, monitored by Dr. Lucianne Muss.  Last level was 53.2 in 04/2022. She is currently taking 5000 IU daily.   Osteoporosis--Currently taking Prolia through endocrinologist office, last dose 12/2022. She is tolerating this without side effects.  She is compliant with her Calcium and D No weight-bearing exercise. ***UPDATE  (Previously treated with Actonel x 2 years in the distant past, Prolia (twice, changed due to cost). She got yearly Reclast infusions 10/2013-11/2018, took 1 year off, had another dose in 12/2020. Reportedly T was -3.5 prior to Reclast, in 09/2013.  Last DEXA (per Dr. Lucianne Muss) was 09/2021, showing T-2.5 at spine.  She was started back on Prolia in 12/2021.)    H/o Grave's disease, s/p RAI treatment, with resultant hypothyroidism. Under the care of Dr. Lucianne Muss. She denies thyroid symptoms. Medication was switched to generic when she went on medicare. Doing well on current dose.   Lab Results  Component Value Date   TSH 2.52 12/27/2022     Allergies:  She has been off immunotherapy, under the care of Dr. Bono Callas.   She is on Flonase; admits she hasn't been as  regular with her montelukast. She uses Zyrtec daily. She has some mild chronic congestion.  She denies sinus headaches. ***UPDATE   Hyperlipidemia:  Diet has changed since her husband got a feeding tube, not cooking much.  She doesn't eat red meat often.  Eggs about 5/week on average. +cheese (less than in the past). Lowfat yogurt; sometimes regular milk (only has this if in the house for grandkids), some oatmilk and almondmilk. +mayo on tuna, not often.  Lab Results  Component Value Date   CHOL 253 (H) 09/26/2022   HDL 78 09/26/2022   LDLCALC 155 (H) 09/26/2022   TRIG 113  09/26/2022   CHOLHDL 3.2 09/26/2022   The 10-year ASCVD risk score (Arnett DK, et al., 2019) is: 7%   Values used to calculate the score:     Age: 67 years     Sex: Female     Is Non-Hispanic African American: No     Diabetic: No     Tobacco smoker: No     Systolic Blood Pressure: 115 mmHg     Is BP treated: No     HDL Cholesterol: 78 mg/dL     Total Cholesterol: 253 mg/dL   PMH, PSH, SH reviewed    ROS:   PHYSICAL EXAM:  LMP 01/04/2005   Wt Readings from Last 3 Encounters:  12/27/22 134 lb 6.4 oz (61 kg)  10/22/22 133 lb (60.3 kg)  09/26/22 132 lb 6.4 oz (60.1 kg)      ASSESSMENT/PLAN:  Flu, COVID  Gad-7, phq-9 If doing well, can RF sertraline x 6 mos (#90 x 1)  Has CPE scheduled for 09/2023

## 2023-04-02 ENCOUNTER — Encounter: Payer: Self-pay | Admitting: Family Medicine

## 2023-04-02 ENCOUNTER — Ambulatory Visit (INDEPENDENT_AMBULATORY_CARE_PROVIDER_SITE_OTHER): Payer: Medicare Other | Admitting: Family Medicine

## 2023-04-02 VITALS — BP 128/76 | HR 84 | Ht 62.0 in | Wt 136.0 lb

## 2023-04-02 DIAGNOSIS — F4323 Adjustment disorder with mixed anxiety and depressed mood: Secondary | ICD-10-CM | POA: Diagnosis not present

## 2023-04-02 DIAGNOSIS — M818 Other osteoporosis without current pathological fracture: Secondary | ICD-10-CM

## 2023-04-02 DIAGNOSIS — J3089 Other allergic rhinitis: Secondary | ICD-10-CM | POA: Diagnosis not present

## 2023-04-02 DIAGNOSIS — Z7185 Encounter for immunization safety counseling: Secondary | ICD-10-CM | POA: Diagnosis not present

## 2023-04-02 DIAGNOSIS — Z23 Encounter for immunization: Secondary | ICD-10-CM

## 2023-04-02 DIAGNOSIS — N3281 Overactive bladder: Secondary | ICD-10-CM

## 2023-04-02 DIAGNOSIS — E890 Postprocedural hypothyroidism: Secondary | ICD-10-CM | POA: Diagnosis not present

## 2023-04-02 DIAGNOSIS — F439 Reaction to severe stress, unspecified: Secondary | ICD-10-CM | POA: Diagnosis not present

## 2023-04-02 DIAGNOSIS — F4381 Prolonged grief disorder: Secondary | ICD-10-CM | POA: Diagnosis not present

## 2023-04-02 DIAGNOSIS — E7800 Pure hypercholesterolemia, unspecified: Secondary | ICD-10-CM

## 2023-04-02 DIAGNOSIS — F411 Generalized anxiety disorder: Secondary | ICD-10-CM

## 2023-04-02 MED ORDER — SERTRALINE HCL 50 MG PO TABS
50.0000 mg | ORAL_TABLET | Freq: Every day | ORAL | 1 refills | Status: DC
Start: 2023-04-02 — End: 2023-10-01

## 2023-04-02 NOTE — Patient Instructions (Addendum)
I encourage you to get a flu shot (and have some benadryl on hand, if needed). I agree, not taking it today to affect your [phone] is a good idea. You can hold off on a COVID booster until 3 months have passed since your recent infection.  Please try and get weight-bearing exercise at least 2x/week.  Consider restarting [employer] classes, vs using some hand weights.  Use the montelukast daily when needed, rather than just sporadically.  It is a preventative medication and works best when taken daily (but you don't need to use it year-round if you do well certain times of the year).

## 2023-04-07 ENCOUNTER — Other Ambulatory Visit (HOSPITAL_COMMUNITY)
Admission: RE | Admit: 2023-04-07 | Discharge: 2023-04-07 | Disposition: A | Discharge status: Home / Self Care | Payer: Self-pay | Source: Ambulatory Visit | Attending: Oncology | Admitting: Oncology

## 2023-04-07 DIAGNOSIS — Z006 Encounter for examination for normal comparison and control in clinical research program: Secondary | ICD-10-CM | POA: Insufficient documentation

## 2023-04-08 DIAGNOSIS — F4323 Adjustment disorder with mixed anxiety and depressed mood: Secondary | ICD-10-CM | POA: Diagnosis not present

## 2023-04-08 DIAGNOSIS — F439 Reaction to severe stress, unspecified: Secondary | ICD-10-CM | POA: Diagnosis not present

## 2023-04-08 DIAGNOSIS — F4381 Prolonged grief disorder: Secondary | ICD-10-CM | POA: Diagnosis not present

## 2023-04-14 ENCOUNTER — Encounter: Payer: Self-pay | Admitting: Endocrinology

## 2023-04-14 ENCOUNTER — Ambulatory Visit (INDEPENDENT_AMBULATORY_CARE_PROVIDER_SITE_OTHER): Payer: Medicare Other | Admitting: Endocrinology

## 2023-04-14 VITALS — BP 120/80 | HR 81 | Resp 18 | Ht 62.0 in | Wt 135.6 lb

## 2023-04-14 DIAGNOSIS — M810 Age-related osteoporosis without current pathological fracture: Secondary | ICD-10-CM | POA: Diagnosis not present

## 2023-04-14 DIAGNOSIS — E890 Postprocedural hypothyroidism: Secondary | ICD-10-CM | POA: Diagnosis not present

## 2023-04-14 NOTE — Progress Notes (Signed)
Outpatient Endocrinology Note Theresa Malayiah Mcbrayer, MD  04/14/23  Patient's Name: Theresa Schneider    DOB: 01/31/1956    MRN: [id number]  REASON OF VISIT: Follow-up for hypothyroidism and osteoporosis.  PCP: Joselyn Arrow, MD  HISTORY OF PRESENT ILLNESS:   Theresa Schneider is a 67 y.o. old female with past medical history as listed below is presented for a follow up  for hypothyroidism and osteoporosis.   Pertinent History: # Osteoporosis history: She has previously been evaluated and treated by her gynecologist since about 2004. She was given a trial of Actonel about 20 + years ago. She  had difficulty tolerating this because of abdominal discomfort and not clear if she took this regularly, probably not over 2 years. She was subsequently given Prolia starting in about 2012 and she got only 3 injections. The injection was stopped because of lack of insurance coverage. She was started on Reclast in 10/2013 when her bone density done by her gynecologist on 09/21/13 showed a T score of -3.5 at the lumbar spine. Subsequently she has had a total of 6 Reclast infusions  Last infusion was 01/01/2021 with a gap of 2 years.   Since her bone density was showing slight decrease at the spine in 09/2021 she is now taking PROLIA with the first injection on 12/19/2021 and every 6 months. No side effects with the Prolia.    Results from bone density of 09/18/2021 with comparisons as below   T-score AP Spine  L1-L4      09/18/2021    67.8         -2.5    0.886 g/cm2 AP Spine  L1-L4      01/14/2019    65.2         -2.3    0.904 g/cm2   DualFemur Neck Left  09/18/2021    67.8         -2.0    0.766 g/cm2 DualFemur Neck Left  01/14/2019    65.2         -1.8    0.781 g/cm2   DualFemur Total Mean 09/18/2021    67.8         -1.4    0.830 g/cm2 DualFemur Total Mean 01/14/2019    65.2         -1.5    0.815 g/cm2     Vit D 5000 units daily. Calcium 600 mg daily. Cheese mostly.   # Postablative hypothyroidism -Patient had  radioactive iodine ablation several years ago for Graves' disease.  She has been on thyroid hormone replacement since then.  She was to be on brand-name Synthroid from Synthroid direct program.  She has been now taking generic levothyroxine.  She has required periodic dose adjustment in the past.   Interval history Patient has been taking vitamin D and calcium supplement regularly.  She has no fall and fracture.  She had Prolia injection in August.  She has been taking levothyroxine 88 mcg daily, dose was decreased in June and had normal TSH after dose adjustment in August 2024.  She had no other complaints today.  No hypo and hyperthyroid symptoms.  Denies palpitation or heat intolerance.  REVIEW OF SYSTEMS:  As per history of present illness.   PAST MEDICAL HISTORY: Past Medical History:  Diagnosis Date   Allergy    SEASONAL/PERENNIAL   Anxiety    Arthritis    Asthma    allergy related   Depression    GAD (generalized anxiety  disorder)    Gastritis 05/07/2011   from NSAID's after foot surgery   GERD (gastroesophageal reflux disease)    Hallux rigidus    Hyperthyroidism     hx graves disease. dr Lindie Spruce pcp      Hypothyroidism    (since I-131 treatment)   Osteoporosis 05/07/2007   Tendinitis of right ankle 05/06/2005    PAST SURGICAL HISTORY: Past Surgical History:  Procedure Laterality Date   BREAST EXCISIONAL BIOPSY Right 1994   benign    BREAST SURGERY     fibroadenoma, right   COLONOSCOPY     hallux rigidus Right 11/06/2011   keller arthroplasty Left 06/29/2021   for hallux limitus. Dr. Al Corpus   ROTATOR CUFF REPAIR     right   TONSILLECTOMY      ALLERGIES: Allergies  Allergen Reactions   Codeine Phosphate Nausea And Vomiting   Latex Itching    senitivity    FAMILY HISTORY:  Family History  Problem Relation Age of Onset   Arthritis Mother        OSTEO, vs poss RA   Hypothyroidism Mother    Ulcers Mother        in esophagus   Vision loss Mother         macular hole   Heart attack Father 38   Colon polyps Father        benign   Heart disease Father        MI at 49; stents and pacemaker 32; CHF   Hypothyroidism Father    Diabetes Brother        obese; pre-diabetic   Heart disease Brother 66       MI   Hypothyroidism Sister    Colon polyps Sister    Depression Sister        poss bipolar   Hypothyroidism Sister        Hashimoto's   Deep vein thrombosis Brother    Colon cancer Paternal Grandfather 63   Cancer Maternal Grandfather        esophageal   Esophageal cancer Maternal Grandfather 35   Breast cancer Paternal Aunt    Colon polyps Paternal Aunt    COPD Paternal Aunt    Osteoporosis Paternal Aunt    Osteoporosis Cousin    Osteoporosis Paternal Grandmother    Cancer Maternal Grandmother        leukemia   Deep vein thrombosis Other    Breast cancer Maternal Aunt    Rectal cancer Neg Hx    Stomach cancer Neg Hx     SOCIAL HISTORY: Social History   Socioeconomic History   Marital status: Married    Spouse name: Not on file   Number of children: 3   Years of education: Not on file   Highest education level: Not on file  Occupational History   Occupation: RADIOLOGY Presenter, broadcasting: [employer]  Tobacco Use   Smoking status: Never   Smokeless tobacco: Never  Vaping Use   Vaping status: Never Used  Substance and Sexual Activity   Alcohol use: Yes    Alcohol/week: 0.0 standard drinks of alcohol    Comment: a few drinks/year (beach trip with girlfriends, wine at holiday)   Drug use: No   Sexual activity: Not Currently    Partners: Male    Birth control/protection: Post-menopausal    Comment: not currently due to husband's health  Other Topics Concern   Not on file  Social History Narrative   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   Married, husband with throat CA.   3 children, all living in the [place] area, 7 grandchildren   Elderly parents now live in [place]      Updated  09/2022   Social Determinants of Health   Financial Resource Strain: Low Risk  (04/02/2023)   Overall Financial Resource Strain (CARDIA)    Difficulty of Paying Living Expenses: Not hard at all  Food Insecurity: No Food Insecurity (04/02/2023)   Hunger Vital Sign    Worried About Running Out of Food in the Last Year: Never true    Ran Out of Food in the Last Year: Never true  Transportation Needs: No Transportation Needs (04/02/2023)   PRAPARE - Administrator, Civil Service (Medical): No    Lack of Transportation (Non-Medical): No  Physical Activity: Sufficiently Active (04/02/2023)   Exercise Vital Sign    Days of Exercise per Week: 4 days    Minutes of Exercise per Session: 130 min  Stress: Stress Concern Present (04/02/2023)   [employer] of Occupational Health - Occupational Stress Questionnaire    Feeling of Stress : To some extent  Social Connections: Moderately Integrated (04/02/2023)   Social Connection and Isolation Panel [NHANES]    Frequency of Communication with Friends and Family: More than three times a week    Frequency of Social Gatherings with Friends and Family: Twice a week    Attends Religious Services: More than 4 times per year    Active Member of [employer] or Organizations: No    Attends Banker Meetings: Never    Marital Status: Married    MEDICATIONS:  Current Outpatient Medications  Medication Sig Dispense Refill   ALPRAZolam (XANAX) 0.25 MG tablet Take 1-2 tablets (0.25-0.5 mg total) by mouth 3 (three) times daily as needed for anxiety. 15 tablet 0   azelastine (ASTELIN) 0.1 % nasal spray 1-2 puff in each nostril     B Complex-Folic Acid (B COMPLEX VITAMINS, W/ FA,) CAPS Take by mouth.     calcium carbonate (CALCIUM 600) 600 MG TABS tablet Take 600 mg by mouth daily.     calcium carbonate (TUMS - DOSED IN MG ELEMENTAL CALCIUM) 500 MG chewable tablet Chew 2 tablets by mouth daily.     Cetirizine HCl (ZYRTEC PO) Take 10 mg by  mouth at bedtime.     cholecalciferol (VITAMIN D) 1000 units tablet Take 1,000 Units by mouth daily. 5000 units in the winter     CO ENZYME Q-10 PO Take 100 mg by mouth daily.     Denosumab (PROLIA ) Inject into the skin every 6 (six) months.     fluticasone (FLONASE) 50 MCG/ACT nasal spray Place 1 spray into both nostrils daily.     levothyroxine (SYNTHROID) 88 MCG tablet Take 1 tablet (88 mcg total) by mouth daily. 90 tablet 3   Magnesium 400 MG CAPS Take 400 mg by mouth daily.     montelukast (SINGULAIR) 10 MG tablet Take 10 mg by mouth daily.  6   NON FORMULARY Take 4-6 drops by mouth daily.     oxybutynin (DITROPAN-XL) 5 MG 24 hr tablet Take 5 mg by mouth daily.     PREMARIN vaginal cream 1 applicator.  3   PROAIR HFA 108 (90 Base) MCG/ACT inhaler      sertraline (ZOLOFT) 50 MG tablet Take 1 tablet (50 mg total) by mouth daily. 90 tablet 1   TURMERIC PO Take  1 capsule by mouth daily.     vitamin C (ASCORBIC ACID) 500 MG tablet Take 500 mg by mouth daily.     zinc gluconate 50 MG tablet Take 50 mg by mouth daily.     No current facility-administered medications for this visit.    PHYSICAL EXAM: Vitals:   04/14/23 0957  BP: 120/80  Pulse: 81  Resp: 18  SpO2: 98%  Weight: 135 lb 9.6 oz (61.5 kg)  Height: 5\' 2"  (1.575 m)   Body mass index is 24.8 kg/m.  Wt Readings from Last 3 Encounters:  04/14/23 135 lb 9.6 oz (61.5 kg)  04/02/23 136 lb (61.7 kg)  12/27/22 134 lb 6.4 oz (61 kg)    General: Well developed, well nourished female in no apparent distress.  HEENT: AT/[place], no external lesions. Hearing intact to the spoken word Eyes: Conjunctiva clear and no icterus. Neck: Trachea midline, neck supple  Neurologic: Alert, oriented, normal speech Extremities: No pedal pitting edema, no tremors of outstretched hands Skin: Warm, color good.  Psychiatric: Does not appear depressed or anxious  PERTINENT HISTORIC LABORATORY AND IMAGING STUDIES:  All pertinent laboratory results  were reviewed. Please see HPI also for further details.   TSH  Date Value Ref Range Status  12/27/2022 2.52 0.35 - 5.50 uIU/mL Final  10/17/2022 0.16 (L) 0.35 - 5.50 uIU/mL Final  04/08/2022 0.21 (L) 0.35 - 5.50 uIU/mL Final     ASSESSMENT / PLAN  1. Osteoporosis, postmenopausal   2. Postablative hypothyroidism    # Osteoporosis -Patient is currently on Prolia.  She was treated with Reclast for 6 - 7 years from 2015-2022. -Prolia was started in August 2023. -Continue Prolia 60 mg subcutaneous every 6 months. -Check DEXA scan May/June 2025. -Continue current vitamin D and calcium supplement. -Discussed fall precaution and weightbearing exercise.  # Postablative hypothyroidism -Continue levothyroxine 88 mcg daily.  She had normal TSH in August 2024.  Lab in 6 months prior to follow-up visit.  Diagnoses and all orders for this visit:  Osteoporosis, postmenopausal -     DG Bone Density; Future -     Renal function panel -     VITAMIN D 25 Hydroxy (Vit-D Deficiency, Fractures)  Postablative hypothyroidism -     T4, free -     TSH    DISPOSITION Follow up in clinic in 6 months suggested.  All questions answered and patient verbalized understanding of the plan.  Theresa Kolleen Ochsner, MD [facility] [address] [place], [address] [place], [place] [address] Phone # [phone]  At least part of this note was generated using voice recognition software. Inadvertent word errors may have occurred, which were not recognized during the proofreading process.

## 2023-04-14 NOTE — Patient Instructions (Signed)
Lab in 6 months, Labs 2-3 days prior to follow up visit with me.    Bone density in May 2025.

## 2023-04-15 DIAGNOSIS — F4323 Adjustment disorder with mixed anxiety and depressed mood: Secondary | ICD-10-CM | POA: Diagnosis not present

## 2023-04-15 DIAGNOSIS — F439 Reaction to severe stress, unspecified: Secondary | ICD-10-CM | POA: Diagnosis not present

## 2023-04-15 LAB — GENECONNECT MOLECULAR SCREEN: Genetic Analysis Overall Interpretation: NEGATIVE

## 2023-04-23 DIAGNOSIS — F4323 Adjustment disorder with mixed anxiety and depressed mood: Secondary | ICD-10-CM | POA: Diagnosis not present

## 2023-04-23 DIAGNOSIS — F4381 Prolonged grief disorder: Secondary | ICD-10-CM | POA: Diagnosis not present

## 2023-04-23 DIAGNOSIS — F439 Reaction to severe stress, unspecified: Secondary | ICD-10-CM | POA: Diagnosis not present

## 2023-04-29 DIAGNOSIS — F4323 Adjustment disorder with mixed anxiety and depressed mood: Secondary | ICD-10-CM | POA: Diagnosis not present

## 2023-04-29 DIAGNOSIS — F439 Reaction to severe stress, unspecified: Secondary | ICD-10-CM | POA: Diagnosis not present

## 2023-04-29 DIAGNOSIS — F4381 Prolonged grief disorder: Secondary | ICD-10-CM | POA: Diagnosis not present

## 2023-05-13 DIAGNOSIS — F439 Reaction to severe stress, unspecified: Secondary | ICD-10-CM | POA: Diagnosis not present

## 2023-05-13 DIAGNOSIS — F4381 Prolonged grief disorder: Secondary | ICD-10-CM | POA: Diagnosis not present

## 2023-05-13 DIAGNOSIS — F4323 Adjustment disorder with mixed anxiety and depressed mood: Secondary | ICD-10-CM | POA: Diagnosis not present

## 2023-05-20 DIAGNOSIS — F4381 Prolonged grief disorder: Secondary | ICD-10-CM | POA: Diagnosis not present

## 2023-05-20 DIAGNOSIS — F4323 Adjustment disorder with mixed anxiety and depressed mood: Secondary | ICD-10-CM | POA: Diagnosis not present

## 2023-05-27 DIAGNOSIS — F4323 Adjustment disorder with mixed anxiety and depressed mood: Secondary | ICD-10-CM | POA: Diagnosis not present

## 2023-05-27 DIAGNOSIS — F439 Reaction to severe stress, unspecified: Secondary | ICD-10-CM | POA: Diagnosis not present

## 2023-05-27 DIAGNOSIS — F4381 Prolonged grief disorder: Secondary | ICD-10-CM | POA: Diagnosis not present

## 2023-06-03 DIAGNOSIS — F439 Reaction to severe stress, unspecified: Secondary | ICD-10-CM | POA: Diagnosis not present

## 2023-06-03 DIAGNOSIS — F4323 Adjustment disorder with mixed anxiety and depressed mood: Secondary | ICD-10-CM | POA: Diagnosis not present

## 2023-06-03 DIAGNOSIS — F4381 Prolonged grief disorder: Secondary | ICD-10-CM | POA: Diagnosis not present

## 2023-06-10 DIAGNOSIS — F4381 Prolonged grief disorder: Secondary | ICD-10-CM | POA: Diagnosis not present

## 2023-06-10 DIAGNOSIS — F4323 Adjustment disorder with mixed anxiety and depressed mood: Secondary | ICD-10-CM | POA: Diagnosis not present

## 2023-06-10 DIAGNOSIS — F439 Reaction to severe stress, unspecified: Secondary | ICD-10-CM | POA: Diagnosis not present

## 2023-06-10 NOTE — Telephone Encounter (Signed)
Prolia VOB initiated via [email or web address]  Next Prolia inj DUE: 06/30/23

## 2023-06-17 DIAGNOSIS — F4323 Adjustment disorder with mixed anxiety and depressed mood: Secondary | ICD-10-CM | POA: Diagnosis not present

## 2023-06-17 DIAGNOSIS — F4381 Prolonged grief disorder: Secondary | ICD-10-CM | POA: Diagnosis not present

## 2023-06-17 NOTE — Telephone Encounter (Signed)
Medical Buy and Annette Stable - Prior Authorization NOT required

## 2023-06-17 NOTE — Telephone Encounter (Signed)
Patient is ready for scheduling on or after 06/30/23 BUY AND BILL  Out-of-pocket cost due at time of visit: $0  Primary: Medicare Prolia co-insurance: 20% (approximately $331.87) Admin fee co-insurance: 20% (approximately $25)  Deductible: $257 of $257 met  Prior Auth: NOT required  Secondary: Aetna Medicare Supplement Plan G Prolia co-insurance: Covers Medicare Part B co-insurance Admin fee co-insurance: Covers Medicare Part B co-insurance  Deductible: does NOT cover Medicare deductible of which $257 of $257 has been met  Prior Auth: NOT required PA# Valid:   ** This summary of benefits is an estimation of the patient's out-of-pocket cost. Exact cost may vary based on individual plan coverage.

## 2023-06-24 DIAGNOSIS — F4323 Adjustment disorder with mixed anxiety and depressed mood: Secondary | ICD-10-CM | POA: Diagnosis not present

## 2023-07-01 DIAGNOSIS — F4381 Prolonged grief disorder: Secondary | ICD-10-CM | POA: Diagnosis not present

## 2023-07-01 DIAGNOSIS — F439 Reaction to severe stress, unspecified: Secondary | ICD-10-CM | POA: Diagnosis not present

## 2023-07-01 DIAGNOSIS — F4323 Adjustment disorder with mixed anxiety and depressed mood: Secondary | ICD-10-CM | POA: Diagnosis not present

## 2023-07-02 ENCOUNTER — Ambulatory Visit (INDEPENDENT_AMBULATORY_CARE_PROVIDER_SITE_OTHER): Payer: Medicare Other

## 2023-07-02 VITALS — BP 122/70 | HR 80 | Wt 139.8 lb

## 2023-07-02 DIAGNOSIS — M810 Age-related osteoporosis without current pathological fracture: Secondary | ICD-10-CM | POA: Diagnosis not present

## 2023-07-02 MED ORDER — DENOSUMAB 60 MG/ML ~~LOC~~ SOSY: 60.0000 mg | PREFILLED_SYRINGE | Freq: Once | SUBCUTANEOUS | Status: AC

## 2023-07-02 MED ORDER — DENOSUMAB 60 MG/ML ~~LOC~~ SOSY
60.0000 mg | PREFILLED_SYRINGE | Freq: Once | SUBCUTANEOUS | Status: AC
  Administered 2023-07-02: 60 mg via SUBCUTANEOUS

## 2023-07-02 NOTE — Progress Notes (Signed)
 After obtaining consent, and per orders of Dr. Erroll Luna, injection of Prolia 60 mg given by Beverely Pace. Patient instructed to remain in clinic for 20 minutes afterwards, and to report any adverse reaction to me immediately.

## 2023-07-05 NOTE — Telephone Encounter (Signed)
 Last Prolia inj 07/02/23 Next Prolia inj due 12/31/23

## 2023-07-08 DIAGNOSIS — F4323 Adjustment disorder with mixed anxiety and depressed mood: Secondary | ICD-10-CM | POA: Diagnosis not present

## 2023-07-15 DIAGNOSIS — L738 Other specified follicular disorders: Secondary | ICD-10-CM | POA: Diagnosis not present

## 2023-07-15 DIAGNOSIS — L308 Other specified dermatitis: Secondary | ICD-10-CM | POA: Diagnosis not present

## 2023-07-15 DIAGNOSIS — L812 Freckles: Secondary | ICD-10-CM | POA: Diagnosis not present

## 2023-07-15 DIAGNOSIS — L438 Other lichen planus: Secondary | ICD-10-CM | POA: Diagnosis not present

## 2023-07-15 DIAGNOSIS — L821 Other seborrheic keratosis: Secondary | ICD-10-CM | POA: Diagnosis not present

## 2023-07-15 DIAGNOSIS — F4323 Adjustment disorder with mixed anxiety and depressed mood: Secondary | ICD-10-CM | POA: Diagnosis not present

## 2023-07-15 DIAGNOSIS — L570 Actinic keratosis: Secondary | ICD-10-CM | POA: Diagnosis not present

## 2023-07-15 DIAGNOSIS — D225 Melanocytic nevi of trunk: Secondary | ICD-10-CM | POA: Diagnosis not present

## 2023-07-29 DIAGNOSIS — F4323 Adjustment disorder with mixed anxiety and depressed mood: Secondary | ICD-10-CM | POA: Diagnosis not present

## 2023-07-29 DIAGNOSIS — F439 Reaction to severe stress, unspecified: Secondary | ICD-10-CM | POA: Diagnosis not present

## 2023-07-29 DIAGNOSIS — F4381 Prolonged grief disorder: Secondary | ICD-10-CM | POA: Diagnosis not present

## 2023-08-05 DIAGNOSIS — F4323 Adjustment disorder with mixed anxiety and depressed mood: Secondary | ICD-10-CM | POA: Diagnosis not present

## 2023-08-12 DIAGNOSIS — F439 Reaction to severe stress, unspecified: Secondary | ICD-10-CM | POA: Diagnosis not present

## 2023-08-12 DIAGNOSIS — F4323 Adjustment disorder with mixed anxiety and depressed mood: Secondary | ICD-10-CM | POA: Diagnosis not present

## 2023-08-19 DIAGNOSIS — F4323 Adjustment disorder with mixed anxiety and depressed mood: Secondary | ICD-10-CM | POA: Diagnosis not present

## 2023-08-26 DIAGNOSIS — F4381 Prolonged grief disorder: Secondary | ICD-10-CM | POA: Diagnosis not present

## 2023-08-26 DIAGNOSIS — F4323 Adjustment disorder with mixed anxiety and depressed mood: Secondary | ICD-10-CM | POA: Diagnosis not present

## 2023-08-26 DIAGNOSIS — F439 Reaction to severe stress, unspecified: Secondary | ICD-10-CM | POA: Diagnosis not present

## 2023-09-02 DIAGNOSIS — F4323 Adjustment disorder with mixed anxiety and depressed mood: Secondary | ICD-10-CM | POA: Diagnosis not present

## 2023-09-02 DIAGNOSIS — F439 Reaction to severe stress, unspecified: Secondary | ICD-10-CM | POA: Diagnosis not present

## 2023-09-02 DIAGNOSIS — F4381 Prolonged grief disorder: Secondary | ICD-10-CM | POA: Diagnosis not present

## 2023-09-09 DIAGNOSIS — F4381 Prolonged grief disorder: Secondary | ICD-10-CM | POA: Diagnosis not present

## 2023-09-09 DIAGNOSIS — F4323 Adjustment disorder with mixed anxiety and depressed mood: Secondary | ICD-10-CM | POA: Diagnosis not present

## 2023-09-16 DIAGNOSIS — F4381 Prolonged grief disorder: Secondary | ICD-10-CM | POA: Diagnosis not present

## 2023-09-16 DIAGNOSIS — F439 Reaction to severe stress, unspecified: Secondary | ICD-10-CM | POA: Diagnosis not present

## 2023-09-16 DIAGNOSIS — F4323 Adjustment disorder with mixed anxiety and depressed mood: Secondary | ICD-10-CM | POA: Diagnosis not present

## 2023-09-23 DIAGNOSIS — F439 Reaction to severe stress, unspecified: Secondary | ICD-10-CM | POA: Diagnosis not present

## 2023-09-23 DIAGNOSIS — F4323 Adjustment disorder with mixed anxiety and depressed mood: Secondary | ICD-10-CM | POA: Diagnosis not present

## 2023-09-23 DIAGNOSIS — F4381 Prolonged grief disorder: Secondary | ICD-10-CM | POA: Diagnosis not present

## 2023-09-30 DIAGNOSIS — F439 Reaction to severe stress, unspecified: Secondary | ICD-10-CM | POA: Diagnosis not present

## 2023-09-30 DIAGNOSIS — F4381 Prolonged grief disorder: Secondary | ICD-10-CM | POA: Diagnosis not present

## 2023-09-30 DIAGNOSIS — F4323 Adjustment disorder with mixed anxiety and depressed mood: Secondary | ICD-10-CM | POA: Diagnosis not present

## 2023-09-30 NOTE — Patient Instructions (Incomplete)
 HEALTH MAINTENANCE RECOMMENDATIONS:  It is recommended that you get at least 30 minutes of aerobic exercise at least 5 days/week (for weight loss, you may need as much as 60-90 minutes). This can be any activity that gets your heart rate up. This can be divided in 10-15 minute intervals if needed, but try and build up your endurance at least once a week.  Weight bearing exercise is also recommended twice weekly.  Eat a healthy diet with lots of vegetables, fruits and fiber.  "Colorful" foods have a lot of vitamins (ie green vegetables, tomatoes, red peppers, etc).  Limit sweet tea, regular sodas and alcoholic beverages, all of which has a lot of calories and sugar.  Up to 1 alcoholic drink daily may be beneficial for women (unless trying to lose weight, watch sugars).  Drink a lot of water.  Calcium recommendations are 1200-1500 mg daily (1500 mg for postmenopausal women or women without ovaries), and vitamin D  1000 IU daily.  This should be obtained from diet and/or supplements (vitamins), and calcium should not be taken all at once, but in divided doses.  Monthly self breast exams and yearly mammograms for women over the age of 43 is recommended.  Sunscreen of at least SPF 30 should be used on all sun-exposed parts of the skin when outside between the hours of 10 am and 4 pm (not just when at beach or pool, but even with exercise, golf, tennis, and yard work!)  Use a sunscreen that says "broad spectrum" so it covers both UVA and UVB rays, and make sure to reapply every 1-2 hours.  Remember to change the batteries in your smoke detectors when changing your clock times in the spring and fall. Carbon monoxide detectors are recommended for your home.  Use your seat belt every time you are in a car, and please drive safely and not be distracted with cell phones and texting while driving.   Ms. Gruwell , Thank you for taking time to come for your Medicare Wellness Visit. I appreciate your ongoing  commitment to your health goals. Please review the following plan we discussed and let me know if I can assist you in the future.   This is a list of the screening recommended for you and due dates:  Health Maintenance  Topic Date Due   Zoster (Shingles) Vaccine (1 of 2) Never done   COVID-19 Vaccine (4 - 2024-25 season) 01/05/2023   Medicare Annual Wellness Visit  09/26/2023   DTaP/Tdap/Td vaccine (3 - Td or Tdap) 11/22/2023   Flu Shot  12/05/2023   Mammogram  12/10/2023   Colon Cancer Screening  07/28/2029   Pneumonia Vaccine  Completed   DEXA scan (bone density measurement)  Completed   Hepatitis C Screening  Completed   HPV Vaccine  Aged Out   Meningitis B Vaccine  Aged Out   You are due for a tetanus booster (TdaP). Please get this from the pharmacy.  Yearly high dose flu shots are recommended in the Fall. COVID booster is recommended when updated in the Fall.  Shingles vaccine is recommended--a series of 2 vaccines 2 months apart. You need to get this from the pharmacy.  Please bring us  copies of your Living Will and Healthcare Power of Attorney so that it can be scanned into your medical chart.   Increase the sertraline  to 1.5 tablet (75 mg) for a week.  If doing well without side effects, further increase to 2 tablets. I'm sending in a prescription  for 100 mg dose. Return in 6 weeks for follow-up (can be virtual). Continue to get counseling. Use the alprazolam  (xanax ) sparingly, 1/2-1 tablet, and be sure not to drive or use alcohol while taking this.   Consider taking the omeprazole  prior to dinner (especially when eating late, if not every day). You likely won't need Tums.  Constipation is likely from the oxybutynin.  Your endo is checking your thyroid  soon (another possible cause).  Be sure to eat a high fiber diet (lots of fruits and vegetables).  Watch your portions of the nuts (limit to 1 handful), and eat more yogurt and fruits and vegetables as snacks (along with  the nuts).

## 2023-09-30 NOTE — Progress Notes (Unsigned)
 No chief complaint on file.  Theresa Schneider is a 68 y.o. female who presents for Medicare Wellness visit, and follow-up on chronic medical conditions.    Generalized anxiety:  She started sertraline  25mg  in 07/2022, and titrated up to 50 mg dose.  She continues to get regular counseling. She had been having a lot of stress, exhaustion, not sleeping well (having to get up early to start husband's tube feeds, early awakenings and dfficulty getting back to sleep). GAD-7 score was 19 at her March 2024 visit.  She reports doing better on the sertraline .  Not having side effects.  Is sleeping better.   Stressors include her husband's health (frequent hospitalizations related to aspiration pneumonia)--she still has to "do everything" for him, but hasn't been hospitalized; other stressor is her parents (had moved to [place], weren't acclimating, got COVID, went to rehab). Mother has some dementia, won't really do anything for herself. She goes to visit most days. They have caregivers 4 hours/day.   She uses xanax  sparingly, 1/2 tablet, 2-3 times/month  Still has some at home. Last rx'd #15 in 07/2022.  Cystocele and OAB:  She was getting PTNS for OAB monthly, until not covered by Medicare.  She is now back on Oxybutynin, and "doing okay".  She has dry eyes, so takes it about every 3 days (much worse if she doesn't take it). She has urgency, some leakage, but better if voiding frequently.  (Previously documented in chart that she didn't tolerate Myrbetriq--helped her bladder, but increased anxiety, faster pulse. She got HA's from ditropan and Vesicare as well, as well as dry eyes.)  +Stress incontinence with sneezing/laughing, mainly if bladder is full. Previously used premarin cream 1-2x/week, but admits she hasn't used it in a long time. Denies dysuria, hematuria.    She has h/o vitamin D  deficiency, previously monitored by Dr. Hubert Madden.  Last level was 53.2 in 04/2022. She is currently taking 5000  IU daily. *** She is scheduled to see endocrinologist next month, with labs prior, including vitamin D  level.   Osteoporosis--Currently taking Prolia  through endocrinologist office, last dose 06/2023. She is tolerating this without side effects.  She is compliant with her Calcium and D. No weight-bearing exercise  DEXA is scheduled for 12/2023.  (Previously treated with Actonel x 2 years in the distant past, Prolia  (twice, changed due to cost). She got yearly Reclast  infusions 10/2013-11/2018, took 1 year off, had another dose in 12/2020. Reportedly T was -3.5 prior to Reclast , in 09/2013.  Last DEXA (per Dr. Hubert Madden) was 09/2021, showing T-2.5 at spine.  She was started back on Prolia  in 12/2021.)     H/o Grave's disease, s/p RAI treatment, with resultant hypothyroidism. Under the care of Dr. Aretha Kubas. Scheduled for TSH through endo next month. She denies thyroid  symptoms.  Medication was switched to generic when she went on medicare.  Lab Results  Component Value Date   TSH 2.52 12/27/2022     Allergies:  She has been off immunotherapy, under the care of Dr. Almeda Jacobs.   She is on Flonase; uses montelukast sporadically.  She uses Zyrtec daily. She has some mild chronic congestion.  She denies sinus headaches.  She has some cough related to her dry mouth.   Hyperlipidemia:  She doesn't eat red meat often.  Eggs about 5/week on average. +cheese (less than in the past). "I'm addicted to cheese". Lowfat yogurt; sometimes regular milk (only has this if in the house for grandkids), some oatmilk and almondmilk. +mayo  on tuna, not often. Last lipids had persistently elevated LDL, but ASCVD was okay. Excellent HDL. We discussed continuing to try and limit cheese, follow low cholesterol diet. Recheck at physical.  Lab Results  Component Value Date   CHOL 253 (H) 09/26/2022   HDL 78 09/26/2022   LDLCALC 155 (H) 09/26/2022   TRIG 113 09/26/2022   CHOLHDL 3.2 09/26/2022   The 10-year ASCVD risk score (Arnett  DK, et al., 2019) was: 8.5%   GERD: This is overall improved.  She continues to eat earlier since retiring.  Head of bed is elevated at night. She used to use omeprazole  only when laying flat (traveling, babysitting and sleeping in other beds).  Currently she takes Tums every night (felt like she was choking, wasn't sure if it was her anxiety or reflux or PND).  She doesn't wake up with reflux symptoms.  Denies dysphagia.   *** UPDATE     Immunization History  Administered Date(s) Administered   DTaP 10/05/2006   Influenza, High Dose Seasonal PF 02/19/2017, 02/08/2019   Influenza,inj,Quad PF,6+ Mos 03/10/2018, 02/08/2022   Influenza-Unspecified 02/19/2017   Moderna Sars-Covid-2 Vaccination 05/18/2019, 06/21/2019, 01/21/2020   PNEUMOCOCCAL CONJUGATE-20 09/13/2020   Pneumococcal Polysaccharide-23 09/13/2019   Tdap 11/21/2013    Last Pap smear: 09/2021, normal with no high risk HPV Last mammogram: 12/2022 Last colonoscopy:  07/2022 Dr. Rosaline Coma, tubular adenoma. Recheck 7 years Last DEXA:  09/2021 T-2.5 at spine. Scheduled for 12/2023. Dentist: twice yearly Ophtho: yearly, wears glasses. Exercise:    Walking some, sporadically (got off track). No weight-bearing exercise.   Patient Care Team: Roosvelt Colla, MD as PCP - General (Family Medicine) Zara Heymann, MD (Allergy) Clemetine Cypher, DPM as Consulting Physician (Podiatry) Albert Huff, MD as Consulting Physician (Ophthalmology) Adelbert Homans, MD as Consulting Physician (Urology) Harlen Lick, MD as Consulting Physician (Dermatology) Elly Habermann, MD as Consulting Physician (Orthopedic Surgery) Dentist: Dr. Davia Erps GI: Dr. Rosaline Coma Holistic med: Dr. Gertie Kub (in [place])--not in a while Counselor:  Benigno Brakeman     04/02/2023   10:42 AM 09/26/2022    8:33 AM 07/22/2022    3:13 PM 09/19/2021    8:48 AM 10/12/2020    3:27 PM  Depression screen PHQ 2/9  Decreased Interest 1 1 2  0 0  Down, Depressed, Hopeless 0 0 3 0 1   PHQ - 2 Score 1 1 5  0 1  Altered sleeping 1 0 3  1  Tired, decreased energy 3 3 3   0  Change in appetite 0 2 0  0  Feeling bad or failure about yourself  0 0 0  0  Trouble concentrating 0 0 2  0  Moving slowly or fidgety/restless 0 0 0  0  Suicidal thoughts 0 0 0  0  PHQ-9 Score 5 6 13  2   Difficult doing work/chores Not difficult at all Somewhat difficult Very difficult        04/02/2023   10:45 AM 09/26/2022    8:35 AM 07/22/2022    3:07 PM 07/23/2021   11:00 AM  GAD 7 : Generalized Anxiety Score  Nervous, Anxious, on Edge 3 1 3 1   Control/stop worrying 0 0 3 0  Worry too much - different things 0 0 3 0  Trouble relaxing 0 3 3 0  Restless 0 3 3 0  Easily annoyed or irritable 1 0 3 1  Afraid - awful might happen 0 0 1 0  Total GAD 7 Score 4 7 19  2  Anxiety Difficulty Not difficult at all Not difficult at all Very difficult      Falls screen:     09/26/2022    8:33 AM 07/22/2022    3:13 PM 09/19/2021    8:47 AM 09/13/2020    9:32 AM 09/13/2019    9:55 AM  Fall Risk   Falls in the past year? 1 0 0 0 0  Comment fell while watering grass-3/23      Number falls in past yr: 0 0 0 0 0  Injury with Fall? 0 0 0 0 0  Risk for fall due to : No Fall Risks No Fall Risks No Fall Risks No Fall Risks   Follow up Falls evaluation completed Falls evaluation completed Falls evaluation completed Falls evaluation completed      Functional Status Survey:        End of Life Discussion:  Patient does not have a living will and medical power of attorney. Given paperwork in the past, hasn't returned.    PMH, PSH, SH and FH were reviewed and updated.  Outpatient Encounter Medications as of 10/02/2023  Medication Sig Note   ALPRAZolam  (XANAX ) 0.25 MG tablet Take 1-2 tablets (0.25-0.5 mg total) by mouth 3 (three) times daily as needed for anxiety. 04/02/2023: As needed, takes 1/2 tab   azelastine (ASTELIN) 0.1 % nasal spray 1-2 puff in each nostril 04/02/2023: Takes in the am, if needed    B Complex-Folic Acid (B COMPLEX VITAMINS, W/ FA,) CAPS Take by mouth.    calcium carbonate (CALCIUM 600) 600 MG TABS tablet Take 600 mg by mouth daily.    calcium carbonate (TUMS - DOSED IN MG ELEMENTAL CALCIUM) 500 MG chewable tablet Chew 2 tablets by mouth daily. 09/26/2022: Takes 1 every night   Cetirizine HCl (ZYRTEC PO) Take 10 mg by mouth at bedtime.    cholecalciferol (VITAMIN D ) 1000 units tablet Take 1,000 Units by mouth daily. 5000 units in the winter 09/26/2022: 5000iu currently   CO ENZYME Q-10 PO Take 100 mg by mouth daily.    Denosumab  (PROLIA  [place]) Inject into the skin every 6 (six) months.    fluticasone (FLONASE) 50 MCG/ACT nasal spray Place 1 spray into both nostrils daily. 04/02/2023: Every night   levothyroxine  (SYNTHROID ) 88 MCG tablet Take 1 tablet (88 mcg total) by mouth daily.    Magnesium  400 MG CAPS Take 400 mg by mouth daily.    montelukast (SINGULAIR) 10 MG tablet Take 10 mg by mouth daily. 04/02/2023: Does not always take daily   NON FORMULARY Take 4-6 drops by mouth daily. 04/02/2023: Has not been using   oxybutynin (DITROPAN-XL) 5 MG 24 hr tablet Take 5 mg by mouth daily. 04/02/2023: Takes every 3rd day (dries out eyes and mouth)   PREMARIN vaginal cream 1 applicator. 04/02/2023: As needed   PROAIR HFA 108 (90 Base) MCG/ACT inhaler  04/02/2023: As needed, when she is sick   sertraline  (ZOLOFT ) 50 MG tablet Take 1 tablet (50 mg total) by mouth daily.    TURMERIC PO Take 1 capsule by mouth daily.    vitamin C (ASCORBIC ACID) 500 MG tablet Take 500 mg by mouth daily.    zinc gluconate 50 MG tablet Take 50 mg by mouth daily.    Facility-Administered Encounter Medications as of 10/02/2023  Medication   [START ON 12/30/2023] denosumab  (PROLIA ) injection 60 mg   Allergies  Allergen Reactions   Codeine Phosphate Nausea And Vomiting   Latex Itching    senitivity  ROS:  The patient denies anorexia, fever, significant weight changes, vision changes, decreased hearing,  ear pain, sore throat, breast concerns, chest pain, palpitations, dizziness, syncope, dyspnea on exertion, cough, swelling, nausea, vomiting, diarrhea, constipation, abdominal pain, melena, hematochezia, hematuria, dysuria, vaginal bleeding, discharge, odor or itch, genital lesions, numbness, tingling, weakness, tremor, suspicious skin lesions, depression, abnormal bleeding/bruising, or enlarged lymph nodes. Anxiety and insomnia has improved. Occasional HA's (less often, grinds her teeth)  Denies sinus headaches.  Vaginal dryness. No hot flashes or night sweats. Some residual L foot pain, s/p surgery (mild discomfort and swelling on the top of the foot). Chronic congestion/allergies, overall controlled. No significant heartburn. Denies dysphagia Leakage of urine with sneeze/laughing. Some urinary urgency.  Up 1-2x/night to void. Pain at base of L thumb   PHYSICAL EXAM:  LMP 01/04/2005   Wt Readings from Last 3 Encounters:  07/02/23 139 lb 12.8 oz (63.4 kg)  04/14/23 135 lb 9.6 oz (61.5 kg)  04/02/23 136 lb (61.7 kg)    General Appearance:    Alert, cooperative, talkative female in no distress, appears stated age  Head:    Normocephalic, without obvious abnormality, atraumatic  Eyes:    PERRL, conjunctiva/corneas clear, EOM's intact, fundi benign  Ears:    Normal TM's and external ear canals  Nose:   No drainage or sinus tenderness.  Throat:   normal mucosa, no lesions  Neck:   Supple, no lymphadenopathy;  thyroid :  no enlargement/ tenderness/nodules; no carotid bruit or JVD  Back:    Spine nontender, no curvature, ROM normal, no CVA tenderness  Lungs:     Clear to auscultation bilaterally without wheezes, rales or ronchi; respirations unlabored  Chest Wall:    No tenderness or deformity.    Heart:    Regular rate and rhythm, S1 and S2 normal, no murmur, rub or gallop  Breast Exam:   No nipple discharge or inversion.  No skin dimpling breast masses or tenderness. No axillary  lymphadenopathy  Abdomen:     Soft, non-tender, nondistended, normoactive bowel sounds,    no masses, no hepatosplenomegaly  Genitalia:    Exam not performed  Rectal:    Exam not performed  Extremities:   No clubbing, cyanosis or edema. WHSS L foot.   Pulses:   2+ and symmetric all extremities  Skin:   Skin color, texture, turgor normal, no rashes.  Lymph nodes:   Cervical, supraclavicular, axillary and inguinal nodes normal  Neurologic:   Normal strength, sensation and gait; reflexes 2+ and symmetric throughout                              Psych:   Normal mood, affect, hygiene and grooming.    ASSESSMENT/PLAN:  Phq-9 and gad-7  Getting D and TFT's next month prior to endo appt.  Lipids, cbc, c-met   Discussed monthly self breast exams and yearly mammograms; at least 30 minutes of aerobic activity at least 5 days/week, weight-bearing exercise at least 2x/week; proper sunscreen use reviewed; healthy diet, including goals of calcium and vitamin D  intake and alcohol recommendations (less than or equal to 1 drink/day) reviewed; regular seatbelt use; changing batteries in smoke detectors, carbon monoxide detectors.  Immunization recommendations discussed--yearly flu shots recommended.  Shingrix recommended, to get from pharmacy. Risks/SE reviewed. Bivalent COVID booster recommended, declined. RSV vaccine recommended, to get in the Fall (along with her flu shot). Colonoscopy recommendations reviewed, UTD, due 07/3029 (7 year f/u)  MOST form reviewed/signed, full code, full care Reminded to get us  copies of Living Will and healthcare POA.  F/u 6 mos, med check (anxiety).   Continue the 25 mg of sertraline , since you are doing much better, and the higher dose was too sedating during the day. If your moods worsen, you can either re-try the 50 mg dose and see if the tiredness improves, vs we can switch to a less sedating SSRI (ie lexapro , celexa ). Please work on getting some weight-bearing  exercise as we discussed (either at home, or through [employer] at [employer], whichever works best for you). Look at your calcium intake--now that you are taking Tums daily, be sure that you aren't getting too much between your diet and your other supplements.     Medicare Attestation I have personally reviewed: The patient's medical and social history Their use of alcohol, tobacco or illicit drugs Their current medications and supplements The patient's functional ability including ADLs,fall risks, home safety risks, cognitive, and hearing and visual impairment Diet and physical activities Evidence for depression or mood disorders  The patient's weight, height, BMI have been recorded in the chart.  I have made referrals, counseling, and provided education to the patient based on review of the above and I have provided the patient with a written personalized care plan for preventive services.

## 2023-10-02 ENCOUNTER — Encounter: Payer: Self-pay | Admitting: Family Medicine

## 2023-10-02 ENCOUNTER — Ambulatory Visit: Payer: Medicare Other | Admitting: Family Medicine

## 2023-10-02 VITALS — BP 130/80 | HR 68 | Ht 62.5 in | Wt 137.4 lb

## 2023-10-02 DIAGNOSIS — F411 Generalized anxiety disorder: Secondary | ICD-10-CM | POA: Diagnosis not present

## 2023-10-02 DIAGNOSIS — Z6379 Other stressful life events affecting family and household: Secondary | ICD-10-CM

## 2023-10-02 DIAGNOSIS — E890 Postprocedural hypothyroidism: Secondary | ICD-10-CM | POA: Diagnosis not present

## 2023-10-02 DIAGNOSIS — Z0000 Encounter for general adult medical examination without abnormal findings: Secondary | ICD-10-CM

## 2023-10-02 DIAGNOSIS — N3281 Overactive bladder: Secondary | ICD-10-CM | POA: Diagnosis not present

## 2023-10-02 DIAGNOSIS — E7800 Pure hypercholesterolemia, unspecified: Secondary | ICD-10-CM | POA: Diagnosis not present

## 2023-10-02 DIAGNOSIS — R635 Abnormal weight gain: Secondary | ICD-10-CM

## 2023-10-02 DIAGNOSIS — K5900 Constipation, unspecified: Secondary | ICD-10-CM

## 2023-10-02 DIAGNOSIS — Z5181 Encounter for therapeutic drug level monitoring: Secondary | ICD-10-CM

## 2023-10-02 DIAGNOSIS — M818 Other osteoporosis without current pathological fracture: Secondary | ICD-10-CM

## 2023-10-02 LAB — LIPID PANEL

## 2023-10-02 MED ORDER — ALPRAZOLAM 0.25 MG PO TABS: 0.2500 mg | ORAL_TABLET | Freq: Three times a day (TID) | ORAL | 0 refills | Status: AC | PRN

## 2023-10-02 MED ORDER — SERTRALINE HCL 100 MG PO TABS: 100.0000 mg | ORAL_TABLET | Freq: Every day | ORAL | 0 refills | Status: DC

## 2023-10-03 ENCOUNTER — Ambulatory Visit: Payer: Self-pay | Admitting: Family Medicine

## 2023-10-03 LAB — COMPREHENSIVE METABOLIC PANEL WITH GFR
ALT: 17 IU/L (ref 0–32)
AST: 22 IU/L (ref 0–40)
Albumin: 4.5 g/dL (ref 3.9–4.9)
Alkaline Phosphatase: 69 IU/L (ref 44–121)
BUN/Creatinine Ratio: 12 (ref 12–28)
BUN: 8 mg/dL (ref 8–27)
Bilirubin Total: 0.4 mg/dL (ref 0.0–1.2)
CO2: 20 mmol/L (ref 20–29)
Calcium: 9.1 mg/dL (ref 8.7–10.3)
Chloride: 101 mmol/L (ref 96–106)
Creatinine, Ser: 0.68 mg/dL (ref 0.57–1.00)
Globulin, Total: 2.9 g/dL (ref 1.5–4.5)
Glucose: 78 mg/dL (ref 70–99)
Potassium: 4.8 mmol/L (ref 3.5–5.2)
Sodium: 138 mmol/L (ref 134–144)
Total Protein: 7.4 g/dL (ref 6.0–8.5)
eGFR: 94 mL/min/{1.73_m2} (ref >59)

## 2023-10-03 LAB — CBC WITH DIFFERENTIAL/PLATELET
Basophils Absolute: 0.1 10*3/uL (ref 0.0–0.2)
Basos: 1 %
EOS (ABSOLUTE): 0.1 10*3/uL (ref 0.0–0.4)
Eos: 1 %
Hematocrit: 42.2 % (ref 34.0–46.6)
Hemoglobin: 13.5 g/dL (ref 11.1–15.9)
Immature Grans (Abs): 0 10*3/uL (ref 0.0–0.1)
Immature Granulocytes: 0 %
Lymphocytes Absolute: 2.5 10*3/uL (ref 0.7–3.1)
Lymphs: 26 %
MCH: 28.4 pg (ref 26.6–33.0)
MCHC: 32 g/dL (ref 31.5–35.7)
MCV: 89 fL (ref 79–97)
Monocytes Absolute: 1 10*3/uL — ABNORMAL HIGH (ref 0.1–0.9)
Monocytes: 10 %
Neutrophils Absolute: 5.7 10*3/uL (ref 1.4–7.0)
Neutrophils: 62 %
Platelets: 446 10*3/uL (ref 150–450)
RBC: 4.75 x10E6/uL (ref 3.77–5.28)
RDW: 12.6 % (ref 11.7–15.4)
WBC: 9.3 10*3/uL (ref 3.4–10.8)

## 2023-10-03 LAB — LIPID PANEL
Cholesterol, Total: 256 mg/dL — ABNORMAL HIGH (ref 100–199)
HDL: 74 mg/dL (ref >39)
LDL CALC COMMENT:: 3.5 ratio (ref 0.0–4.4)
LDL Chol Calc (NIH): 159 mg/dL — ABNORMAL HIGH (ref 0–99)
Triglycerides: 133 mg/dL (ref 0–149)
VLDL Cholesterol Cal: 23 mg/dL (ref 5–40)

## 2023-10-05 ENCOUNTER — Other Ambulatory Visit: Payer: Self-pay | Admitting: Family Medicine

## 2023-10-05 DIAGNOSIS — F411 Generalized anxiety disorder: Secondary | ICD-10-CM

## 2023-10-07 DIAGNOSIS — F4381 Prolonged grief disorder: Secondary | ICD-10-CM | POA: Diagnosis not present

## 2023-10-07 DIAGNOSIS — F4323 Adjustment disorder with mixed anxiety and depressed mood: Secondary | ICD-10-CM | POA: Diagnosis not present

## 2023-10-07 DIAGNOSIS — F439 Reaction to severe stress, unspecified: Secondary | ICD-10-CM | POA: Diagnosis not present

## 2023-10-13 ENCOUNTER — Ambulatory Visit: Payer: Medicare Other | Admitting: Endocrinology

## 2023-10-13 ENCOUNTER — Other Ambulatory Visit: Payer: Medicare Other

## 2023-10-13 DIAGNOSIS — N3941 Urge incontinence: Secondary | ICD-10-CM | POA: Diagnosis not present

## 2023-10-13 DIAGNOSIS — E890 Postprocedural hypothyroidism: Secondary | ICD-10-CM | POA: Diagnosis not present

## 2023-10-13 DIAGNOSIS — M810 Age-related osteoporosis without current pathological fracture: Secondary | ICD-10-CM | POA: Diagnosis not present

## 2023-10-14 ENCOUNTER — Ambulatory Visit: Payer: Self-pay | Admitting: Endocrinology

## 2023-10-14 DIAGNOSIS — F4323 Adjustment disorder with mixed anxiety and depressed mood: Secondary | ICD-10-CM | POA: Diagnosis not present

## 2023-10-14 DIAGNOSIS — F4381 Prolonged grief disorder: Secondary | ICD-10-CM | POA: Diagnosis not present

## 2023-10-14 DIAGNOSIS — F439 Reaction to severe stress, unspecified: Secondary | ICD-10-CM | POA: Diagnosis not present

## 2023-10-14 LAB — RENAL FUNCTION PANEL
Albumin: 4.1 g/dL (ref 3.6–5.1)
BUN: 11 mg/dL (ref 7–25)
CO2: 26 mmol/L (ref 20–32)
Calcium: 9.3 mg/dL (ref 8.6–10.4)
Chloride: 104 mmol/L (ref 98–110)
Creat: 0.63 mg/dL (ref 0.50–1.05)
Glucose, Bld: 88 mg/dL (ref 65–99)
Phosphorus: 3.4 mg/dL (ref 2.1–4.3)
Potassium: 4.6 mmol/L (ref 3.5–5.3)
Sodium: 139 mmol/L (ref 135–146)

## 2023-10-14 LAB — TSH: TSH: 0.42 m[IU]/L (ref 0.40–4.50)

## 2023-10-14 LAB — T4, FREE: Free T4: 1.3 ng/dL (ref 0.8–1.8)

## 2023-10-14 LAB — VITAMIN D 25 HYDROXY (VIT D DEFICIENCY, FRACTURES): Vit D, 25-Hydroxy: 48 ng/mL (ref 30–100)

## 2023-10-16 ENCOUNTER — Ambulatory Visit (INDEPENDENT_AMBULATORY_CARE_PROVIDER_SITE_OTHER): Payer: Medicare Other | Admitting: Endocrinology

## 2023-10-16 ENCOUNTER — Encounter: Payer: Self-pay | Admitting: Endocrinology

## 2023-10-16 VITALS — BP 158/92 | HR 75 | Resp 20 | Ht 62.5 in | Wt 139.0 lb

## 2023-10-16 DIAGNOSIS — M810 Age-related osteoporosis without current pathological fracture: Secondary | ICD-10-CM

## 2023-10-16 DIAGNOSIS — E890 Postprocedural hypothyroidism: Secondary | ICD-10-CM

## 2023-10-16 MED ORDER — LEVOTHYROXINE SODIUM 88 MCG PO TABS: 88.0000 ug | ORAL_TABLET | Freq: Every day | ORAL | 3 refills | Status: AC

## 2023-10-16 NOTE — Progress Notes (Signed)
 Outpatient Endocrinology Note [place] Fendi Meinhardt, MD  10/16/23  Patient's Name: Theresa Schneider    DOB: Mar 15, 1956    MRN: [id number]  REASON OF VISIT: Follow-up for hypothyroidism and osteoporosis.  PCP: Roosvelt Colla, MD  HISTORY OF PRESENT ILLNESS:   Theresa Schneider is a 69 y.o. old female with past medical history as listed below is presented for a follow up  for hypothyroidism and osteoporosis.   Pertinent History: # Osteoporosis history: She has previously been evaluated and treated by her gynecologist since about 2004. She was given a trial of Actonel about 20 + years ago. She  had difficulty tolerating this because of abdominal discomfort and not clear if she took this regularly, probably not over 2 years. She was subsequently given Prolia  starting in about 2012 and she got only 3 injections. The injection was stopped because of lack of insurance coverage. She was started on Reclast  in 10/2013 when her bone density done by her gynecologist on 09/21/13 showed a T score of -3.5 at the lumbar spine. Subsequently she has had a total of 6 Reclast  infusions  Last infusion was 01/01/2021 with a gap of 2 years.   Since her bone density was showing slight decrease at the spine in 09/2021 she is now taking PROLIA  with the first injection on 12/19/2021 and every 6 months. No side effects with the Prolia .    Results from bone density of 09/18/2021 with comparisons as below   T-score AP Spine  L1-L4      09/18/2021    67.8         -2.5    0.886 g/cm2 AP Spine  L1-L4      01/14/2019    65.2         -2.3    0.904 g/cm2   DualFemur Neck Left  09/18/2021    67.8         -2.0    0.766 g/cm2 DualFemur Neck Left  01/14/2019    65.2         -1.8    0.781 g/cm2   DualFemur Total Mean 09/18/2021    67.8         -1.4    0.830 g/cm2 DualFemur Total Mean 01/14/2019    65.2         -1.5    0.815 g/cm2     Vit D 5000 units daily. Calcium 600 mg daily. Cheese mostly.   # Postablative hypothyroidism -Patient had  radioactive iodine ablation several years ago for Graves' disease.  She has been on thyroid  hormone replacement since then.  She was to be on brand-name Synthroid  from Synthroid  direct program.  She has been now taking generic levothyroxine .  She has required periodic dose adjustment in the past.   Interval history Patient has been taking levothyroxine  88 mcg daily.  Denies palpitation or heat intolerance.  No hypo or hyperthyroid symptoms.  She has been taking vitamin D  and calcium as mentioned above.  No fall and fracture.  Last Prolia  injection was in February of this year. Recent laboratory results reviewed, normal electrolytes, stable renal function.  Vitamin D  is normal at 48.  Normal thyroid  function test.  Her blood pressure is elevated in the clinic today, asymptomatic.  Denies chest pain, headache or palpitation.  Patient reports her mother has difficult time this morning at the facility and may have dementia and she is worried about it.  REVIEW OF SYSTEMS:  As per history of present illness.  PAST MEDICAL HISTORY: Past Medical History:  Diagnosis Date   Allergy    SEASONAL/PERENNIAL   Anxiety    Arthritis    Asthma    allergy related   Depression    GAD (generalized anxiety disorder)    Gastritis 05/07/2011   from NSAID's after foot surgery   GERD (gastroesophageal reflux disease)    Hallux rigidus    Hyperthyroidism     hx graves disease. dr Lalone pcp      Hypothyroidism    (since I-131 treatment)   Osteoporosis 05/07/2007   Tendinitis of right ankle 05/06/2005    PAST SURGICAL HISTORY: Past Surgical History:  Procedure Laterality Date   BREAST EXCISIONAL BIOPSY Right 1994   benign    BREAST SURGERY     fibroadenoma, right   COLONOSCOPY     hallux rigidus Right 11/06/2011   keller arthroplasty Left 06/29/2021   for hallux limitus. Dr. Lara Plants   ROTATOR CUFF REPAIR     right   TONSILLECTOMY      ALLERGIES: Allergies  Allergen Reactions   Codeine  Phosphate Nausea And Vomiting   Latex Itching    senitivity    FAMILY HISTORY:  Family History  Problem Relation Age of Onset   Arthritis Mother        OSTEO, vs poss RA   Hypothyroidism Mother    Ulcers Mother        in esophagus   Vision loss Mother        macular hole   Dementia Mother    Aortic aneurysm Mother    Heart attack Father 42   Colon polyps Father        benign   Heart disease Father        MI at 77; stents and pacemaker 8; CHF   Hypothyroidism Father    Congestive Heart Failure Father    Hypothyroidism Sister    Colon polyps Sister    Depression Sister        poss bipolar   Hypothyroidism Sister        Hashimoto's   Diabetes Brother        obese   Heart disease Brother 41       MI   Deep vein thrombosis Brother    Cancer Maternal Grandmother        leukemia   Cancer Maternal Grandfather        esophageal   Esophageal cancer Maternal Grandfather 42   Osteoporosis Paternal Grandmother    Colon cancer Paternal Grandfather 53   Breast cancer Maternal Aunt    Breast cancer Paternal Aunt    Colon polyps Paternal Aunt    COPD Paternal Aunt    Osteoporosis Paternal Aunt    Osteoporosis Cousin    Deep vein thrombosis Other    Rectal cancer Neg Hx    Stomach cancer Neg Hx     SOCIAL HISTORY: Social History   Socioeconomic History   Marital status: Married    Spouse name: Not on file   Number of children: 3   Years of education: Not on file   Highest education level: Not on file  Occupational History   Occupation: RADIOLOGY Presenter, broadcasting: [employer]  Tobacco Use   Smoking status: Never   Smokeless tobacco: Never  Vaping Use   Vaping status: Never Used  Substance and Sexual Activity   Alcohol use: Yes    Alcohol/week: 0.0 standard drinks of alcohol    Comment: a  few drinks/year (beach trip with girlfriends, wine at holiday)   Drug use: No   Sexual activity: Not Currently    Partners: Male    Birth control/protection: Post-menopausal     Comment: not currently due to husband's health  Other Topics Concern   Not on file  Social History Narrative   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   Married, husband with throat CA.   3 children, all living in the [place] area, 7 grandchildren   Elderly parents now live in [place].  Dad with end stage CHF, mother with dementia      Updated 09/2023   Social Drivers of Health   Financial Resource Strain: Low Risk  (10/02/2023)   Overall Financial Resource Strain (CARDIA)    Difficulty of Paying Living Expenses: Not hard at all  Food Insecurity: No Food Insecurity (10/02/2023)   Hunger Vital Sign    Worried About Running Out of Food in the Last Year: Never true    Ran Out of Food in the Last Year: Never true  Transportation Needs: No Transportation Needs (10/02/2023)   PRAPARE - Administrator, Civil Service (Medical): No    Lack of Transportation (Non-Medical): No  Physical Activity: Sufficiently Active (10/02/2023)   Exercise Vital Sign    Days of Exercise per Week: 5 days    Minutes of Exercise per Session: 80 min  Stress: Stress Concern Present (10/02/2023)   [employer] of Occupational Health - Occupational Stress Questionnaire    Feeling of Stress : Very much  Social Connections: Moderately Integrated (10/02/2023)   Social Connection and Isolation Panel    Frequency of Communication with Friends and Family: More than three times a week    Frequency of Social Gatherings with Friends and Family: More than three times a week    Attends Religious Services: More than 4 times per year    Active Member of [employer] or Organizations: No    Attends Banker Meetings: Never    Marital Status: Married    MEDICATIONS:  Current Outpatient Medications  Medication Sig Dispense Refill   ALPRAZolam  (XANAX ) 0.25 MG tablet Take 1-2 tablets (0.25-0.5 mg total) by mouth 3 (three) times daily as needed for anxiety. 15 tablet 0    azelastine (ASTELIN) 0.1 % nasal spray 1-2 puff in each nostril     B Complex-Folic Acid (B COMPLEX VITAMINS, W/ FA,) CAPS Take by mouth.     calcium carbonate (CALCIUM 600) 600 MG TABS tablet Take 600 mg by mouth daily.     calcium carbonate (TUMS - DOSED IN MG ELEMENTAL CALCIUM) 500 MG chewable tablet Chew 2 tablets by mouth daily.     Cetirizine HCl (ZYRTEC PO) Take 10 mg by mouth at bedtime.     cholecalciferol (VITAMIN D ) 1000 units tablet Take 1,000 Units by mouth daily. 5000 units in the winter     CO ENZYME Q-10 PO Take 100 mg by mouth daily.     Denosumab  (PROLIA  [place]) Inject into the skin every 6 (six) months.     fluticasone (FLONASE) 50 MCG/ACT nasal spray Place 1 spray into both nostrils daily.     Magnesium  400 MG CAPS Take 400 mg by mouth daily.     montelukast (SINGULAIR) 10 MG tablet Take 10 mg by mouth daily.  6   oxybutynin (DITROPAN-XL) 5 MG 24 hr tablet Take 5 mg by mouth daily.     Polyethyl Glycol-Propyl Glycol (SYSTANE OP) Apply  1 drop to eye daily.     PREMARIN vaginal cream 1 applicator.  3   PROAIR HFA 108 (90 Base) MCG/ACT inhaler      sertraline  (ZOLOFT ) 100 MG tablet Take 1 tablet (100 mg total) by mouth daily. 90 tablet 0   TURMERIC PO Take 1 capsule by mouth daily.     vitamin C (ASCORBIC ACID) 500 MG tablet Take 500 mg by mouth daily.     zinc gluconate 50 MG tablet Take 50 mg by mouth daily.     levothyroxine  (SYNTHROID ) 88 MCG tablet Take 1 tablet (88 mcg total) by mouth daily. 90 tablet 3   Current Facility-Administered Medications  Medication Dose Route Frequency Provider Last Rate Last Admin   [START ON 12/30/2023] denosumab  (PROLIA ) injection 60 mg  60 mg Subcutaneous Once Haley Roza, [place], MD        PHYSICAL EXAM: Vitals:   10/16/23 0812  BP: (!) 158/92  Pulse: 75  Resp: 20  SpO2: 97%  Weight: 139 lb (63 kg)  Height: 5' 2.5 (1.588 m)   Body mass index is 25.02 kg/m.  Wt Readings from Last 3 Encounters:  10/16/23 139 lb (63 kg)  10/02/23 137 lb  6.4 oz (62.3 kg)  07/02/23 139 lb 12.8 oz (63.4 kg)    General: Well developed, well nourished female in no apparent distress.  HEENT: AT/[place], no external lesions. Hearing intact to the spoken word Eyes: Conjunctiva clear and no icterus. Neck: Trachea midline, neck supple  Neurologic: Alert, oriented, normal speech Extremities: No pedal pitting edema, no tremors of outstretched hands Skin: Warm, color good.  Psychiatric: Does not appear depressed or anxious  PERTINENT HISTORIC LABORATORY AND IMAGING STUDIES:  All pertinent laboratory results were reviewed. Please see HPI also for further details.   TSH  Date Value Ref Range Status  10/13/2023 0.42 0.40 - 4.50 mIU/L Final  12/27/2022 2.52 0.35 - 5.50 uIU/mL Final  10/17/2022 0.16 (L) 0.35 - 5.50 uIU/mL Final     ASSESSMENT / PLAN  1. Osteoporosis, postmenopausal   2. Postablative hypothyroidism   3. Age-related osteoporosis without current pathological fracture     # Osteoporosis -Patient is currently on Prolia .  She was treated with Reclast  for 6 - 7 years from 2015-2022. -Prolia  was started in August 2023, first dose and continued as every 65-month last injection was in February 2025.. -Continue Prolia  60 mg subcutaneous every 6 months. - DEXA scan is scheduled for October of this year. -Continue current vitamin D  and calcium supplement.  Patient vitamin D  level normal. -Discussed fall precaution and weightbearing exercise.  # Postablative hypothyroidism -Continue levothyroxine  88 mcg daily.  Recent thyroid  function test normal.  Medication renewed.  Lab in 6 months prior to follow-up visit.  Diagnoses and all orders for this visit:  Osteoporosis, postmenopausal  Postablative hypothyroidism -     T4, free -     TSH  Age-related osteoporosis without current pathological fracture  Other orders -     levothyroxine  (SYNTHROID ) 88 MCG tablet; Take 1 tablet (88 mcg total) by mouth daily.     DISPOSITION Follow up  in clinic in 6 months suggested.  All questions answered and patient verbalized understanding of the plan.  [place] Kenyonna Micek, MD [facility] End[facility] [address] [place], [address] [place], [place] [address] Phone # [phone]  At least part of this note was generated using voice recognition software. Inadvertent word errors may have occurred, which were not recognized during the proofreading  process.

## 2023-10-20 DIAGNOSIS — F439 Reaction to severe stress, unspecified: Secondary | ICD-10-CM | POA: Diagnosis not present

## 2023-10-20 DIAGNOSIS — F4381 Prolonged grief disorder: Secondary | ICD-10-CM | POA: Diagnosis not present

## 2023-10-20 DIAGNOSIS — F4323 Adjustment disorder with mixed anxiety and depressed mood: Secondary | ICD-10-CM | POA: Diagnosis not present

## 2023-10-28 DIAGNOSIS — F439 Reaction to severe stress, unspecified: Secondary | ICD-10-CM | POA: Diagnosis not present

## 2023-10-28 DIAGNOSIS — F4323 Adjustment disorder with mixed anxiety and depressed mood: Secondary | ICD-10-CM | POA: Diagnosis not present

## 2023-10-28 DIAGNOSIS — F4381 Prolonged grief disorder: Secondary | ICD-10-CM | POA: Diagnosis not present

## 2023-11-04 DIAGNOSIS — F4323 Adjustment disorder with mixed anxiety and depressed mood: Secondary | ICD-10-CM | POA: Diagnosis not present

## 2023-11-04 DIAGNOSIS — F4381 Prolonged grief disorder: Secondary | ICD-10-CM | POA: Diagnosis not present

## 2023-11-05 DIAGNOSIS — H2513 Age-related nuclear cataract, bilateral: Secondary | ICD-10-CM | POA: Diagnosis not present

## 2023-11-11 DIAGNOSIS — F4323 Adjustment disorder with mixed anxiety and depressed mood: Secondary | ICD-10-CM | POA: Diagnosis not present

## 2023-11-12 NOTE — Progress Notes (Unsigned)
 Start time: End time:  Virtual Visit via Video Note  I connected with Theresa Schneider on 11/12/23 by a video enabled telemedicine application and verified that I am speaking with the correct person using two identifiers.  Location: Patient: *** Provider: office   I discussed the limitations of evaluation and management by telemedicine and the availability of in person appointments. The patient expressed understanding and agreed to proceed.  History of Present Illness:  No chief complaint on file.  Patient presents to follow-up on depression and anxiety.  Sertraline  was titrated up to 100 mg at her physical (PHQ-9 score of 14, GAD-7 score of 9). She was given a refill of xanax  (hadn't been using, couldn't find bottle).  She was having a lot more stress, as parents were both dying (mother with dementia, father with CHF).  Hospice was involved, there is lots of phone calls, lots of stress.  She had been having more trouble sleeping at her physical.   Today she reports ***   She continues to get regular counseling.   PMH, PSH, SH reviewed   ROS:    Observations/Objective:  LMP 01/04/2005   Wt Readings from Last 3 Encounters:  10/16/23 139 lb (63 kg)  10/02/23 137 lb 6.4 oz (62.3 kg)  07/02/23 139 lb 12.8 oz (63.4 kg)         10/02/2023    9:34 AM 04/02/2023   10:45 AM 09/26/2022    8:35 AM 07/22/2022    3:07 PM  GAD 7 : Generalized Anxiety Score  Nervous, Anxious, on Edge 3 3 1 3   Control/stop worrying 2 0 0 3  Worry too much - different things 1 0 0 3  Trouble relaxing 1 0 3 3  Restless 0 0 3 3  Easily annoyed or irritable 1 1 0 3  Afraid - awful might happen 1 0 0 1  Total GAD 7 Score 9 4 7 19   Anxiety Difficulty Not difficult at all Not difficult at all Not difficult at all Very difficult       10/02/2023    9:31 AM 04/02/2023   10:42 AM 09/26/2022    8:33 AM 07/22/2022    3:13 PM 09/19/2021    8:48 AM  Depression screen PHQ 2/9  Decreased Interest 2 1 1  2  0  Down, Depressed, Hopeless 2 0 0 3 0  PHQ - 2 Score 4 1 1 5  0  Altered sleeping 3 1 0 3   Tired, decreased energy 3 3 3 3    Change in appetite 1 0 2 0   Feeling bad or failure about yourself  0 0 0 0   Trouble concentrating 3 0 0 2   Moving slowly or fidgety/restless 0 0 0 0   Suicidal thoughts 0 0 0 0   PHQ-9 Score 14 5 6 13    Difficult doing work/chores Somewhat difficult Not difficult at all Somewhat difficult Very difficult      Assessment and Plan:   PHQ-9 and GAD-7  Follow Up Instructions:    I discussed the assessment and treatment plan with the patient. The patient was provided an opportunity to ask questions and all were answered. The patient agreed with the plan and demonstrated an understanding of the instructions.   The patient was advised to call back or seek an in-person evaluation if the symptoms worsen or if the condition fails to improve as anticipated.  I spent *** minutes dedicated to the care of this patient, including  pre-visit review of records, face to face time, post-visit ordering of testing and documentation.    Theresa DELENA Fetters, MD

## 2023-11-13 ENCOUNTER — Telehealth: Admitting: Family Medicine

## 2023-11-13 ENCOUNTER — Encounter: Payer: Self-pay | Admitting: Family Medicine

## 2023-11-13 VITALS — BP 120/80 | Wt 134.0 lb

## 2023-11-13 DIAGNOSIS — K219 Gastro-esophageal reflux disease without esophagitis: Secondary | ICD-10-CM | POA: Diagnosis not present

## 2023-11-13 DIAGNOSIS — F32A Depression, unspecified: Secondary | ICD-10-CM | POA: Diagnosis not present

## 2023-11-13 DIAGNOSIS — F419 Anxiety disorder, unspecified: Secondary | ICD-10-CM | POA: Diagnosis not present

## 2023-11-13 DIAGNOSIS — Z6379 Other stressful life events affecting family and household: Secondary | ICD-10-CM | POA: Diagnosis not present

## 2023-11-13 NOTE — Patient Instructions (Signed)
 Try and pinpoint triggers for your increased frequency of heartburn. Consider using pepcid prior to triggering foods/meals. You can use pepcid once or twice daily for a couple of weeks to see if that helps (vs Prilosec OTC).

## 2023-11-17 NOTE — Progress Notes (Signed)
 Left message for pt to call and schedule AWV/med check for 10/2024   My Chart message also sent

## 2023-11-18 DIAGNOSIS — F439 Reaction to severe stress, unspecified: Secondary | ICD-10-CM | POA: Diagnosis not present

## 2023-11-18 DIAGNOSIS — F4323 Adjustment disorder with mixed anxiety and depressed mood: Secondary | ICD-10-CM | POA: Diagnosis not present

## 2023-11-18 DIAGNOSIS — F4381 Prolonged grief disorder: Secondary | ICD-10-CM | POA: Diagnosis not present

## 2023-11-25 DIAGNOSIS — F439 Reaction to severe stress, unspecified: Secondary | ICD-10-CM | POA: Diagnosis not present

## 2023-11-25 DIAGNOSIS — F4381 Prolonged grief disorder: Secondary | ICD-10-CM | POA: Diagnosis not present

## 2023-11-25 DIAGNOSIS — F4323 Adjustment disorder with mixed anxiety and depressed mood: Secondary | ICD-10-CM | POA: Diagnosis not present

## 2023-12-02 NOTE — Telephone Encounter (Signed)
 Prolia  VOB initiated via [email or web address]  Next Prolia  inj DUE: 12/31/23

## 2023-12-06 NOTE — Telephone Encounter (Signed)
 [facility] [employer] [facility] [employer]  Patient is ready for scheduling on or after 12/31/23  Out-of-pocket cost due at time of visit: $0  Primary: [place]  Medicare Prolia  co-insurance: 20% (approximately $331.87) Admin fee co-insurance: 20% (approximately $25)  Deductible: $257 of $257 met  Prior Auth: NOT required  Secondary: Aetna Medicare Supplement Plan G Prolia  co-insurance: Covers Medicare Part B co-insurance Admin fee co-insurance: Covers Medicare Part B co-insurance  Deductible: does NOT cover Medicare deductible of which $257 of $257 has been met  Prior Auth: NOT required PA# Valid:   ** This summary of benefits is an estimation of the patient's out-of-pocket cost. Exact cost may vary based on individual plan coverage.

## 2023-12-06 NOTE — Telephone Encounter (Signed)
 [facility] [employer] [facility] [employer] - Prior Authorization NOT required for [facility]

## 2023-12-08 ENCOUNTER — Other Ambulatory Visit: Payer: Self-pay | Admitting: Family Medicine

## 2023-12-08 DIAGNOSIS — Z1231 Encounter for screening mammogram for malignant neoplasm of breast: Secondary | ICD-10-CM

## 2023-12-09 DIAGNOSIS — F4381 Prolonged grief disorder: Secondary | ICD-10-CM | POA: Diagnosis not present

## 2023-12-09 DIAGNOSIS — F439 Reaction to severe stress, unspecified: Secondary | ICD-10-CM | POA: Diagnosis not present

## 2023-12-09 DIAGNOSIS — F4323 Adjustment disorder with mixed anxiety and depressed mood: Secondary | ICD-10-CM | POA: Diagnosis not present

## 2023-12-10 ENCOUNTER — Other Ambulatory Visit: Payer: Medicare Other

## 2023-12-16 DIAGNOSIS — F439 Reaction to severe stress, unspecified: Secondary | ICD-10-CM | POA: Diagnosis not present

## 2023-12-16 DIAGNOSIS — F4381 Prolonged grief disorder: Secondary | ICD-10-CM | POA: Diagnosis not present

## 2023-12-16 DIAGNOSIS — F4323 Adjustment disorder with mixed anxiety and depressed mood: Secondary | ICD-10-CM | POA: Diagnosis not present

## 2023-12-25 ENCOUNTER — Ambulatory Visit
Admission: RE | Admit: 2023-12-25 | Discharge: 2023-12-25 | Disposition: A | Discharge status: Home / Self Care | Source: Ambulatory Visit | Attending: Family Medicine | Admitting: Family Medicine

## 2023-12-25 DIAGNOSIS — Z1231 Encounter for screening mammogram for malignant neoplasm of breast: Secondary | ICD-10-CM

## 2023-12-29 ENCOUNTER — Other Ambulatory Visit: Payer: Self-pay | Admitting: Family Medicine

## 2023-12-29 DIAGNOSIS — F411 Generalized anxiety disorder: Secondary | ICD-10-CM

## 2023-12-30 DIAGNOSIS — F439 Reaction to severe stress, unspecified: Secondary | ICD-10-CM | POA: Diagnosis not present

## 2023-12-30 DIAGNOSIS — F4381 Prolonged grief disorder: Secondary | ICD-10-CM | POA: Diagnosis not present

## 2023-12-30 DIAGNOSIS — F4323 Adjustment disorder with mixed anxiety and depressed mood: Secondary | ICD-10-CM | POA: Diagnosis not present

## 2024-01-02 ENCOUNTER — Other Ambulatory Visit: Payer: Self-pay | Admitting: Family Medicine

## 2024-01-02 ENCOUNTER — Ambulatory Visit (INDEPENDENT_AMBULATORY_CARE_PROVIDER_SITE_OTHER): Payer: Medicare Other

## 2024-01-02 VITALS — BP 100/60 | HR 75 | Resp 16

## 2024-01-02 DIAGNOSIS — M810 Age-related osteoporosis without current pathological fracture: Secondary | ICD-10-CM

## 2024-01-02 DIAGNOSIS — F411 Generalized anxiety disorder: Secondary | ICD-10-CM

## 2024-01-02 MED ORDER — DENOSUMAB 60 MG/ML ~~LOC~~ SOSY: 60.0000 mg | PREFILLED_SYRINGE | Freq: Once | SUBCUTANEOUS | Status: AC

## 2024-01-02 MED ORDER — DENOSUMAB 60 MG/ML ~~LOC~~ SOSY
60.0000 mg | PREFILLED_SYRINGE | Freq: Once | SUBCUTANEOUS | Status: AC
  Administered 2024-01-02: 60 mg via SUBCUTANEOUS

## 2024-01-02 NOTE — Telephone Encounter (Signed)
 Med was increased to 100mg 

## 2024-01-02 NOTE — Progress Notes (Signed)
 After obtaining consent, and per orders of Dr. Erroll Luna, injection of Prolia given by Tera Partridge. Patient instructed to remain in clinic for 20 minutes afterwards, and to report any adverse reaction to me immediately.

## 2024-01-05 NOTE — Telephone Encounter (Signed)
 Last Prolia  inj 01/02/24 Next Prolia  inj due 07/04/24

## 2024-01-06 DIAGNOSIS — F4381 Prolonged grief disorder: Secondary | ICD-10-CM | POA: Diagnosis not present

## 2024-01-06 DIAGNOSIS — F439 Reaction to severe stress, unspecified: Secondary | ICD-10-CM | POA: Diagnosis not present

## 2024-01-06 DIAGNOSIS — F4323 Adjustment disorder with mixed anxiety and depressed mood: Secondary | ICD-10-CM | POA: Diagnosis not present

## 2024-01-13 DIAGNOSIS — F439 Reaction to severe stress, unspecified: Secondary | ICD-10-CM | POA: Diagnosis not present

## 2024-01-13 DIAGNOSIS — F4323 Adjustment disorder with mixed anxiety and depressed mood: Secondary | ICD-10-CM | POA: Diagnosis not present

## 2024-01-13 DIAGNOSIS — F4381 Prolonged grief disorder: Secondary | ICD-10-CM | POA: Diagnosis not present

## 2024-01-27 DIAGNOSIS — F4381 Prolonged grief disorder: Secondary | ICD-10-CM | POA: Diagnosis not present

## 2024-01-27 DIAGNOSIS — F4323 Adjustment disorder with mixed anxiety and depressed mood: Secondary | ICD-10-CM | POA: Diagnosis not present

## 2024-01-27 DIAGNOSIS — F439 Reaction to severe stress, unspecified: Secondary | ICD-10-CM | POA: Diagnosis not present

## 2024-02-10 DIAGNOSIS — F4323 Adjustment disorder with mixed anxiety and depressed mood: Secondary | ICD-10-CM | POA: Diagnosis not present

## 2024-02-10 DIAGNOSIS — F439 Reaction to severe stress, unspecified: Secondary | ICD-10-CM | POA: Diagnosis not present

## 2024-02-10 DIAGNOSIS — F4381 Prolonged grief disorder: Secondary | ICD-10-CM | POA: Diagnosis not present

## 2024-02-11 ENCOUNTER — Other Ambulatory Visit

## 2024-02-17 DIAGNOSIS — F4381 Prolonged grief disorder: Secondary | ICD-10-CM | POA: Diagnosis not present

## 2024-02-17 DIAGNOSIS — F4323 Adjustment disorder with mixed anxiety and depressed mood: Secondary | ICD-10-CM | POA: Diagnosis not present

## 2024-02-17 DIAGNOSIS — F439 Reaction to severe stress, unspecified: Secondary | ICD-10-CM | POA: Diagnosis not present

## 2024-02-19 ENCOUNTER — Other Ambulatory Visit (HOSPITAL_BASED_OUTPATIENT_CLINIC_OR_DEPARTMENT_OTHER)

## 2024-02-24 DIAGNOSIS — F4323 Adjustment disorder with mixed anxiety and depressed mood: Secondary | ICD-10-CM | POA: Diagnosis not present

## 2024-02-24 DIAGNOSIS — F439 Reaction to severe stress, unspecified: Secondary | ICD-10-CM | POA: Diagnosis not present

## 2024-02-24 DIAGNOSIS — F4381 Prolonged grief disorder: Secondary | ICD-10-CM | POA: Diagnosis not present

## 2024-03-01 DIAGNOSIS — H1045 Other chronic allergic conjunctivitis: Secondary | ICD-10-CM | POA: Diagnosis not present

## 2024-03-01 DIAGNOSIS — J3089 Other allergic rhinitis: Secondary | ICD-10-CM | POA: Diagnosis not present

## 2024-03-01 DIAGNOSIS — J3081 Allergic rhinitis due to animal (cat) (dog) hair and dander: Secondary | ICD-10-CM | POA: Diagnosis not present

## 2024-03-01 DIAGNOSIS — J4530 Mild persistent asthma, uncomplicated: Secondary | ICD-10-CM | POA: Diagnosis not present

## 2024-03-02 DIAGNOSIS — F4323 Adjustment disorder with mixed anxiety and depressed mood: Secondary | ICD-10-CM | POA: Diagnosis not present

## 2024-03-02 DIAGNOSIS — F439 Reaction to severe stress, unspecified: Secondary | ICD-10-CM | POA: Diagnosis not present

## 2024-03-02 DIAGNOSIS — F4381 Prolonged grief disorder: Secondary | ICD-10-CM | POA: Diagnosis not present

## 2024-03-05 ENCOUNTER — Other Ambulatory Visit

## 2024-03-09 DIAGNOSIS — F439 Reaction to severe stress, unspecified: Secondary | ICD-10-CM | POA: Diagnosis not present

## 2024-03-09 DIAGNOSIS — F4323 Adjustment disorder with mixed anxiety and depressed mood: Secondary | ICD-10-CM | POA: Diagnosis not present

## 2024-03-09 DIAGNOSIS — F4381 Prolonged grief disorder: Secondary | ICD-10-CM | POA: Diagnosis not present

## 2024-03-16 DIAGNOSIS — F4323 Adjustment disorder with mixed anxiety and depressed mood: Secondary | ICD-10-CM | POA: Diagnosis not present

## 2024-03-16 DIAGNOSIS — F439 Reaction to severe stress, unspecified: Secondary | ICD-10-CM | POA: Diagnosis not present

## 2024-03-16 DIAGNOSIS — F4381 Prolonged grief disorder: Secondary | ICD-10-CM | POA: Diagnosis not present

## 2024-03-23 ENCOUNTER — Telehealth: Payer: Self-pay | Admitting: Endocrinology

## 2024-03-23 DIAGNOSIS — F4381 Prolonged grief disorder: Secondary | ICD-10-CM | POA: Diagnosis not present

## 2024-03-23 DIAGNOSIS — M810 Age-related osteoporosis without current pathological fracture: Secondary | ICD-10-CM

## 2024-03-23 DIAGNOSIS — F439 Reaction to severe stress, unspecified: Secondary | ICD-10-CM | POA: Diagnosis not present

## 2024-03-23 DIAGNOSIS — F4323 Adjustment disorder with mixed anxiety and depressed mood: Secondary | ICD-10-CM | POA: Diagnosis not present

## 2024-03-23 NOTE — Telephone Encounter (Signed)
 Patient is calling to ask if she can get the [facility] [place].  Patient states that due to a family member's health she needs a location closer to her home.

## 2024-03-23 NOTE — Telephone Encounter (Signed)
 Updated bone density order placed.

## 2024-03-26 ENCOUNTER — Other Ambulatory Visit

## 2024-03-30 DIAGNOSIS — F439 Reaction to severe stress, unspecified: Secondary | ICD-10-CM | POA: Diagnosis not present

## 2024-03-30 DIAGNOSIS — F4381 Prolonged grief disorder: Secondary | ICD-10-CM | POA: Diagnosis not present

## 2024-03-30 DIAGNOSIS — F4323 Adjustment disorder with mixed anxiety and depressed mood: Secondary | ICD-10-CM | POA: Diagnosis not present

## 2024-04-06 DIAGNOSIS — F4381 Prolonged grief disorder: Secondary | ICD-10-CM | POA: Diagnosis not present

## 2024-04-06 DIAGNOSIS — F439 Reaction to severe stress, unspecified: Secondary | ICD-10-CM | POA: Diagnosis not present

## 2024-04-06 DIAGNOSIS — F4323 Adjustment disorder with mixed anxiety and depressed mood: Secondary | ICD-10-CM | POA: Diagnosis not present

## 2024-04-13 DIAGNOSIS — F4381 Prolonged grief disorder: Secondary | ICD-10-CM | POA: Diagnosis not present

## 2024-04-13 DIAGNOSIS — F439 Reaction to severe stress, unspecified: Secondary | ICD-10-CM | POA: Diagnosis not present

## 2024-04-13 DIAGNOSIS — F4323 Adjustment disorder with mixed anxiety and depressed mood: Secondary | ICD-10-CM | POA: Diagnosis not present

## 2024-04-14 ENCOUNTER — Other Ambulatory Visit

## 2024-04-14 DIAGNOSIS — E890 Postprocedural hypothyroidism: Secondary | ICD-10-CM | POA: Diagnosis not present

## 2024-04-15 ENCOUNTER — Ambulatory Visit: Payer: Self-pay | Admitting: Endocrinology

## 2024-04-15 LAB — TSH: TSH: 2.7 m[IU]/L (ref 0.40–4.50)

## 2024-04-15 LAB — T4, FREE: Free T4: 1.2 ng/dL (ref 0.8–1.8)

## 2024-04-19 ENCOUNTER — Ambulatory Visit (INDEPENDENT_AMBULATORY_CARE_PROVIDER_SITE_OTHER): Admitting: Endocrinology

## 2024-04-19 ENCOUNTER — Encounter: Payer: Self-pay | Admitting: Endocrinology

## 2024-04-19 VITALS — BP 122/60 | HR 93 | Resp 16 | Ht 63.5 in | Wt 140.2 lb

## 2024-04-19 DIAGNOSIS — E559 Vitamin D deficiency, unspecified: Secondary | ICD-10-CM

## 2024-04-19 DIAGNOSIS — E890 Postprocedural hypothyroidism: Secondary | ICD-10-CM

## 2024-04-19 DIAGNOSIS — M810 Age-related osteoporosis without current pathological fracture: Secondary | ICD-10-CM

## 2024-04-19 NOTE — Progress Notes (Signed)
 Outpatient Endocrinology Note Shardea Cwynar, MD  04/19/2024  Patient's Name: Theresa Schneider    DOB: 11/12/1955    MRN: [id number]  REASON OF VISIT: Follow-up for hypothyroidism and osteoporosis.  PCP: Randol Dawes, MD  HISTORY OF PRESENT ILLNESS:   Theresa Schneider is a 68 y.o. old female with past medical history as listed below is presented for a follow up  for hypothyroidism and osteoporosis.   Pertinent History: # Osteoporosis history: She has previously been evaluated and treated by her gynecologist since about 2004. She was given a trial of Actonel about 20 + years ago. She  had difficulty tolerating this because of abdominal discomfort and not clear if she took this regularly, probably not over 2 years. She was subsequently given Prolia  starting in about 2012 and she got only 3 injections. The injection was stopped because of lack of insurance coverage. She was started on Reclast  in 10/2013 when her bone density done by her gynecologist on 09/21/13 showed a T score of -3.5 at the lumbar spine. Subsequently she has had a total of 6 Reclast  infusions. Last infusion was 01/01/2021 with a gap of 2 years.   Since her bone density was showing slight decrease at the spine in 09/2021 she is now taking PROLIA  with the first injection on 12/19/2021 and every 6 months. No side effects with the Prolia .    Results from bone density of 09/18/2021 with comparisons as below   T-score AP Spine  L1-L4      09/18/2021    67.8         -2.5    0.886 g/cm2 AP Spine  L1-L4      01/14/2019    65.2         -2.3    0.904 g/cm2   DualFemur Neck Left  09/18/2021    67.8         -2.0    0.766 g/cm2 DualFemur Neck Left  01/14/2019    65.2         -1.8    0.781 g/cm2   DualFemur Total Mean 09/18/2021    67.8         -1.4    0.830 g/cm2 DualFemur Total Mean 01/14/2019    65.2         -1.5    0.815 g/cm2     Vit D 5000 units daily. Calcium 600 mg daily. Cheese mostly.   # Postablative hypothyroidism -Patient had  radioactive iodine ablation several years ago for Graves' disease.  She has been on thyroid  hormone replacement since then.  She was to be on brand-name Synthroid  from Synthroid  direct program.  She has been now taking generic levothyroxine .  She has required periodic dose adjustment in the past.   Interval history Prolia  injection was completed in August.  She has no fall and fracture.  She has been taking calcium and vitamin D  supplement, reports lately she has been missing few times.  She has been taking levothyroxine  88 mcg daily.  Recent thyroid  function is normal.  Denies palpitation and heat intolerance.  She is on her usual state of health.  No new complaints.    Latest Reference Range & Units 04/14/24 08:10  TSH 0.40 - 4.50 mIU/L 2.70  T4,Free(Direct) 0.8 - 1.8 ng/dL 1.2    REVIEW OF SYSTEMS:  As per history of present illness.   PAST MEDICAL HISTORY: Past Medical History:  Diagnosis Date   Allergy    SEASONAL/PERENNIAL  Anxiety    Arthritis    Asthma    allergy related   Depression    GAD (generalized anxiety disorder)    Gastritis 05/07/2011   from NSAID's after foot surgery   GERD (gastroesophageal reflux disease)    Hallux rigidus    Hyperthyroidism     hx graves disease. dr Lalone pcp      Hypothyroidism    (since I-131 treatment)   Osteoporosis 05/07/2007   Tendinitis of right ankle 05/06/2005    PAST SURGICAL HISTORY: Past Surgical History:  Procedure Laterality Date   BREAST EXCISIONAL BIOPSY Right 1994   benign    BREAST SURGERY     fibroadenoma, right   COLONOSCOPY     hallux rigidus Right 11/06/2011   keller arthroplasty Left 06/29/2021   for hallux limitus. Dr. Verta   ROTATOR CUFF REPAIR     right   TONSILLECTOMY      ALLERGIES: Allergies  Allergen Reactions   Codeine Phosphate Nausea And Vomiting   Latex Itching    senitivity    FAMILY HISTORY:  Family History  Problem Relation Age of Onset   Arthritis Mother        OSTEO, vs  poss RA   Hypothyroidism Mother    Ulcers Mother        in esophagus   Vision loss Mother        macular hole   Dementia Mother    Aortic aneurysm Mother    Heart attack Father 19   Colon polyps Father        benign   Heart disease Father        MI at 30; stents and pacemaker 85; CHF   Hypothyroidism Father    Congestive Heart Failure Father    Hypothyroidism Sister    Colon polyps Sister    Depression Sister        poss bipolar   Hypothyroidism Sister        Hashimoto's   Diabetes Brother        obese   Heart disease Brother 73       MI   Deep vein thrombosis Brother    Cancer Maternal Grandmother        leukemia   Cancer Maternal Grandfather        esophageal   Esophageal cancer Maternal Grandfather 90   Osteoporosis Paternal Grandmother    Colon cancer Paternal Grandfather 21   Breast cancer Maternal Aunt    Breast cancer Paternal Aunt    Colon polyps Paternal Aunt    COPD Paternal Aunt    Osteoporosis Paternal Aunt    Osteoporosis Cousin    Deep vein thrombosis Other    Rectal cancer Neg Hx    Stomach cancer Neg Hx     SOCIAL HISTORY: Social History   Socioeconomic History   Marital status: Married    Spouse name: Not on file   Number of children: 3   Years of education: Not on file   Highest education level: Not on file  Occupational History   Occupation: RADIOLOGY Presenter, Broadcasting: [employer]  Tobacco Use   Smoking status: Never   Smokeless tobacco: Never  Vaping Use   Vaping status: Never Used  Substance and Sexual Activity   Alcohol use: Yes    Alcohol/week: 0.0 standard drinks of alcohol    Comment: a few drinks/year (beach trip with girlfriends, wine at holiday)   Drug use: No   Sexual activity:  Not Currently    Partners: Male    Birth control/protection: Post-menopausal    Comment: not currently due to husband's health  Other Topics Concern   Not on file  Social History Narrative   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   Married, husband with throat CA.   3 children, all living in the [place] area, 7 grandchildren   Elderly parents now live in [place].  Dad with end stage CHF, mother with dementia      Updated 09/2023   Social Drivers of Health   Tobacco Use: Low Risk (04/19/2024)   Patient History    Smoking Tobacco Use: Never    Smokeless Tobacco Use: Never    Passive Exposure: Not on file  Financial Resource Strain: Low Risk (10/02/2023)   Overall Financial Resource Strain (CARDIA)    Difficulty of Paying Living Expenses: Not hard at all  Food Insecurity: No Food Insecurity (10/02/2023)   Hunger Vital Sign    Worried About Running Out of Food in the Last Year: Never true    Ran Out of Food in the Last Year: Never true  Transportation Needs: No Transportation Needs (10/02/2023)   PRAPARE - Administrator, Civil Service (Medical): No    Lack of Transportation (Non-Medical): No  Physical Activity: Sufficiently Active (10/02/2023)   Exercise Vital Sign    Days of Exercise per Week: 5 days    Minutes of Exercise per Session: 80 min  Stress: Stress Concern Present (10/02/2023)   [employer] of Occupational Health - Occupational Stress Questionnaire    Feeling of Stress : Very much  Social Connections: Moderately Integrated (10/02/2023)   Social Connection and Isolation Panel    Frequency of Communication with Friends and Family: More than three times a week    Frequency of Social Gatherings with Friends and Family: More than three times a week    Attends Religious Services: More than 4 times per year    Active Member of Clubs or Organizations: No    Attends Banker Meetings: Never    Marital Status: Married  Depression (PHQ2-9): Medium Risk (11/13/2023)   Depression (PHQ2-9)    PHQ-2 Score: 6  Alcohol Screen: Not on file  Housing: Low Risk (10/02/2023)   Housing Stability Vital Sign    Unable to Pay for Housing in the Last Year: No    Number of  Times Moved in the Last Year: 0    Homeless in the Last Year: No  Utilities: Not At Risk (10/02/2023)   AHC Utilities    Threatened with loss of utilities: No  Health Literacy: Not on file    MEDICATIONS:  Current Outpatient Medications  Medication Sig Dispense Refill   ALPRAZolam  (XANAX ) 0.25 MG tablet Take 1-2 tablets (0.25-0.5 mg total) by mouth 3 (three) times daily as needed for anxiety. 15 tablet 0   azelastine (ASTELIN) 0.1 % nasal spray 1-2 puff in each nostril     B Complex-Folic Acid (B COMPLEX VITAMINS, W/ FA,) CAPS Take by mouth.     calcium carbonate (CALCIUM 600) 600 MG TABS tablet Take 600 mg by mouth daily.     calcium carbonate (TUMS - DOSED IN MG ELEMENTAL CALCIUM) 500 MG chewable tablet Chew 2 tablets by mouth daily. (Patient taking differently: Chew 2 tablets by mouth daily. Every other night)     Cetirizine HCl (ZYRTEC PO) Take 10 mg by mouth at bedtime.     cholecalciferol (VITAMIN  D) 1000 units tablet Take 1,000 Units by mouth daily. 5000 units in the winter     CO ENZYME Q-10 PO Take 100 mg by mouth daily.     Denosumab  (PROLIA  Espy) Inject into the skin every 6 (six) months.     fluticasone (FLONASE) 50 MCG/ACT nasal spray Place 1 spray into both nostrils daily.     levothyroxine  (SYNTHROID ) 88 MCG tablet Take 1 tablet (88 mcg total) by mouth daily. 90 tablet 3   Magnesium  400 MG CAPS Take 400 mg by mouth daily.     montelukast (SINGULAIR) 10 MG tablet Take 10 mg by mouth daily.  6   oxybutynin (DITROPAN-XL) 5 MG 24 hr tablet Take 5 mg by mouth daily.     Polyethyl Glycol-Propyl Glycol (SYSTANE OP) Apply 1 drop to eye daily. (Patient taking differently: Apply 1 drop to eye daily. prn)     PREMARIN vaginal cream 1 applicator.  3   PROAIR HFA 108 (90 Base) MCG/ACT inhaler      sertraline  (ZOLOFT ) 100 MG tablet TAKE 1 TABLET BY MOUTH DAILY 90 tablet 2   TURMERIC PO Take 1 capsule by mouth daily.     vitamin C (ASCORBIC ACID) 500 MG tablet Take 500 mg by mouth daily.      zinc gluconate 50 MG tablet Take 50 mg by mouth daily.     Current Facility-Administered Medications  Medication Dose Route Frequency Provider Last Rate Last Admin   denosumab  (PROLIA ) injection 60 mg  60 mg Subcutaneous Once Campbell Kray, MD       [START ON 07/03/2024] denosumab  (PROLIA ) injection 60 mg  60 mg Subcutaneous Once Jadyn Brasher, MD        PHYSICAL EXAM: Vitals:   04/19/24 0814  BP: 122/60  Pulse: 93  Resp: 16  SpO2: 98%  Weight: 140 lb 3.2 oz (63.6 kg)  Height: 5' 3.5 (1.613 m)   Body mass index is 24.45 kg/m.  Wt Readings from Last 3 Encounters:  04/19/24 140 lb 3.2 oz (63.6 kg)  11/13/23 134 lb (60.8 kg)  10/16/23 139 lb (63 kg)    General: Well developed, well nourished female in no apparent distress.  HEENT: AT/[place], no external lesions. Hearing intact to the spoken word Eyes: Conjunctiva clear and no icterus. Neck: Trachea midline, neck supple  Neurologic: Alert, oriented, normal speech Extremities: No pedal pitting edema, no tremors of outstretched hands Skin: Warm, color good.  Psychiatric: Does not appear depressed or anxious  PERTINENT HISTORIC LABORATORY AND IMAGING STUDIES:  All pertinent laboratory results were reviewed. Please see HPI also for further details.   TSH  Date Value Ref Range Status  04/14/2024 2.70 0.40 - 4.50 mIU/L Final  10/13/2023 0.42 0.40 - 4.50 mIU/L Final  12/27/2022 2.52 0.35 - 5.50 uIU/mL Final     ASSESSMENT / PLAN  1. Age-related osteoporosis without current pathological fracture   2. Postablative hypothyroidism   3. Vitamin D  deficiency     # Osteoporosis -Patient is currently on Prolia .  She was treated with Reclast  for 6 - 7 years from 2015-2022. -Prolia  was started in August 2023, first dose and continued as every 13-month last injection was in August 2025. -Continue Prolia  60 mg subcutaneous every 6 months.  Next dose is scheduled for March 2026. - DEXA scan is scheduled for January 2026, will follow-up  when completed. -Continue current vitamin D  and calcium supplement.  Patient vitamin D  level normal in June 2025. -Discussed fall precaution and weightbearing exercise.  #  Postablative hypothyroidism -Continue levothyroxine  88 mcg daily.  Recent thyroid  function test normal.  Medication renewed.  Lab in 6 months prior to follow-up visit.  Diagnoses and all orders for this visit:  Age-related osteoporosis without current pathological fracture -     TSH -     VITAMIN D  25 Hydroxy (Vit-D Deficiency, Fractures) -     Renal function panel  Postablative hypothyroidism -     T4, free -     TSH  Vitamin D  deficiency -     VITAMIN D  25 Hydroxy (Vit-D Deficiency, Fractures)      DISPOSITION Follow up in clinic in 6 months suggested.  Labs prior to follow-up visit as ordered.  All questions answered and patient verbalized understanding of the plan.  Jillianne Gamino, MD [facility] Endocrinology [facility] [address] [place], [address] [place], [place] [address] Phone # [phone]  At least part of this note was generated using voice recognition software. Inadvertent word errors may have occurred, which were not recognized during the proofreading process.

## 2024-04-20 DIAGNOSIS — F4381 Prolonged grief disorder: Secondary | ICD-10-CM | POA: Diagnosis not present

## 2024-04-20 DIAGNOSIS — F439 Reaction to severe stress, unspecified: Secondary | ICD-10-CM | POA: Diagnosis not present

## 2024-04-20 DIAGNOSIS — F4323 Adjustment disorder with mixed anxiety and depressed mood: Secondary | ICD-10-CM | POA: Diagnosis not present

## 2024-05-26 ENCOUNTER — Ambulatory Visit

## 2024-05-26 DIAGNOSIS — M810 Age-related osteoporosis without current pathological fracture: Secondary | ICD-10-CM | POA: Diagnosis not present

## 2024-05-28 ENCOUNTER — Ambulatory Visit: Payer: Self-pay | Admitting: Endocrinology

## 2024-06-07 NOTE — Telephone Encounter (Signed)
 Prolia  VOB initiated via [email or web address]  Next Prolia  inj DUE: 07/04/24

## 2024-06-08 NOTE — Telephone Encounter (Signed)
 [facility] [employer]  Patient is ready for scheduling on or after 07/04/24  Out-of-pocket cost due at time of visit: $0  Primary:   Medicare Prolia  co-insurance: 20% (approximately $352.56) Admin fee co-insurance: 20% (approximately $25)  Deductible: $283 of $283 met  Prior Auth: NOT required  Secondary: Aetna Medicare Supplement Plan G Prolia  co-insurance: Covers Medicare Part B co-insurance Admin fee co-insurance: Covers Medicare Part B co-insurance  Deductible: does NOT cover Medicare deductible of which [id number] of $283 has been met  Prior Auth: NOT required PA# Valid:   ** This summary of benefits is an estimation of the patient's out-of-pocket cost. Exact cost may vary based on individual plan coverage.

## 2024-07-09 ENCOUNTER — Ambulatory Visit

## 2024-10-07 ENCOUNTER — Ambulatory Visit: Admitting: Family Medicine

## 2024-10-18 ENCOUNTER — Other Ambulatory Visit

## 2024-10-21 ENCOUNTER — Ambulatory Visit: Admitting: Endocrinology

## 2024-12-02 ENCOUNTER — Ambulatory Visit: Payer: Self-pay | Admitting: Family Medicine
